# Patient Record
Sex: Female | Born: 1935 | Race: White | Hispanic: No | Marital: Single | Smoking: Never smoker
Health system: Southern US, Community
[De-identification: ages and names within clinical notes are randomized; demographics above are authoritative.]

## PROBLEM LIST (undated history)

## (undated) DIAGNOSIS — H269 Unspecified cataract: Secondary | ICD-10-CM

## (undated) DIAGNOSIS — F419 Anxiety disorder, unspecified: Secondary | ICD-10-CM

## (undated) DIAGNOSIS — I10 Essential (primary) hypertension: Secondary | ICD-10-CM

## (undated) DIAGNOSIS — I5030 Unspecified diastolic (congestive) heart failure: Secondary | ICD-10-CM

## (undated) DIAGNOSIS — I4891 Unspecified atrial fibrillation: Secondary | ICD-10-CM

## (undated) HISTORY — DX: Unspecified cataract: H26.9

## (undated) HISTORY — PX: CATARACT EXTRACTION: SUR2

## (undated) HISTORY — DX: Essential (primary) hypertension: I10

## (undated) HISTORY — DX: Anxiety disorder, unspecified: F41.9

---

## 2005-04-17 ENCOUNTER — Ambulatory Visit: Payer: Self-pay | Admitting: Ophthalmology

## 2017-07-21 ENCOUNTER — Emergency Department
Admission: EM | Admit: 2017-07-21 | Discharge: 2017-07-21 | Disposition: A | Discharge status: Home / Self Care | Payer: Medicare Other | Attending: Emergency Medicine | Admitting: Emergency Medicine

## 2017-07-21 ENCOUNTER — Emergency Department: Payer: Medicare Other

## 2017-07-21 ENCOUNTER — Encounter: Payer: Self-pay | Admitting: Emergency Medicine

## 2017-07-21 DIAGNOSIS — Z87891 Personal history of nicotine dependence: Secondary | ICD-10-CM | POA: Insufficient documentation

## 2017-07-21 DIAGNOSIS — I10 Essential (primary) hypertension: Secondary | ICD-10-CM | POA: Diagnosis not present

## 2017-07-21 DIAGNOSIS — R42 Dizziness and giddiness: Secondary | ICD-10-CM | POA: Diagnosis present

## 2017-07-21 LAB — URINALYSIS, COMPLETE (UACMP) WITH MICROSCOPIC
BACTERIA UA: NONE SEEN
Bilirubin Urine: NEGATIVE
Glucose, UA: NEGATIVE mg/dL
Hgb urine dipstick: NEGATIVE
Ketones, ur: NEGATIVE mg/dL
Nitrite: NEGATIVE
PROTEIN: NEGATIVE mg/dL
SPECIFIC GRAVITY, URINE: 1.006 (ref 1.005–1.030)
pH: 7 (ref 5.0–8.0)

## 2017-07-21 LAB — BASIC METABOLIC PANEL
ANION GAP: 10 (ref 5–15)
BUN: 23 mg/dL — ABNORMAL HIGH (ref 6–20)
CALCIUM: 10.1 mg/dL (ref 8.9–10.3)
CHLORIDE: 104 mmol/L (ref 101–111)
CO2: 25 mmol/L (ref 22–32)
Creatinine, Ser: 0.81 mg/dL (ref 0.44–1.00)
GFR calc Af Amer: >60 mL/min (ref >60)
GFR calc non Af Amer: >60 mL/min (ref >60)
GLUCOSE: 123 mg/dL — AB (ref 65–99)
POTASSIUM: 3.7 mmol/L (ref 3.5–5.1)
Sodium: 139 mmol/L (ref 135–145)

## 2017-07-21 LAB — CBC
HEMATOCRIT: 43.7 % (ref 35.0–47.0)
HEMOGLOBIN: 15 g/dL (ref 12.0–16.0)
MCH: 30.8 pg (ref 26.0–34.0)
MCHC: 34.3 g/dL (ref 32.0–36.0)
MCV: 89.6 fL (ref 80.0–100.0)
Platelets: 184 10*3/uL (ref 150–440)
RBC: 4.88 MIL/uL (ref 3.80–5.20)
RDW: 14.3 % (ref 11.5–14.5)
WBC: 6.9 10*3/uL (ref 3.6–11.0)

## 2017-07-21 MED ORDER — HYDROCHLOROTHIAZIDE 25 MG PO TABS
25.0000 mg | ORAL_TABLET | Freq: Every day | ORAL | Status: DC
  Administered 2017-07-21: 25 mg via ORAL
  Filled 2017-07-21: qty 1

## 2017-07-21 MED ORDER — HYDROCHLOROTHIAZIDE 25 MG PO TABS: 25.0000 mg | ORAL_TABLET | Freq: Every day | ORAL | 1 refills | Status: DC

## 2017-07-21 NOTE — ED Provider Notes (Signed)
[facility] Emergency Department Provider Note   ____________________________________________   First MD Initiated Contact with Patient 07/21/17 Bosie Helper     (approximate)  I have reviewed the triage vital signs and the nursing notes.   HISTORY  Chief Complaint Dizziness  HPI Marissa Gilmore is a 83 y.o. female Comes over from [facility]. She has been having episodes of dizziness lightheadedness and unsteadiness. Her blood pressure was well over 200 and so she came here. After resting here her blood pressure went down to 199/124. Patient had more dizziness Sunday she said. Really not much dizziness today.. She is not dizzy now. .She has no headache she has no chest pain. She has no nausea or vomiting. She feels well at present he has not been to the doctor formany years   History reviewed. No pertinent past medical history.  There are no active problems to display for this patient.   Past Surgical History:  Procedure Laterality Date  . EYE SURGERY      Prior to Admission medications   Medication Sig Start Date End Date Taking? Authorizing Provider  hydrochlorothiazide (HYDRODIURIL) 25 MG tablet Take 1 tablet (25 mg total) by mouth daily. 07/21/17   Nena Polio, MD    Allergies Patient has no known allergies.  No family history on file.  Social History Social History  Substance Use Topics  . Smoking status: Former Research scientist (life sciences)  . Smokeless tobacco: Never Used  . Alcohol use No    Review of Systems  Constitutional: No fever/chills Eyes: No visual changes. ENT: No sore throat. Cardiovascular: Denies chest pain. Respiratory: Denies shortness of breath. Gastrointestinal: No abdominal pain.  No nausea, no vomiting.  No diarrhea.  No constipation. Genitourinary: Negative for dysuria. Musculoskeletal: Negative for back pain. Skin: Negative for rash. Neurological: Negative for headaches, focal weakness or  numbness.   ____________________________________________   PHYSICAL EXAM:  VITAL SIGNS: ED Triage Vitals [07/21/17 1611]  Enc Vitals Group     BP (!) 212/77     Pulse Rate 97     Resp 17     Temp 98.6 F (37 C)     Temp Source Oral     SpO2 99 %     Weight 148 lb (67.1 kg)     Height 5\' 4"  (1.626 m)     Head Circumference      Peak Flow      Pain Score      Pain Loc      Pain Edu?      Excl. in [place]?     Constitutional: Alert and oriented. Well appearing and in no acute distress. Eyes: Conjunctivae are normal. PERRL. EOMI.unable to see fundi Head: Atraumatic. Nose: No congestion/rhinnorhea. Mouth/Throat: Mucous membranes are moist.  Oropharynx non-erythematous. Neck: No stridor.  Cardiovascular: Normal rate, regular rhythm. Grossly normal heart sounds.  Good peripheral circulation. Respiratory: Normal respiratory effort.  No retractions. Lungs CTAB. Gastrointestinal: Soft and nontender. No distention. No abdominal bruits. No CVA tenderness. Musculoskeletal: No lower extremity tenderness nor edema.  No joint effusions. Neurologic:  Normal speech and language. No gross focal neurologic deficits are appreciated. No gait instability. Skin:  Skin is warm, dry and intact. No rash noted. Psychiatric: Mood and affect are normal. Speech and behavior are normal.  ____________________________________________   LABS (all labs ordered are listed, but only abnormal results are displayed)  Labs Reviewed  BASIC METABOLIC PANEL - Abnormal; Notable for the following:  Result Value   Glucose, Bld 123 (*)    BUN 23 (*)    All other components within normal limits  URINALYSIS, COMPLETE (UACMP) WITH MICROSCOPIC - Abnormal; Notable for the following:    Color, Urine STRAW (*)    APPearance CLEAR (*)    Leukocytes, UA MODERATE (*)    Squamous Epithelial / LPF 0-5 (*)    All other components within normal limits  CBC  CBG MONITORING, ED    ____________________________________________  EKG  EKG read and interpreted by me shows normal sinus rhythm at 97. Normal axis PVCs and LVH ____________________________________________  RADIOLOGY  Ct Head Wo Contrast  Result Date: 07/21/2017 CLINICAL DATA:  High blood pressure and dizziness intermittently for several weeks EXAM: CT HEAD WITHOUT CONTRAST TECHNIQUE: Contiguous axial images were obtained from the base of the skull through the vertex without intravenous contrast. COMPARISON:  None. FINDINGS: Brain: No acute territorial infarction, hemorrhage, or intracranial mass is seen. Small focal hypodensities within the bilateral Kempker matter may reflect old lacunar infarcts. Patchy subcortical and periventricular Raby matter hypodensity consistent with small vessel change. Mild atrophy. Nonenlarged ventricles. Vascular: No hyperdense vessels.  Carotid artery calcification. Skull: No fracture or suspicious focal lesion Sinuses/Orbits: No acute finding. Other: None IMPRESSION: 1. No CT evidence for acute intracranial abnormality. 2. Mild small vessel ischemic changes of the Saiki matter and atrophy. Electronically Signed   By: Donavan Foil M.D.   On: 07/21/2017 19:19    ____________________________________________   PROCEDURES  Procedure(s) performed:  Procedures  Critical Care performed:   ____________________________________________   INITIAL IMPRESSION / ASSESSMENT AND PLAN / ED COURSE  Pertinent labs & imaging results that were available during my care of the patient were reviewed by me and considered in my medical decision making (see chart for details).        ____________________________________________   FINAL CLINICAL IMPRESSION(S) / ED DIAGNOSES  Final diagnoses:  Hypertension, unspecified type      NEW MEDICATIONS STARTED DURING THIS VISIT:  New Prescriptions   HYDROCHLOROTHIAZIDE (HYDRODIURIL) 25 MG TABLET    Take 1 tablet (25 mg total) by mouth  daily.     Note:  This document was prepared using Dragon voice recognition software and may include unintentional dictation errors.    Nena Polio, MD 07/21/17 2036

## 2017-07-21 NOTE — ED Notes (Addendum)
Blood pressure rechecked and was 225/57 highest pulse ox reading was 102

## 2017-07-21 NOTE — Discharge Instructions (Signed)
Take the hydrochlorothiazide 1 pill a Day. Rest and take it easy tomorrow and the next day.  Follow-up with her know to clinic acute care in a few days. Return if you have more dizziness and return at once if you have any other symptoms like a bad headache or numbness or weakness

## 2017-07-21 NOTE — ED Triage Notes (Signed)
Pt sent  Over from [facility] for further eval of high blood pressure and dizziness that has been going on for a couple of weeks off and on.

## 2017-07-29 ENCOUNTER — Encounter: Payer: Self-pay | Admitting: Family Medicine

## 2017-07-29 ENCOUNTER — Ambulatory Visit (INDEPENDENT_AMBULATORY_CARE_PROVIDER_SITE_OTHER): Payer: Medicare Other | Admitting: Family Medicine

## 2017-07-29 VITALS — BP 190/96 | HR 95 | Temp 98.2°F | Ht 64.0 in | Wt 142.0 lb

## 2017-07-29 DIAGNOSIS — I10 Essential (primary) hypertension: Secondary | ICD-10-CM

## 2017-07-29 DIAGNOSIS — I119 Hypertensive heart disease without heart failure: Secondary | ICD-10-CM | POA: Diagnosis not present

## 2017-07-29 MED ORDER — LISINOPRIL 2.5 MG PO TABS: 2.5000 mg | ORAL_TABLET | Freq: Every day | ORAL | 1 refills | Status: DC

## 2017-07-29 NOTE — Progress Notes (Signed)
Patient: Marissa Gilmore Female    DOB: 1930-12-07   83 y.o.   MRN: [id number] Visit Date: 07/29/2017  Today's Provider: Vernie Murders, PA   Chief Complaint  Patient presents with  . Hospitalization Follow-up   Subjective:    HPI  Marissa Gilmore is a 83 year old female who presents today to [facility] as a new patient. She will be seeing Dr. Brita Romp as a new patient, but needed a ER follow up before a new patient appointment was available with her.     Follow up ER visit Patient was seen in ER for dizziness, lightheadedness, unsteadiness, and elevated blood pressure on 07/21/17. She was treated for Hypertension. Treatment for this included started HCTZ 25 mg She reports good compliance with treatment. She reports this condition is slightly Improved.  ------------------------------------------------------------------------------------  Past Medical History:  Diagnosis Date  . Anxiety   . Cataract   . Hypertension    Past Surgical History:  Procedure Laterality Date  . EYE SURGERY     Family History  Problem Relation Age of Onset  . Heart disease Mother   . Bladder Cancer Father    No Known Allergies   Previous Medications   HYDROCHLOROTHIAZIDE (HYDRODIURIL) 25 MG TABLET    Take 1 tablet (25 mg total) by mouth daily.    Review of Systems  Constitutional: Positive for activity change and appetite change.  Respiratory: Negative.   Cardiovascular: Negative.     Social History  Substance Use Topics  . Smoking status: Former Research scientist (life sciences)  . Smokeless tobacco: Never Used  . Alcohol use No   Objective:   BP (!) 200/94 (BP Location: Right Arm, Patient Position: Sitting, Cuff Size: Normal)   Pulse 95   Temp 98.2 F (36.8 C) (Oral)   Ht 5\' 4"  (1.626 m)   Wt 142 lb (64.4 kg)   SpO2 98%   BMI 24.37 kg/m   Physical Exam  Constitutional: She is oriented to person, place, and time. She appears well-developed and well-nourished.  HENT:  Head: Normocephalic and  atraumatic.  Right Ear: External ear normal.  Left Ear: External ear normal.  Nose: Nose normal.  Mouth/Throat: Oropharynx is clear and moist.  Eyes: Pupils are equal, round, and reactive to light. Conjunctivae and EOM are normal. Right eye exhibits no discharge.  Neck: Normal range of motion. Neck supple. No tracheal deviation present. No thyromegaly present.  Cardiovascular: Normal rate, regular rhythm, normal heart sounds and intact distal pulses.   No murmur heard. Pulmonary/Chest: Effort normal and breath sounds normal. No respiratory distress. She has no wheezes. She has no rales. She exhibits no tenderness.  Abdominal: Soft. She exhibits no distension and no mass. There is no tenderness. There is no rebound and no guarding.  Musculoskeletal: Normal range of motion. She exhibits no edema or tenderness.  Prominent/enlarged right first Steen joint. Good grip and fair ROM throughout all joints. No edema. Pulses symmetric.  Lymphadenopathy:    She has no cervical adenopathy.  Neurological: She is alert and oriented to person, place, and time. She has normal reflexes. No cranial nerve deficit. She exhibits normal muscle tone. Coordination normal.  Skin: Skin is warm and dry. No rash noted. No erythema.  Psychiatric: She has a normal mood and affect. Her behavior is normal. Judgment and thought content normal.      Assessment & Plan:     1. Essential hypertension Developed light headed sensation over the past month and went to the [facility] outpatient  clinic on 07-21-17. Was transferred to the ER by wheelchair because of increase in heart rate and found to have BP 212/77 with pulse 97. EKG showed PVC's and LVH. CT scan of head was negative for acute intracranial abnormality. Creatinine was 0.81, glucose 123, K+ 3.7, Na+ 139, Cl 104, CO2 25, anion gap 10 with GFR >60. She was started on HCTZ 25 mg qd and find BP only slightly better. With LVH and persistent BP elevation (200/94 today), will add  Lisinopril 2.5 mg qd. Recheck BP in 2 weeks. Given recommendations to restrict sodium intake, lower fats in diet and drink adequate amounts of water. States she is feeling better today than when she went to the ER.  - lisinopril (PRINIVIL,ZESTRIL) 2.5 MG tablet; Take 1 tablet (2.5 mg total) by mouth daily.  Dispense: 30 tablet; Refill: 1  2. Hypertensive left ventricular hypertrophy, without heart failure Rare PVC today with no chest pain, dyspnea, diaphoresis or peripheral edema. EKG's in the ER on 07-21-17 showed HR 97 with sinus rhythm, frequent PVC's and LVH. Feeling better today. Will add small dose of ACE-I and recommend she proceed with follow up appointment with Dr. Brita Romp. - lisinopril (PRINIVIL,ZESTRIL) 2.5 MG tablet; Take 1 tablet (2.5 mg total) by mouth daily.  Dispense: 30 tablet; Refill: 1

## 2017-07-30 ENCOUNTER — Ambulatory Visit: Payer: Self-pay | Admitting: Physician Assistant

## 2017-08-22 ENCOUNTER — Ambulatory Visit (INDEPENDENT_AMBULATORY_CARE_PROVIDER_SITE_OTHER): Payer: Medicare Other | Admitting: Family Medicine

## 2017-08-22 ENCOUNTER — Ambulatory Visit: Payer: Self-pay | Admitting: Family Medicine

## 2017-08-22 ENCOUNTER — Encounter: Payer: Self-pay | Admitting: Family Medicine

## 2017-08-22 VITALS — BP 176/82 | HR 100 | Temp 98.2°F | Resp 16 | Ht 64.0 in | Wt 141.0 lb

## 2017-08-22 DIAGNOSIS — I10 Essential (primary) hypertension: Secondary | ICD-10-CM | POA: Insufficient documentation

## 2017-08-22 DIAGNOSIS — R5383 Other fatigue: Secondary | ICD-10-CM

## 2017-08-22 DIAGNOSIS — I119 Hypertensive heart disease without heart failure: Secondary | ICD-10-CM | POA: Diagnosis not present

## 2017-08-22 LAB — LIPID PANEL
Cholesterol: 272 mg/dL — ABNORMAL HIGH (ref <200)
HDL: 64 mg/dL (ref >50)
LDL CHOLESTEROL (CALC): 175 mg/dL — AB
Non-HDL Cholesterol (Calc): 208 mg/dL (calc) — ABNORMAL HIGH (ref <130)
Total CHOL/HDL Ratio: 4.3 (calc) (ref <5.0)
Triglycerides: 180 mg/dL — ABNORMAL HIGH (ref <150)

## 2017-08-22 LAB — COMPLETE METABOLIC PANEL WITH GFR
AG Ratio: 1.8 (calc) (ref 1.0–2.5)
ALBUMIN MSPROF: 4.2 g/dL (ref 3.6–5.1)
ALKALINE PHOSPHATASE (APISO): 72 U/L (ref 33–130)
ALT: 13 U/L (ref 6–29)
AST: 12 U/L (ref 10–35)
BUN / CREAT RATIO: 19 (calc) (ref 6–22)
BUN: 19 mg/dL (ref 7–25)
CO2: 28 mmol/L (ref 20–32)
CREATININE: 1 mg/dL — AB (ref 0.60–0.88)
Calcium: 10 mg/dL (ref 8.6–10.4)
Chloride: 106 mmol/L (ref 98–110)
GFR, Est African American: 60 mL/min/{1.73_m2} (ref >=60)
GFR, Est Non African American: 52 mL/min/{1.73_m2} — ABNORMAL LOW (ref >=60)
GLUCOSE: 113 mg/dL — AB (ref 65–99)
Globulin: 2.3 g/dL (calc) (ref 1.9–3.7)
Potassium: 3.9 mmol/L (ref 3.5–5.3)
Sodium: 143 mmol/L (ref 135–146)
Total Bilirubin: 0.4 mg/dL (ref 0.2–1.2)
Total Protein: 6.5 g/dL (ref 6.1–8.1)

## 2017-08-22 MED ORDER — HYDROCHLOROTHIAZIDE 25 MG PO TABS: 25.0000 mg | ORAL_TABLET | Freq: Every day | ORAL | 1 refills | Status: DC

## 2017-08-22 MED ORDER — LISINOPRIL 5 MG PO TABS: 5.0000 mg | ORAL_TABLET | Freq: Every day | ORAL | 1 refills | Status: DC

## 2017-08-22 NOTE — Progress Notes (Signed)
Patient: Marissa Gilmore Female    DOB: 1931/01/25   83 y.o.   MRN: [id number] Visit Date: 08/22/2017  Today's Provider: Lavon Paganini, MD   Chief Complaint  Patient presents with  . Hypertension   Subjective:    HPI  Hypertension, follow-up:  BP Readings from Last 3 Encounters:  08/22/17 (!) 176/82  07/29/17 (!) 190/96  07/21/17 (!) 196/68    She was last seen for hypertension 1 months ago.  BP at that visit was 190/96. Management since that visit includes adding Lisinopril 2.5mg  and continue HCTZ 25mg  daily. She reports good compliance with treatment.  Did run out of HCTZ yesterday, so did not take this morning. She is not having side effects.  She is exercising. She is adherent to low salt diet.   Outside blood pressures are checked daily - states they are running 017 systolic She is experiencing fatigue.  Patient denies exertional chest pressure/discomfort, lower extremity edema and palpitations.    Weight trend: stable Wt Readings from Last 3 Encounters:  08/22/17 141 lb (64 kg)  07/29/17 142 lb (64.4 kg)  07/21/17 148 lb (67.1 kg)    Current diet: well balanced   Patient reports that she has decreased energy since she has been in the hospital. She is unsure if it is because her BP is so high, or if it is a side effect from the medications.  Also lost appetite initially, but this has improved.  States that she is having to cancel all of her plans recently.     No Known Allergies   Current Outpatient Prescriptions:  .  hydrochlorothiazide (HYDRODIURIL) 25 MG tablet, Take 1 tablet (25 mg total) by mouth daily., Disp: 30 tablet, Rfl: 1 .  lisinopril (PRINIVIL,ZESTRIL) 2.5 MG tablet, Take 1 tablet (2.5 mg total) by mouth daily., Disp: 30 tablet, Rfl: 1  Review of Systems  Constitutional: Positive for activity change and fatigue. Negative for appetite change, chills and fever.  HENT: Negative.   Eyes: Negative.   Respiratory: Negative.     Cardiovascular: Negative for chest pain, palpitations and leg swelling.  Gastrointestinal: Negative.   Endocrine: Negative.   Genitourinary: Negative.   Musculoskeletal: Negative.   Skin: Negative.   Neurological: Negative.   Hematological: Negative.   Psychiatric/Behavioral: Negative.    Past Medical History:  Diagnosis Date  . Anxiety   . Cataract   . Hypertension    Past Surgical History:  Procedure Laterality Date  . CATARACT EXTRACTION Right     Family History  Problem Relation Age of Onset  . Heart disease Mother        no MI  . Peripheral Artery Disease Mother 18  . Bladder Cancer Father 68    Social History  Substance Use Topics  . Smoking status: Former Research scientist (life sciences)  . Smokeless tobacco: Never Used  . Alcohol use No   Objective:   BP (!) 176/82 (BP Location: Left Arm, Patient Position: Sitting, Cuff Size: Normal)   Pulse 100   Temp 98.2 F (36.8 C)   Resp 16   Ht 5\' 4"  (1.626 m)   Wt 141 lb (64 kg)   BMI 24.20 kg/m  Vitals:   08/22/17 1506  BP: (!) 176/82  Pulse: 100  Resp: 16  Temp: 98.2 F (36.8 C)  Weight: 141 lb (64 kg)  Height: 5\' 4"  (1.626 m)     Physical Exam  Constitutional: She is oriented to person, place, and time. She appears well-developed and  well-nourished. No distress.  HENT:  Head: Normocephalic and atraumatic.  Right Ear: External ear normal.  Left Ear: External ear normal.  Nose: Nose normal.  Mouth/Throat: Oropharynx is clear and moist.  Eyes: Pupils are equal, round, and reactive to light. Conjunctivae are normal. No scleral icterus.  Neck: Neck supple. No thyromegaly present.  Cardiovascular: Normal rate, regular rhythm, normal heart sounds and intact distal pulses.   No murmur heard. Pulmonary/Chest: Effort normal and breath sounds normal. No respiratory distress. She has no wheezes. She has no rales.  Abdominal: Soft. Bowel sounds are normal. She exhibits no distension. There is no tenderness. There is no rebound and  no guarding.  Musculoskeletal: She exhibits no edema or deformity.  Lymphadenopathy:    She has no cervical adenopathy.  Neurological: She is alert and oriented to person, place, and time. No cranial nerve deficit.  Skin: Skin is warm and dry. No rash noted.  Psychiatric: She has a normal mood and affect. Her behavior is normal.  Vitals reviewed.       Assessment & Plan:      Problem List Items Addressed This Visit      Cardiovascular and Mediastinum   Hypertension - Primary    Poorly controlled, but has improved from last office visit with the addition of lisinopril Suspect that her fatigue is more likely related to her hypertension then her antihypertensives Continue HCTZ at current dose Increase lisinopril to 5 mg daily Continues check blood pressures at home daily Recheck CMP today as she is recently started an ACE inhibitor Screening lipid panel Patient to call in 2 weeks with her blood pressure log and we will consider increasing lisinopril dose to 10 mg daily at that time Follow-up in 6 weeks for further adjustment for blood pressure control      Relevant Medications   lisinopril (PRINIVIL,ZESTRIL) 5 MG tablet   hydrochlorothiazide (HYDRODIURIL) 25 MG tablet   Other Relevant Orders   Lipid panel   COMPLETE METABOLIC PANEL WITH GFR     Other   Fatigue    Suspect this is most likely related to her uncontrolled hypertension and not to her medications We discussed the we'll continue to monitor until we get good control of her blood pressure       Other Visit Diagnoses    Hypertensive left ventricular hypertrophy, without heart failure       Relevant Medications   lisinopril (PRINIVIL,ZESTRIL) 5 MG tablet   hydrochlorothiazide (HYDRODIURIL) 25 MG tablet      Return in about 6 weeks (around 10/03/2017) for BP f/u.     The entirety of the information documented in the History of Present Illness, Review of Systems and Physical Exam were personally obtained by me.  Portions of this information were initially documented by Wilburt Finlay, CMA and reviewed by me for thoroughness and accuracy.    Lavon Paganini, MD  [facility]

## 2017-08-22 NOTE — Patient Instructions (Signed)

## 2017-08-22 NOTE — Assessment & Plan Note (Signed)
Suspect this is most likely related to her uncontrolled hypertension and not to her medications We discussed the we'll continue to monitor until we get good control of her blood pressure

## 2017-08-22 NOTE — Assessment & Plan Note (Signed)
Poorly controlled, but has improved from last office visit with the addition of lisinopril Suspect that her fatigue is more likely related to her hypertension then her antihypertensives Continue HCTZ at current dose Increase lisinopril to 5 mg daily Continues check blood pressures at home daily Recheck CMP today as she is recently started an ACE inhibitor Screening lipid panel Patient to call in 2 weeks with her blood pressure log and we will consider increasing lisinopril dose to 10 mg daily at that time Follow-up in 6 weeks for further adjustment for blood pressure control

## 2017-08-29 ENCOUNTER — Telehealth: Payer: Self-pay

## 2017-08-29 NOTE — Telephone Encounter (Signed)
-----   Message from Virginia Crews, MD sent at 08/25/2017  9:17 AM EDT ----- Cholesterol is high.  Would recommend starting a statin medication for lower cholesterol and heart disease/stroke risk.  We can start this now or discuss further at f/u visit if patient wishes.  Kidney function has decreased slightly from last check.  Be sure to stay hydrated.  We will recheck at next visit.  Virginia Crews, MD, MPH [facility] 08/25/2017 9:17 AM

## 2017-08-29 NOTE — Telephone Encounter (Signed)
Patient advised as directed below. Per patient will wait until the next follow-up appointment to discuss.  Thanks,  -Zavia Pullen

## 2017-09-08 ENCOUNTER — Telehealth: Payer: Self-pay | Admitting: Family Medicine

## 2017-09-08 NOTE — Telephone Encounter (Signed)
Pt was calling with BP logs. BP has improved, but SBP is running in the 150's. DBP is running from the 70's-80's. Per LOV note on 08/22/2017, Lisinopril was increased from 2.5 mg po qd to 5 mg po qd. Pt was to call with BP log, and PCP was going to consider increasing Lisinopril to 10 mg po qd. She states the medication is causing fatigue. States, "I have no energy". Please advise.

## 2017-09-08 NOTE — Telephone Encounter (Signed)
Pt is requesting Dr. B to return her call. Pt stated she was advised to call Dr. B this week to update her on status. Please advise. Thanks TNP

## 2017-09-09 ENCOUNTER — Other Ambulatory Visit: Payer: Self-pay | Admitting: Family Medicine

## 2017-09-09 MED ORDER — LISINOPRIL 10 MG PO TABS: 10.0000 mg | ORAL_TABLET | Freq: Every day | ORAL | 3 refills | Status: DC

## 2017-09-09 NOTE — Telephone Encounter (Signed)
Pt advised and agrees with treatment plan. 

## 2017-09-09 NOTE — Telephone Encounter (Signed)
Please advice patient to increase lisinopril to 10mg  daily.  I will send a new Rx to pharmacy.  She can take 2 of the 5mg  pills together until she runs out, though.  Let's get BP well controlled and see how fatigue is then.    Virginia Crews, MD, MPH [facility] 09/09/2017 8:10 AM

## 2017-10-03 ENCOUNTER — Ambulatory Visit (INDEPENDENT_AMBULATORY_CARE_PROVIDER_SITE_OTHER): Payer: Medicare Other | Admitting: Family Medicine

## 2017-10-03 VITALS — BP 162/80 | HR 72 | Temp 98.3°F | Resp 16 | Wt 143.0 lb

## 2017-10-03 DIAGNOSIS — I10 Essential (primary) hypertension: Secondary | ICD-10-CM | POA: Diagnosis not present

## 2017-10-03 DIAGNOSIS — M7712 Lateral epicondylitis, left elbow: Secondary | ICD-10-CM | POA: Diagnosis not present

## 2017-10-03 DIAGNOSIS — J309 Allergic rhinitis, unspecified: Secondary | ICD-10-CM | POA: Insufficient documentation

## 2017-10-03 DIAGNOSIS — R5383 Other fatigue: Secondary | ICD-10-CM | POA: Diagnosis not present

## 2017-10-03 DIAGNOSIS — J302 Other seasonal allergic rhinitis: Secondary | ICD-10-CM

## 2017-10-03 MED ORDER — FLUTICASONE PROPIONATE 50 MCG/ACT NA SUSP: 2.0000 | Freq: Every day | NASAL | 6 refills | Status: DC

## 2017-10-03 MED ORDER — DICLOFENAC SODIUM 1 % TD GEL: 2.0000 g | Freq: Four times a day (QID) | TRANSDERMAL | 2 refills | Status: DC | PRN

## 2017-10-03 MED ORDER — LISINOPRIL 20 MG PO TABS: 20.0000 mg | ORAL_TABLET | Freq: Every day | ORAL | 2 refills | Status: DC

## 2017-10-03 NOTE — Assessment & Plan Note (Signed)
Remains uncontrolled but improving Fatigue is also improving, so suspect this is related to elevated BP as well rather than her antihypertensives Continue HCTZ at current dose Increase lisinopril to 20mg  daily Continue home BP checks Repeat BMP as Cr was slightly elevated at last check F/u in 6 wks

## 2017-10-03 NOTE — Assessment & Plan Note (Signed)
Symptoms c/w lateral epicondylitis  Treat with Voltaren gel prn Given HEP F/u prn

## 2017-10-03 NOTE — Progress Notes (Signed)
Patient: Marissa Gilmore Female    DOB: [birth year]   83 y.o.   MRN: [id number] Visit Date: 10/03/2017  Today's Provider: Lavon Paganini, MD   Chief Complaint  Patient presents with  . Hypertension   Subjective:    HPI      Hypertension, follow-up:  BP Readings from Last 3 Encounters:  10/03/17 (!) 162/80  08/22/17 (!) 176/82  07/29/17 (!) 190/96    She was last seen for hypertension 6 weeks ago.  BP at that visit was 176/82. Management since that visit includes continuing HCTZ and increasing Lisinopril to 5 mg. Pt was later increased to 10 mg po qd per pt message from 09/08/2017. She reports excellent compliance with treatment. She is not having side effects.  She is not exercising. She is adherent to low salt diet.   Outside blood pressures are ranging from 140's-170's/70's-90's. She is experiencing fatigue.  Still present in the mornings, but better through the day.  This has improved greatly since last visit. Patient denies chest pain, chest pressure/discomfort, claudication, dyspnea, exertional chest pressure/discomfort, irregular heart beat, lower extremity edema, near-syncope, orthopnea, palpitations and syncope.   Cardiovascular risk factors include advanced age (older than 49 for men, 9 for women) and hypertension.  Use of agents associated with hypertension: none.     Weight trend: stable Wt Readings from Last 3 Encounters:  10/03/17 143 lb (64.9 kg)  08/22/17 141 lb (64 kg)  07/29/17 142 lb (64.4 kg)    Current diet: in general, a "healthy" diet    ------------------------------------------------------------------------ Pt is c/o post nasal drainage, rhinorrhea, nasal congestion. She is requesting a medication, possibly nasal spray, to help with this.  States this is seasonal and typical for fall.  L elbow pain on lateral epicondyle: pain intermittently over last several months. Does not recall injury.  Hasn't tried any medications or ice.   Worse with elbow movement.  No Known Allergies   Current Outpatient Medications:  .  hydrochlorothiazide (HYDRODIURIL) 25 MG tablet, Take 1 tablet (25 mg total) by mouth daily., Disp: 30 tablet, Rfl: 1 .  lisinopril (PRINIVIL,ZESTRIL) 20 MG tablet, Take 1 tablet (20 mg total) by mouth daily., Disp: 30 tablet, Rfl: 2 .  diclofenac sodium (VOLTAREN) 1 % GEL, Apply 2 g topically 4 (four) times daily as needed (pain)., Disp: 100 g, Rfl: 2 .  fluticasone (FLONASE) 50 MCG/ACT nasal spray, Place 2 sprays into both nostrils daily., Disp: 16 g, Rfl: 6  Review of Systems  Constitutional: Positive for fatigue. Negative for activity change, appetite change, chills, diaphoresis, fever and unexpected weight change.  HENT: Positive for postnasal drip.   Respiratory: Positive for cough and choking (from PND).   Cardiovascular: Negative for chest pain, palpitations and leg swelling.    Social History   Tobacco Use  . Smoking status: Never Smoker  . Smokeless tobacco: Never Used  Substance Use Topics  . Alcohol use: No   Objective:   BP (!) 162/80 (BP Location: Left Arm, Patient Position: Sitting, Cuff Size: Normal)   Pulse 72   Temp 98.3 F (36.8 C) (Oral)   Resp 16   Wt 143 lb (64.9 kg)   BMI 24.55 kg/m  Vitals:   10/03/17 1453  BP: (!) 162/80  Pulse: 72  Resp: 16  Temp: 98.3 F (36.8 C)  TempSrc: Oral  Weight: 143 lb (64.9 kg)     Physical Exam  Constitutional: She is oriented to person, place, and time. She  appears well-developed and well-nourished. No distress.  HENT:  Head: Normocephalic and atraumatic.  Right Ear: External ear normal.  Left Ear: External ear normal.  Nose: Nose normal.  Mouth/Throat: Oropharynx is clear and moist.  Eyes: Conjunctivae are normal. No scleral icterus.  Neck: Neck supple. No thyromegaly present.  Pulmonary/Chest: Effort normal and breath sounds normal. No respiratory distress. She has no wheezes. She has no rales.  Musculoskeletal: She  exhibits no edema or deformity.  L elbow: ROM intact, no deformities or abnormalities on inspection or palpation. No TTP but states it is typically painful over lateral epicondyle. Strength and sensaiton intact  Lymphadenopathy:    She has no cervical adenopathy.  Neurological: She is alert and oriented to person, place, and time.  Skin: Skin is warm and dry. No rash noted.  Psychiatric: She has a normal mood and affect. Her behavior is normal.  Vitals reviewed.       Assessment & Plan:      Problem List Items Addressed This Visit      Cardiovascular and Mediastinum   Hypertension - Primary    Remains uncontrolled but improving Fatigue is also improving, so suspect this is related to elevated BP as well rather than her antihypertensives Continue HCTZ at current dose Increase lisinopril to 20mg  daily Continue home BP checks Repeat BMP as Cr was slightly elevated at last check F/u in 6 wks      Relevant Medications   lisinopril (PRINIVIL,ZESTRIL) 20 MG tablet   Other Relevant Orders   BASIC METABOLIC PANEL WITH GFR     Respiratory   Allergic rhinitis    Trial of flonase for seasonal symptoms        Musculoskeletal and Integument   Lateral epicondylitis of left elbow    Symptoms c/w lateral epicondylitis  Treat with Voltaren gel prn Given HEP F/u prn        Other   Fatigue    Improving Suspect this is most likely related to uncontrolled HTN rather than medications Recent CBC wnl Check TSH Continue to monitor      Relevant Orders   TSH      Return in about 6 weeks (around 11/14/2017) for BP f/u.     The entirety of the information documented in the History of Present Illness, Review of Systems and Physical Exam were personally obtained by me. Portions of this information were initially documented by Raquel Sarna Ratchford, CMA and reviewed by me for thoroughness and accuracy.     Lavon Paganini, MD  [facility]

## 2017-10-03 NOTE — Patient Instructions (Signed)
Tennis Elbow Tennis elbow (lateral epicondylitis) is inflammation of the outer tendons of your forearm close to your elbow. Your tendons attach your muscles to your bones. The outer tendons of your forearm are used to extend your wrist, and they attach on the outside part of your elbow. Tennis elbow is often found in people who play tennis, but anyone may get the condition from repeatedly extending the wrist or turning the forearm. What are the causes? This condition is caused by repeatedly extending your wrist and using your hands. It can result from sports or work that requires repetitive forearm movements. Tennis elbow may also be caused by an injury. What increases the risk? You have a higher risk of developing tennis elbow if you play tennis or another racquet sport. You also have a higher risk if you frequently use your hands for work. This condition is also more likely to develop in:  Musicians.  Carpenters, painters, and plumbers.  Cooks.  Cashiers.  People who work in factories.  Construction workers.  Butchers.  People who use computers.  What are the signs or symptoms? Symptoms of this condition include:  Pain and tenderness in your forearm and the outer part of your elbow. You may only feel the pain when you use your arm, or you may feel it even when you are not using your arm.  A burning feeling that runs from your elbow through your arm.  Weak grip in your hands.  How is this diagnosed? This condition may be diagnosed by medical history and physical exam. You may also have other tests, including:  X-rays.  MRI.  How is this treated? Your health care provider will recommend lifestyle adjustments, such as resting and icing your arm. Treatment may also include:  Medicines for inflammation. This may include shots of cortisone if your pain continues.  Physical therapy. This may include massage or exercises.  An elbow brace.  Surgery may eventually be  recommended if your pain does not go away with treatment. Follow these instructions at home: Activity  Rest your elbow and wrist as directed by your health care provider. Try to avoid any activities that caused the problem until your health care provider says that you can do them again.  If a physical therapist teaches you exercises, do all of them as directed.  If you lift an object, lift it with your palm facing upward. This lowers the stress on your elbow. Lifestyle  If your tennis elbow is caused by sports, check your equipment and make sure that: ? You are using it correctly. ? It is the best fit for you.  If your tennis elbow is caused by work, take breaks frequently, if you are able. Talk with your manager about how to best perform tasks in a way that is safe. ? If your tennis elbow is caused by computer use, talk with your manager about any changes that can be made to your work environment. General instructions  If directed, apply ice to the painful area: ? Put ice in a plastic bag. ? Place a towel between your skin and the bag. ? Leave the ice on for 20 minutes, 2-3 times per day.  Take medicines only as directed by your health care provider.  If you were given a brace, wear it as directed by your health care provider.  Keep all follow-up visits as directed by your health care provider. This is important. Contact a health care provider if:  Your pain does not   get better with treatment.  Your pain gets worse.  You have numbness or weakness in your forearm, hand, or fingers. This information is not intended to replace advice given to you by your health care provider. Make sure you discuss any questions you have with your health care provider. Document Released: 10/21/2005 Document Revised: 06/20/2016 Document Reviewed: 10/17/2014 Elsevier Interactive Patient Education  2018 [employer]vier Inc.  

## 2017-10-03 NOTE — Assessment & Plan Note (Signed)
Trial of flonase for seasonal symptoms

## 2017-10-03 NOTE — Assessment & Plan Note (Signed)
Improving Suspect this is most likely related to uncontrolled HTN rather than medications Recent CBC wnl Check TSH Continue to monitor

## 2017-10-04 LAB — BASIC METABOLIC PANEL WITH GFR
BUN: 16 mg/dL (ref 7–25)
CHLORIDE: 108 mmol/L (ref 98–110)
CO2: 27 mmol/L (ref 20–32)
Calcium: 9.9 mg/dL (ref 8.6–10.4)
Creat: 0.83 mg/dL (ref 0.60–0.88)
GFR, EST NON AFRICAN AMERICAN: 65 mL/min/{1.73_m2} (ref >=60)
GFR, Est African American: 75 mL/min/{1.73_m2} (ref >=60)
GLUCOSE: 102 mg/dL — AB (ref 65–99)
POTASSIUM: 4.1 mmol/L (ref 3.5–5.3)
SODIUM: 142 mmol/L (ref 135–146)

## 2017-10-04 LAB — TSH: TSH: 0.75 mIU/L (ref 0.40–4.50)

## 2017-10-06 ENCOUNTER — Telehealth: Payer: Self-pay

## 2017-10-06 ENCOUNTER — Telehealth: Payer: Self-pay | Admitting: Family Medicine

## 2017-10-06 NOTE — Telephone Encounter (Signed)
Pt advised and agrees with treatment plan. 

## 2017-10-06 NOTE — Telephone Encounter (Signed)
Please review

## 2017-10-06 NOTE — Telephone Encounter (Signed)
I had recommended capsaicin cream.  She can also use the Voltaren gel that was prescribed.  Virginia Crews, MD, MPH [facility] 10/06/2017 11:23 AM

## 2017-10-06 NOTE — Telephone Encounter (Signed)
Pt advised.

## 2017-10-06 NOTE — Telephone Encounter (Signed)
Pt stated at her last OV Dr. B suggested for pt to use something OTC for her leg but pt forgot what the name was. Pt request a call back. Please advise. Thanks TNP

## 2017-10-06 NOTE — Telephone Encounter (Signed)
-----   Message from Virginia Crews, MD sent at 10/06/2017  9:27 AM EST ----- Normal Thyroid function, kidney function and electrolytes.  Virginia Crews, MD, MPH [facility] 10/06/2017 9:27 AM

## 2017-11-14 ENCOUNTER — Encounter: Payer: Self-pay | Admitting: Family Medicine

## 2017-11-14 ENCOUNTER — Ambulatory Visit: Payer: Medicare Other | Admitting: Family Medicine

## 2017-11-14 VITALS — BP 158/80 | HR 90 | Temp 98.1°F | Resp 16 | Wt 140.0 lb

## 2017-11-14 DIAGNOSIS — M7632 Iliotibial band syndrome, left leg: Secondary | ICD-10-CM

## 2017-11-14 DIAGNOSIS — I10 Essential (primary) hypertension: Secondary | ICD-10-CM

## 2017-11-14 DIAGNOSIS — M7712 Lateral epicondylitis, left elbow: Secondary | ICD-10-CM | POA: Diagnosis not present

## 2017-11-14 DIAGNOSIS — M1990 Unspecified osteoarthritis, unspecified site: Secondary | ICD-10-CM | POA: Insufficient documentation

## 2017-11-14 MED ORDER — LISINOPRIL 40 MG PO TABS: 40.0000 mg | ORAL_TABLET | Freq: Every day | ORAL | 3 refills | Status: DC

## 2017-11-14 NOTE — Assessment & Plan Note (Signed)
Exam and symptoms c/w L IT band syndrome Discussed stretching, icing, leg strengthening, and NSAIDs prn HEP given with information F/u in 6 weeks Could consider PT referral if not improving with home exercises

## 2017-11-14 NOTE — Progress Notes (Signed)
Patient: Marissa Gilmore Female    DOB: 1931/06/20   84 y.o.   MRN: [id number] Visit Date: 11/14/2017  Today's Provider: Lavon Paganini, MD   I, Martha Clan, CMA, am acting as scribe for Lavon Paganini, MD.  Chief Complaint  Patient presents with  . Hypertension  . Leg Pain   Subjective:    Leg Pain   There was no injury mechanism (has been present for several months). Pain location: left thigh and popliteal. The quality of the pain is described as aching. The pain is moderate. The pain has been fluctuating since onset. Pertinent negatives include no inability to bear weight, loss of motion, loss of sensation, muscle weakness, numbness or tingling. Exacerbated by: going from sitting to standing. Treatments tried: Voltaren Gel, OTC topical analgesic. The treatment provided mild relief.   x2-3 months Worse with standing from sitting.  No nighttime pain Some days are worse than others Hasn't tried ice or stretching L leg lateral hip and knee     Hypertension, follow-up:  BP Readings from Last 3 Encounters:  11/14/17 (!) 158/80  10/03/17 (!) 162/80  08/22/17 (!) 176/82    She was last seen for hypertension 6 weeks ago.  BP at that visit was 162/80. Management since that visit includes continue HCTZ and increase Lisinopril 20 mg po qd. Labs were checked and were normal. She reports fair compliance with treatment. She states she is not taking the HCTZ. She is not having side effects.  She is not exercising. She is adherent to low salt diet.   Outside blood pressures are "all over the place". Ranging from 140-177/69-93 She is experiencing none.  Patient denies chest pain, chest pressure/discomfort, claudication, dyspnea, exertional chest pressure/discomfort, fatigue, irregular heart beat, lower extremity edema, near-syncope, orthopnea, palpitations and syncope.   Cardiovascular risk factors include advanced age (older than 60 for men, 62 for women) and  hypertension.    Weight trend: stable Wt Readings from Last 3 Encounters:  11/14/17 140 lb (63.5 kg)  10/03/17 143 lb (64.9 kg)  08/22/17 141 lb (64 kg)    Current diet: in general, a "healthy" diet    ------------------------------------------------------------------------   No Known Allergies   Current Outpatient Medications:  .  diclofenac sodium (VOLTAREN) 1 % GEL, Apply 2 g topically 4 (four) times daily as needed (pain)., Disp: 100 g, Rfl: 2 .  lisinopril (PRINIVIL,ZESTRIL) 20 MG tablet, Take 1 tablet (20 mg total) by mouth daily., Disp: 30 tablet, Rfl: 2 .  hydrochlorothiazide (HYDRODIURIL) 25 MG tablet, Take 1 tablet (25 mg total) by mouth daily. (Patient not taking: Reported on 11/14/2017), Disp: 30 tablet, Rfl: 1  Review of Systems  Constitutional: Negative for fatigue.  Respiratory: Negative for shortness of breath.   Cardiovascular: Negative for chest pain, palpitations and leg swelling.  Musculoskeletal: Positive for myalgias.  Neurological: Negative for tingling and numbness.    Social History   Tobacco Use  . Smoking status: Never Smoker  . Smokeless tobacco: Never Used  Substance Use Topics  . Alcohol use: No   Objective:   BP (!) 158/80 (BP Location: Left Arm, Patient Position: Sitting, Cuff Size: Normal)   Pulse 90   Temp 98.1 F (36.7 C) (Oral)   Resp 16   Wt 140 lb (63.5 kg)   SpO2 99%   BMI 24.03 kg/m  Vitals:   11/14/17 1611  BP: (!) 158/80  Pulse: 90  Resp: 16  Temp: 98.1 F (36.7 C)  TempSrc: Oral  SpO2: 99%  Weight: 140 lb (63.5 kg)     Physical Exam  Constitutional: She is oriented to person, place, and time. She appears well-developed and well-nourished. No distress.  HENT:  Head: Normocephalic and atraumatic.  Eyes: Conjunctivae are normal. No scleral icterus.  Neck: Neck supple.  Cardiovascular: Normal rate, regular rhythm, normal heart sounds and intact distal pulses.  No murmur heard. Pulmonary/Chest: Effort normal.  No respiratory distress. She has no wheezes. She has no rales.  Musculoskeletal: She exhibits no edema or deformity.  B/l LE strength 5/5.  No knee joint line tenderness, normal ROM.  TTP over L IT band  Lymphadenopathy:    She has no cervical adenopathy.  Neurological: She is alert and oriented to person, place, and time.  Skin: Skin is warm and dry. No rash noted.  Psychiatric: She has a normal mood and affect. Her behavior is normal.  Vitals reviewed.       Assessment & Plan:      Problem List Items Addressed This Visit      Cardiovascular and Mediastinum   Hypertension - Primary    Remains uncontrolled Turns out patient has not been taking HCTZ (stopped it when increased lisinopril Will increase Lisinopril to 40mg  (max dose) and hold HCTZ at this time If remains elevated at f/u, will need to resume some HCTZ F/u in 6wks      Relevant Medications   lisinopril (PRINIVIL,ZESTRIL) 40 MG tablet     Musculoskeletal and Integument   Lateral epicondylitis of left elbow    Somewhat improved Continue voltaren gel prn      It band syndrome, left    Exam and symptoms c/w L IT band syndrome Discussed stretching, icing, leg strengthening, and NSAIDs prn HEP given with information F/u in 6 weeks Could consider PT referral if not improving with home exercises         Return in about 6 weeks (around 12/26/2017).     The entirety of the information documented in the History of Present Illness, Review of Systems and Physical Exam were personally obtained by me. Portions of this information were initially documented by Raquel Sarna Ratchford, CMA and reviewed by me for thoroughness and accuracy.    Virginia Crews, MD, MPH [facility] 11/14/2017 4:45 PM

## 2017-11-14 NOTE — Assessment & Plan Note (Signed)
Somewhat improved Continue voltaren gel prn

## 2017-11-14 NOTE — Assessment & Plan Note (Signed)
Remains uncontrolled Turns out patient has not been taking HCTZ (stopped it when increased lisinopril Will increase Lisinopril to 40mg  (max dose) and hold HCTZ at this time If remains elevated at f/u, will need to resume some HCTZ F/u in 6wks

## 2017-12-29 ENCOUNTER — Ambulatory Visit: Payer: Medicare Other | Admitting: Family Medicine

## 2017-12-29 ENCOUNTER — Encounter: Payer: Self-pay | Admitting: Family Medicine

## 2017-12-29 VITALS — BP 156/86 | HR 85 | Temp 98.0°F | Resp 16 | Wt 140.0 lb

## 2017-12-29 DIAGNOSIS — M170 Bilateral primary osteoarthritis of knee: Secondary | ICD-10-CM

## 2017-12-29 DIAGNOSIS — I10 Essential (primary) hypertension: Secondary | ICD-10-CM

## 2017-12-29 MED ORDER — AMLODIPINE BESYLATE 5 MG PO TABS: 5.0000 mg | ORAL_TABLET | Freq: Every day | ORAL | 3 refills | Status: DC

## 2017-12-29 NOTE — Progress Notes (Signed)
Patient: Marissa Gilmore Female    DOB: July 24, 1931   84 y.o.   MRN: [id number] Visit Date: 12/29/2017  Today's Provider: Lavon Paganini, MD   I, Martha Clan, CMA, am acting as scribe for Lavon Paganini, MD.  Chief Complaint  Patient presents with  . Hypertension  . Knee Pain   Subjective:    HPI      Hypertension, follow-up:  BP Readings from Last 3 Encounters:  12/29/17 (!) 156/86  11/14/17 (!) 158/80  10/03/17 (!) 162/80    She was last seen for hypertension 6 weeks ago.  BP at that visit was 158/80. Management since that visit includes holding HCTZ (as pt was not taking taking this), and increasing Lisinopril to max dose of 40 mg. She reports good compliance with treatment. She is not having side effects.  She is not exercising. She is adherent to low salt diet.   Outside blood pressures are ranging from 140's-150's/60's-103. She is experiencing none.  Patient denies chest pain, chest pressure/discomfort, claudication, dyspnea, exertional chest pressure/discomfort, fatigue, irregular heart beat, lower extremity edema, near-syncope, orthopnea, palpitations and syncope.   Cardiovascular risk factors include advanced age (older than 24 for men, 42 for women) and hypertension.  Use of agents associated with hypertension: none.     Weight trend: stable Wt Readings from Last 3 Encounters:  12/29/17 140 lb (63.5 kg)  11/14/17 140 lb (63.5 kg)  10/03/17 143 lb (64.9 kg)    Current diet: in general, a "healthy" diet    ------------------------------------------------------------------------  Follow up for Leg Pain  The patient was last seen for this 6 weeks ago. Changes made at last visit include giving pt exercises for IT band syndrome.  She reports good compliance with treatment. She feels that condition has "changed positions". Pt states the pain is now in her knees, which she is using braces for, with relief. She is also taking Voltaren  Gel, with some relief.  Pain is exacerbated when going from sitting to standing.  Thinks she has knee arthritis.  Gets good support from knee brace In both knees L>R This is tolerable. ------------------------------------------------------------------------------------   No Known Allergies   Current Outpatient Medications:  .  diclofenac sodium (VOLTAREN) 1 % GEL, Apply 2 g topically 4 (four) times daily as needed (pain)., Disp: 100 g, Rfl: 2 .  lisinopril (PRINIVIL,ZESTRIL) 40 MG tablet, Take 1 tablet (40 mg total) by mouth daily., Disp: 30 tablet, Rfl: 3  Review of Systems  Constitutional: Negative for activity change, appetite change, chills, diaphoresis, fatigue, fever and unexpected weight change.  Respiratory: Negative.  Negative for shortness of breath.   Cardiovascular: Negative for chest pain, palpitations and leg swelling.  Gastrointestinal: Negative.   Musculoskeletal: Positive for arthralgias.  Neurological: Negative.     Social History   Tobacco Use  . Smoking status: Never Smoker  . Smokeless tobacco: Never Used  Substance Use Topics  . Alcohol use: No   Objective:   BP (!) 156/86 (BP Location: Left Arm, Patient Position: Sitting, Cuff Size: Normal)   Pulse 85   Temp 98 F (36.7 C) (Oral)   Resp 16   Wt 140 lb (63.5 kg)   SpO2 99%   BMI 24.03 kg/m  Vitals:   12/29/17 1103  BP: (!) 156/86  Pulse: 85  Resp: 16  Temp: 98 F (36.7 C)  TempSrc: Oral  SpO2: 99%  Weight: 140 lb (63.5 kg)     Physical Exam  Constitutional:  She appears well-developed and well-nourished. No distress.  Eyes: Conjunctivae are normal. No scleral icterus.  Cardiovascular: Normal rate, regular rhythm, normal heart sounds and intact distal pulses.  No murmur heard. Pulmonary/Chest: Effort normal and breath sounds normal. No respiratory distress. She has no wheezes. She has no rales.  Musculoskeletal: She exhibits no edema.  B/l Knee: Normal to inspection with no erythema  or effusion or obvious bony abnormalities.  Palpation normal with no warmth or joint line tenderness or patellar tenderness or condyle tenderness. ROM normal in flexion and extension and lower leg rotation. Ligaments with solid consistent endpoints including ACL, PCL, LCL, MCL. Negative Mcmurray's and provocative meniscal tests. Non painful patellar compression. Patellar and quadriceps tendons unremarkable. Hamstring and quadriceps strength is normal.  Neurological: She is alert.  Skin: Skin is warm and dry. No rash noted.  Psychiatric: She has a normal mood and affect. Her behavior is normal.  Vitals reviewed.      Assessment & Plan:      Problem List Items Addressed This Visit      Cardiovascular and Mediastinum   Hypertension - Primary    Remains uncontrolled Continue lisinopril 40mg  daily Start amlodipine 5 mg daily Discussed possible side effects Patient to call in 2 weeks with report of home BPs and can consider increasing dose prn F/u in 3 months      Relevant Medications   amLODipine (NORVASC) 5 MG tablet     Musculoskeletal and Integument   Osteoarthritis    Previously with IT band syndrome, but now with bilateral knee pain No abnormalities on exam Likely b/l OA Discussed exercise, bracing, ice, voltaren prn Return precautions discussed          Return in about 3 months (around 03/28/2018) for HTN f/u.   The entirety of the information documented in the History of Present Illness, Review of Systems and Physical Exam were personally obtained by me. Portions of this information were initially documented by Raquel Sarna Ratchford, CMA and reviewed by me for thoroughness and accuracy.    Virginia Crews, MD, MPH [facility] 12/29/2017 11:47 AM

## 2017-12-29 NOTE — Assessment & Plan Note (Signed)
Remains uncontrolled Continue lisinopril 40mg  daily Start amlodipine 5 mg daily Discussed possible side effects Patient to call in 2 weeks with report of home BPs and can consider increasing dose prn F/u in 3 months

## 2017-12-29 NOTE — Patient Instructions (Signed)

## 2017-12-29 NOTE — Assessment & Plan Note (Signed)
Previously with IT band syndrome, but now with bilateral knee pain No abnormalities on exam Likely b/l OA Discussed exercise, bracing, ice, voltaren prn Return precautions discussed

## 2018-01-14 ENCOUNTER — Telehealth: Payer: Self-pay | Admitting: Family Medicine

## 2018-01-14 DIAGNOSIS — I10 Essential (primary) hypertension: Secondary | ICD-10-CM

## 2018-01-14 MED ORDER — AMLODIPINE BESYLATE 10 MG PO TABS: 10.0000 mg | ORAL_TABLET | Freq: Every day | ORAL | 3 refills | Status: DC

## 2018-01-14 NOTE — Telephone Encounter (Signed)
Pt was seen on 12/29/2017 for HTN. Pt was advised to continue lisinopril 40 mg, and start Amlodipine 5 mg. Denies side effects such as swelling. Is asymptomatic. Current BP's are ranging from 130's-150's/60's-80's. Please review.

## 2018-01-14 NOTE — Telephone Encounter (Signed)
Pt advised and new medication sent to pharmacy.

## 2018-01-14 NOTE — Addendum Note (Signed)
Addended by: Brennan Bailey on: 01/14/2018 04:55 PM   Modules accepted: Orders

## 2018-01-14 NOTE — Telephone Encounter (Signed)
OK to increase amlodipine to 10mg  daily (can take 2 of the 5mg  pills together).  Can send new Rx for 10mg  pills #90 r3.  We will f/u as scheduled.  If she experiences swelling or symptoms of hypotension, please call back sooner and we will decrease.  Virginia Crews, MD, MPH [facility] 01/14/2018 2:40 PM

## 2018-01-14 NOTE — Telephone Encounter (Signed)
Patient wanting to talk to Dr. Jacinto Reap or Raquel Sarna regarding her taking the Amlodipine and discuss her BP readings with you.

## 2018-01-19 ENCOUNTER — Other Ambulatory Visit: Payer: Self-pay | Admitting: Family Medicine

## 2018-01-19 MED ORDER — LISINOPRIL 40 MG PO TABS
40.0000 mg | ORAL_TABLET | Freq: Every day | ORAL | 3 refills | Status: DC
Start: 2018-01-19 — End: 2018-08-31

## 2018-01-19 NOTE — Telephone Encounter (Signed)
Patient needs Lisinopril 40 mg. Refilled to [facility] on [address]

## 2018-02-18 NOTE — Telephone Encounter (Signed)
This encounter was created in error - please disregard.

## 2018-03-25 ENCOUNTER — Ambulatory Visit (INDEPENDENT_AMBULATORY_CARE_PROVIDER_SITE_OTHER): Payer: Medicare Other

## 2018-03-25 VITALS — BP 152/68 | HR 90 | Temp 98.6°F | Ht 64.0 in | Wt 138.0 lb

## 2018-03-25 DIAGNOSIS — Z0000 Encounter for general adult medical examination without abnormal findings: Secondary | ICD-10-CM | POA: Diagnosis not present

## 2018-03-25 NOTE — Patient Instructions (Signed)
Ms. Marxen , Thank you for taking time to come for your Medicare Wellness Visit. I appreciate your ongoing commitment to your health goals. Please review the following plan we discussed and let me know if I can assist you in the future.   Screening recommendations/referrals: Colonoscopy: N/A Mammogram: N/A Bone Density: Pt declines referral today.  Recommended yearly ophthalmology/optometry visit for glaucoma screening and checkup Recommended yearly dental visit for hygiene and checkup  Vaccinations: Influenza vaccine: N/A Pneumococcal vaccine: Pt declines today.  Tdap vaccine: Pt declines today.  Shingles vaccine: Pt declines today.     Advanced directives: Advance directive discussed with you today. Even though you declined this today please call our office should you change your mind and we can give you the proper paperwork for you to fill out.  Conditions/risks identified: Recommend to start drinking at least 2 to 4 glasses of water a day.   Next appointment: 03/27/18 @ 11:00 AM with Dr Brita Romp.   Preventive Care 70 Years and Older, Female Preventive care refers to lifestyle choices and visits with your health care provider that can promote health and wellness. What does preventive care include?  A yearly physical exam. This is also called an annual well check.  Dental exams once or twice a year.  Routine eye exams. Ask your health care provider how often you should have your eyes checked.  Personal lifestyle choices, including:  Daily care of your teeth and gums.  Regular physical activity.  Eating a healthy diet.  Avoiding tobacco and drug use.  Limiting alcohol use.  Practicing safe sex.  Taking low-dose aspirin every day.  Taking vitamin and mineral supplements as recommended by your health care provider. What happens during an annual well check? The services and screenings done by your health care provider during your annual well check will depend on your  age, overall health, lifestyle risk factors, and family history of disease. Counseling  Your health care provider may ask you questions about your:  Alcohol use.  Tobacco use.  Drug use.  Emotional well-being.  Home and relationship well-being.  Sexual activity.  Eating habits.  History of falls.  Memory and ability to understand (cognition).  Work and work Statistician.  Reproductive health. Screening  You may have the following tests or measurements:  Height, weight, and BMI.  Blood pressure.  Lipid and cholesterol levels. These may be checked every 5 years, or more frequently if you are over 70 years old.  Skin check.  Lung cancer screening. You may have this screening every year starting at age 75 if you have a 30-pack-year history of smoking and currently smoke or have quit within the past 15 years.  Fecal occult blood test (FOBT) of the stool. You may have this test every year starting at age 20.  Flexible sigmoidoscopy or colonoscopy. You may have a sigmoidoscopy every 5 years or a colonoscopy every 10 years starting at age 28.  Hepatitis C blood test.  Hepatitis B blood test.  Sexually transmitted disease (STD) testing.  Diabetes screening. This is done by checking your blood sugar (glucose) after you have not eaten for a while (fasting). You may have this done every 1-3 years.  Bone density scan. This is done to screen for osteoporosis. You may have this done starting at age 17.  Mammogram. This may be done every 1-2 years. Talk to your health care provider about how often you should have regular mammograms. Talk with your health care provider about your test  results, treatment options, and if necessary, the need for more tests. Vaccines  Your health care provider may recommend certain vaccines, such as:  Influenza vaccine. This is recommended every year.  Tetanus, diphtheria, and acellular pertussis (Tdap, Td) vaccine. You may need a Td booster  every 10 years.  Zoster vaccine. You may need this after age 27.  Pneumococcal 13-valent conjugate (PCV13) vaccine. One dose is recommended after age 65.  Pneumococcal polysaccharide (PPSV23) vaccine. One dose is recommended after age 30. Talk to your health care provider about which screenings and vaccines you need and how often you need them. This information is not intended to replace advice given to you by your health care provider. Make sure you discuss any questions you have with your health care provider. Document Released: 11/17/2015 Document Revised: 07/10/2016 Document Reviewed: 08/22/2015 Elsevier Interactive Patient Education  2017 [place] Prevention in the Home Falls can cause injuries. They can happen to people of all ages. There are many things you can do to make your home safe and to help prevent falls. What can I do on the outside of my home?  Regularly fix the edges of walkways and driveways and fix any cracks.  Remove anything that might make you trip as you walk through a door, such as a raised step or threshold.  Trim any bushes or trees on the path to your home.  Use bright outdoor lighting.  Clear any walking paths of anything that might make someone trip, such as rocks or tools.  Regularly check to see if handrails are loose or broken. Make sure that both sides of any steps have handrails.  Any raised decks and porches should have guardrails on the edges.  Have any leaves, snow, or ice cleared regularly.  Use sand or salt on walking paths during winter.  Clean up any spills in your garage right away. This includes oil or grease spills. What can I do in the bathroom?  Use night lights.  Install grab bars by the toilet and in the tub and shower. Do not use towel bars as grab bars.  Use non-skid mats or decals in the tub or shower.  If you need to sit down in the shower, use a plastic, non-slip stool.  Keep the floor dry. Clean up any  water that spills on the floor as soon as it happens.  Remove soap buildup in the tub or shower regularly.  Attach bath mats securely with double-sided non-slip rug tape.  Do not have throw rugs and other things on the floor that can make you trip. What can I do in the bedroom?  Use night lights.  Make sure that you have a light by your bed that is easy to reach.  Do not use any sheets or blankets that are too big for your bed. They should not hang down onto the floor.  Have a firm chair that has side arms. You can use this for support while you get dressed.  Do not have throw rugs and other things on the floor that can make you trip. What can I do in the kitchen?  Clean up any spills right away.  Avoid walking on wet floors.  Keep items that you use a lot in easy-to-reach places.  If you need to reach something above you, use a strong step stool that has a grab bar.  Keep electrical cords out of the way.  Do not use floor polish or wax that makes  floors slippery. If you must use wax, use non-skid floor wax.  Do not have throw rugs and other things on the floor that can make you trip. What can I do with my stairs?  Do not leave any items on the stairs.  Make sure that there are handrails on both sides of the stairs and use them. Fix handrails that are broken or loose. Make sure that handrails are as long as the stairways.  Check any carpeting to make sure that it is firmly attached to the stairs. Fix any carpet that is loose or worn.  Avoid having throw rugs at the top or bottom of the stairs. If you do have throw rugs, attach them to the floor with carpet tape.  Make sure that you have a light switch at the top of the stairs and the bottom of the stairs. If you do not have them, ask someone to add them for you. What else can I do to help prevent falls?  Wear shoes that:  Do not have high heels.  Have rubber bottoms.  Are comfortable and fit you well.  Are closed  at the toe. Do not wear sandals.  If you use a stepladder:  Make sure that it is fully opened. Do not climb a closed stepladder.  Make sure that both sides of the stepladder are locked into place.  Ask someone to hold it for you, if possible.  Clearly mark and make sure that you can see:  Any grab bars or handrails.  First and last steps.  Where the edge of each step is.  Use tools that help you move around (mobility aids) if they are needed. These include:  Canes.  Walkers.  Scooters.  Crutches.  Turn on the lights when you go into a dark area. Replace any light bulbs as soon as they burn out.  Set up your furniture so you have a clear path. Avoid moving your furniture around.  If any of your floors are uneven, fix them.  If there are any pets around you, be aware of where they are.  Review your medicines with your doctor. Some medicines can make you feel dizzy. This can increase your chance of falling. Ask your doctor what other things that you can do to help prevent falls. This information is not intended to replace advice given to you by your health care provider. Make sure you discuss any questions you have with your health care provider. Document Released: 08/17/2009 Document Revised: 03/28/2016 Document Reviewed: 11/25/2014 Elsevier Interactive Patient Education  2017 [employer].

## 2018-03-25 NOTE — Progress Notes (Signed)
Subjective:   Marissa Gilmore is a 84 y.o. female who presents for an Initial Medicare Annual Wellness Visit.  Review of Systems    N/A  Cardiac Risk Factors include: advanced age (>48men, >52 women);hypertension     Objective:    Today's Vitals   03/25/18 1040 03/25/18 1057  BP: (!) 174/64 (!) 152/68  Pulse: 90   Temp: 98.6 F (37 C)   TempSrc: Oral   Weight: 138 lb (62.6 kg)   Height: 5\' 4"  (1.626 m)   PainSc: 0-No pain    Body mass index is 23.69 kg/m.  Advanced Directives 03/25/2018 07/21/2017  Does Patient Have a Medical Advance Directive? No No  Would patient like information on creating a medical advance directive? No - Patient declined -    Current Medications (verified) Outpatient Encounter Medications as of 03/25/2018  Medication Sig  . amLODipine (NORVASC) 10 MG tablet Take 1 tablet (10 mg total) by mouth daily.  . diclofenac sodium (VOLTAREN) 1 % GEL Apply 2 g topically 4 (four) times daily as needed (pain).  Marland Kitchen lisinopril (PRINIVIL,ZESTRIL) 40 MG tablet Take 1 tablet (40 mg total) by mouth daily.  . naproxen sodium (ALEVE) 220 MG tablet Take 220 mg by mouth as needed.   No facility-administered encounter medications on file as of 03/25/2018.     Allergies (verified) Patient has no known allergies.   History: Past Medical History:  Diagnosis Date  . Anxiety   . Cataract   . Hypertension    Past Surgical History:  Procedure Laterality Date  . CATARACT EXTRACTION Right    Family History  Problem Relation Age of Onset  . Heart disease Mother        no MI  . Peripheral Artery Disease Mother 43  . Bladder Cancer Father 87   Social History   Socioeconomic History  . Marital status: Single    Spouse name: Not on file  . Number of children: 0  . Years of education: Not on file  . Highest education level: 12th grade  Occupational History  . Occupation: retired    Comment: Control and instrumentation engineer  Social Needs  . Financial resource strain: Not  hard at all  . Food insecurity:    Worry: Never true    Inability: Never true  . Transportation needs:    Medical: No    Non-medical: No  Tobacco Use  . Smoking status: Never Smoker  . Smokeless tobacco: Never Used  Substance and Sexual Activity  . Alcohol use: No  . Drug use: No  . Sexual activity: Not on file  Lifestyle  . Physical activity:    Days per week: Not on file    Minutes per session: Not on file  . Stress: Not at all  Relationships  . Social connections:    Talks on phone: Not on file    Gets together: Not on file    Attends religious service: Not on file    Active member of club or organization: Not on file    Attends meetings of clubs or organizations: Not on file    Relationship status: Not on file  Other Topics Concern  . Not on file  Social History Narrative  . Not on file    Tobacco Counseling Counseling given: Not Answered   Clinical Intake:  Pre-visit preparation completed: Yes  Pain : No/denies pain Pain Score: 0-No pain     Nutritional Status: BMI of 19-24  Normal Nutritional Risks: None Diabetes: No  How often do you need to have someone help you when you read instructions, pamphlets, or other written materials from your doctor or pharmacy?: 1 - Never  Interpreter Needed?: No  Information entered by :: [facility], LPN   Activities of Daily Living In your present state of health, do you have any difficulty performing the following activities: 03/25/2018 07/29/2017  Hearing? N N  Vision? N N  Difficulty concentrating or making decisions? N N  Walking or climbing stairs? N N  Dressing or bathing? N N  Doing errands, shopping? N Y  Conservation officer, nature and eating ? N -  Using the Toilet? N -  In the past six months, have you accidently leaked urine? N -  Do you have problems with loss of bowel control? N -  Managing your Medications? N -  Managing your Finances? N -  Housekeeping or managing your Housekeeping? N -  Some recent data  might be hidden     Immunizations and Health Maintenance  There is no immunization history on file for this patient. Health Maintenance Due  Topic Date Due  . TETANUS/TDAP  04/30/1952  . DEXA SCAN  04/30/1998  . PNA vac Low Risk Adult (1 of 2 - PCV13) 04/30/1998    Patient Care Team: Virginia Crews, MD as PCP - General (Family Medicine)  Indicate any recent Medical Services you may have received from other than [facility] providers in the past year (date may be approximate).     Assessment:   This is a routine wellness examination for Marissa Gilmore.  Hearing/Vision screen No exam data present  Dietary issues and exercise activities discussed: Current Exercise Habits: The patient does not participate in regular exercise at present, Exercise limited by: None identified  Goals    . DIET - INCREASE WATER INTAKE     Recommend to start drinking at least 2 to 4 glasses of water a day.       Depression Screen PHQ 2/9 Scores 03/25/2018 07/29/2017  PHQ - 2 Score 0 0    Fall Risk Fall Risk  03/25/2018 07/29/2017  Falls in the past year? No No    Is the patient's home free of loose throw rugs in walkways, pet beds, electrical cords, etc?   yes      Grab bars in the bathroom? yes      Handrails on the stairs? yes      Adequate lighting?   yes  Timed Get Up and Go Performed N/A  Cognitive Function: Pt declined screening today.         Screening Tests Health Maintenance  Topic Date Due  . TETANUS/TDAP  04/30/1952  . DEXA SCAN  04/30/1998  . PNA vac Low Risk Adult (1 of 2 - PCV13) 04/30/1998  . INFLUENZA VACCINE  06/04/2018    Qualifies for Shingles Vaccine? Due for Shingles vaccine. Declined my offer to administer today. Education has been provided regarding the importance of this vaccine. Pt has been advised to call her insurance company to determine her out of pocket expense. Advised she may also receive this vaccine at her local pharmacy or Health Dept. Verbalized acceptance  and understanding.  Cancer Screenings: Lung: Low Dose CT Chest recommended if Age 36-80 years, 30 pack-year currently smoking OR have quit w/in 15years. Patient does not qualify. Breast: Up to date on Mammogram? N/A Up to date of Bone Density/Dexa? No, pt declined referral today. Colorectal: N/A  Additional Screenings:  Hepatitis C Screening: N/A  Plan:  I have personally reviewed and addressed the Medicare Annual Wellness questionnaire and have noted the following in the patient's chart:  A. Medical and social history B. Use of alcohol, tobacco or illicit drugs  C. Current medications and supplements D. Functional ability and status E.  Nutritional status F.  Physical activity G. Advance directives H. List of other physicians I.  Hospitalizations, surgeries, and ER visits in previous 12 months J.  [place] such as hearing and vision if needed, cognitive and depression L. Referrals and appointments - none  In addition, I have reviewed and discussed with patient certain preventive protocols, quality metrics, and best practice recommendations. A written personalized care plan for preventive services as well as general preventive health recommendations were provided to patient.  See attached scanned questionnaire for additional information.   Signed,  Fabio Neighbors, LPN Nurse Health Advisor   Nurse Recommendations: Pt declined the pneumonia vaccine, tetanus vaccine and DEXA referral today.

## 2018-03-27 ENCOUNTER — Encounter: Payer: Self-pay | Admitting: Family Medicine

## 2018-03-27 ENCOUNTER — Ambulatory Visit: Payer: Medicare Other | Admitting: Family Medicine

## 2018-03-27 VITALS — BP 142/80 | HR 76 | Temp 98.1°F | Resp 16 | Wt 138.0 lb

## 2018-03-27 DIAGNOSIS — I10 Essential (primary) hypertension: Secondary | ICD-10-CM | POA: Diagnosis not present

## 2018-03-27 DIAGNOSIS — J302 Other seasonal allergic rhinitis: Secondary | ICD-10-CM | POA: Diagnosis not present

## 2018-03-27 NOTE — Assessment & Plan Note (Signed)
Home readings are well controlled  Reading here is slightly elevated, but goal given age is <150/90 Continue current meds F/u in 6 months Suggested BMP, but patient declined

## 2018-03-27 NOTE — Patient Instructions (Signed)

## 2018-03-27 NOTE — Progress Notes (Signed)
Patient: Marissa Gilmore Female    DOB: [birth year]   84 y.o.   MRN: [id number] Visit Date: 03/27/2018  Today's Provider: Lavon Paganini, MD   I, Martha Clan, CMA, am acting as scribe for Lavon Paganini, MD.  Chief Complaint  Patient presents with  . Hypertension   Subjective:    HPI      Hypertension, follow-up:  BP Readings from Last 3 Encounters:  03/27/18 (!) 142/80  03/25/18 (!) 152/68  12/29/17 (!) 156/86    She was last seen for hypertension 3 months ago.  BP at that visit was 156/86. Management since that visit includes continuing lisinopril 40 mg, and adding amlodipine 5 mg. Pt called office in 01/14/2018, and home BP readings were still elevated. Amlodipine was increased to 10 mg. She reports good compliance with treatment. She is not having side effects.  She is exercising. Does not take elevators, and uses stairs. Also parks far away to increase walking. She is adherent to low salt diet.   Outside blood pressures are 120's-130's/50's-80's. She is experiencing fatigue and lower extremity edema.  Patient denies chest pain, chest pressure/discomfort, claudication, dyspnea, exertional chest pressure/discomfort, irregular heart beat, near-syncope, orthopnea, palpitations and syncope.   Cardiovascular risk factors include advanced age (older than 23 for men, 29 for women) and hypertension.  Use of agents associated with hypertension: NSAIDS. States she occasionally uses Aleve.    Weight trend: stable Wt Readings from Last 3 Encounters:  03/27/18 138 lb (62.6 kg)  03/25/18 138 lb (62.6 kg)  12/29/17 140 lb (63.5 kg)    Current diet: in general, a "healthy" diet    ------------------------------------------------------------------------ Pt states she experiences sinus congestion at nighttime. Denies other seasonal allergy sx, but states she sometimes cough and sneezes. Has tried Flonase for this, without relief.  No Known  Allergies   Current Outpatient Medications:  .  amLODipine (NORVASC) 10 MG tablet, Take 1 tablet (10 mg total) by mouth daily., Disp: 90 tablet, Rfl: 3 .  lisinopril (PRINIVIL,ZESTRIL) 40 MG tablet, Take 1 tablet (40 mg total) by mouth daily., Disp: 90 tablet, Rfl: 3 .  diclofenac sodium (VOLTAREN) 1 % GEL, Apply 2 g topically 4 (four) times daily as needed (pain). (Patient not taking: Reported on 03/27/2018), Disp: 100 g, Rfl: 2 .  naproxen sodium (ALEVE) 220 MG tablet, Take 220 mg by mouth as needed., Disp: , Rfl:   Review of Systems  Constitutional: Positive for fatigue. Negative for activity change, appetite change, chills, diaphoresis, fever and unexpected weight change.  HENT: Positive for sinus pressure.   Respiratory: Positive for cough. Negative for shortness of breath.   Cardiovascular: Positive for leg swelling. Negative for chest pain and palpitations.    Social History   Tobacco Use  . Smoking status: Never Smoker  . Smokeless tobacco: Never Used  Substance Use Topics  . Alcohol use: No   Objective:   BP (!) 142/80 (BP Location: Left Arm, Patient Position: Sitting, Cuff Size: Normal)   Pulse 76   Temp 98.1 F (36.7 C) (Oral)   Resp 16   Wt 138 lb (62.6 kg)   SpO2 97%   BMI 23.69 kg/m  Vitals:   03/27/18 1102  BP: (!) 142/80  Pulse: 76  Resp: 16  Temp: 98.1 F (36.7 C)  TempSrc: Oral  SpO2: 97%  Weight: 138 lb (62.6 kg)     Physical Exam  Constitutional: She is oriented to person, place, and time. She appears  well-developed and well-nourished. No distress.  HENT:  Head: Normocephalic and atraumatic.  Eyes: Conjunctivae are normal. No scleral icterus.  Cardiovascular: Normal rate, regular rhythm, normal heart sounds and intact distal pulses.  No murmur heard. Pulmonary/Chest: Effort normal and breath sounds normal. No respiratory distress. She has no wheezes. She has no rales.  Musculoskeletal: She exhibits no edema or deformity.  Neurological: She is  alert and oriented to person, place, and time.  Skin: Skin is warm and dry. Capillary refill takes less than 2 seconds. No rash noted.  Psychiatric: She has a normal mood and affect. Her behavior is normal.  Vitals reviewed.      Assessment & Plan:   Problem List Items Addressed This Visit      Cardiovascular and Mediastinum   Hypertension - Primary    Home readings are well controlled  Reading here is slightly elevated, but goal given age is <150/90 Continue current meds F/u in 6 months Suggested BMP, but patient declined        Respiratory   Allergic rhinitis    Not controlled with Flonase Suffering from postnasal drip and nasal congestion and sneezing She does not think this is related to allergies, but it occurs when she goes outside during high pollen seasons Suggested trying nasal saline instead Avoid Sudafed and decongestants given hypertension and her age          Return in about 5 months (around 08/27/2018) for CPE.   The entirety of the information documented in the History of Present Illness, Review of Systems and Physical Exam were personally obtained by me. Portions of this information were initially documented by Raquel Sarna Ratchford, CMA and reviewed by me for thoroughness and accuracy.    Virginia Crews, MD, MPH [facility] 03/27/2018 11:57 AM

## 2018-03-27 NOTE — Assessment & Plan Note (Signed)
Not controlled with Flonase Suffering from postnasal drip and nasal congestion and sneezing She does not think this is related to allergies, but it occurs when she goes outside during high pollen seasons Suggested trying nasal saline instead Avoid Sudafed and decongestants given hypertension and her age

## 2018-08-31 ENCOUNTER — Encounter: Payer: Self-pay | Admitting: Family Medicine

## 2018-08-31 ENCOUNTER — Ambulatory Visit (INDEPENDENT_AMBULATORY_CARE_PROVIDER_SITE_OTHER): Payer: Medicare Other | Admitting: Family Medicine

## 2018-08-31 VITALS — BP 140/80 | HR 58 | Temp 98.3°F | Resp 16 | Ht 64.0 in | Wt 138.0 lb

## 2018-08-31 DIAGNOSIS — Z0000 Encounter for general adult medical examination without abnormal findings: Secondary | ICD-10-CM

## 2018-08-31 DIAGNOSIS — I10 Essential (primary) hypertension: Secondary | ICD-10-CM

## 2018-08-31 MED ORDER — AMLODIPINE BESYLATE 10 MG PO TABS: 10.0000 mg | ORAL_TABLET | Freq: Every day | ORAL | 3 refills | Status: DC

## 2018-08-31 MED ORDER — LISINOPRIL 40 MG PO TABS: 40.0000 mg | ORAL_TABLET | Freq: Every day | ORAL | 3 refills | Status: DC

## 2018-08-31 NOTE — Progress Notes (Signed)
Patient: Marissa Gilmore, Female    DOB: 1931-08-08, 84 y.o.   MRN: [id number] Visit Date: 08/31/2018  Today's Provider: Lavon Paganini, MD   Chief Complaint  Patient presents with  . Annual Exam   Subjective:   Patient had a AWE with [facility] on 03/25/2018   Complete Physical Marissa Gilmore is a 84 y.o. female. She feels well. She reports exercising none. She reports she is sleeping well.  Patient does not want any vaccines or DEXA scan.  She doesn't think that she would get any treatment for ostoeporosis if she had it.  She declines any lab work.  ----------------------------------------------------------- Hypertension: Stable. Patient was seen 6 months ago for this. BP readings at home well controlled.  She complains of ongoing nasal congestion at bedtime.  She has not tried an antihistamine or flonase because she didn't have any other allergy symptoms  She is also concerned about knee pain.  It is mild, bilateral, and aching.  She uses Voltaren or NSAIDs sparingly.  Patient Declined Influenza Vaccine.  Review of Systems  Constitutional: Negative.   HENT: Negative.   Eyes: Negative.   Respiratory: Negative.   Cardiovascular: Negative.   Gastrointestinal: Negative.   Endocrine: Negative.   Genitourinary: Negative.   Musculoskeletal: Positive for arthralgias.  Skin: Negative.   Allergic/Immunologic: Negative.   Neurological: Negative.   Hematological: Negative.   Psychiatric/Behavioral: Negative.     Social History   Socioeconomic History  . Marital status: Single    Spouse name: Not on file  . Number of children: 0  . Years of education: Not on file  . Highest education level: 12th grade  Occupational History  . Occupation: retired    Comment: Control and instrumentation engineer  Social Needs  . Financial resource strain: Not hard at all  . Food insecurity:    Worry: Never true    Inability: Never true  . Transportation needs:    Medical: No   Non-medical: No  Tobacco Use  . Smoking status: Never Smoker  . Smokeless tobacco: Never Used  Substance and Sexual Activity  . Alcohol use: No  . Drug use: No  . Sexual activity: Not on file  Lifestyle  . Physical activity:    Days per week: Not on file    Minutes per session: Not on file  . Stress: Not at all  Relationships  . Social connections:    Talks on phone: Not on file    Gets together: Not on file    Attends religious service: Not on file    Active member of club or organization: Not on file    Attends meetings of clubs or organizations: Not on file    Relationship status: Not on file  . Intimate partner violence:    Fear of current or ex partner: Not on file    Emotionally abused: Not on file    Physically abused: Not on file    Forced sexual activity: Not on file  Other Topics Concern  . Not on file  Social History Narrative  . Not on file    Past Medical History:  Diagnosis Date  . Anxiety   . Cataract   . Hypertension      Patient Active Problem List   Diagnosis Date Noted  . Osteoarthritis 11/14/2017  . Allergic rhinitis 10/03/2017  . Hypertension 08/22/2017  . Fatigue 08/22/2017    Past Surgical History:  Procedure Laterality Date  . CATARACT EXTRACTION Right  Her family history includes Bladder Cancer (age of onset: 9) in her father; Heart disease in her mother; Peripheral Artery Disease (age of onset: 27) in her mother.      Current Outpatient Medications:  .  amLODipine (NORVASC) 10 MG tablet, Take 1 tablet (10 mg total) by mouth daily., Disp: 90 tablet, Rfl: 3 .  lisinopril (PRINIVIL,ZESTRIL) 40 MG tablet, Take 1 tablet (40 mg total) by mouth daily., Disp: 90 tablet, Rfl: 3 .  diclofenac sodium (VOLTAREN) 1 % GEL, Apply 2 g topically 4 (four) times daily as needed (pain). (Patient not taking: Reported on 03/27/2018), Disp: 100 g, Rfl: 2 .  naproxen sodium (ALEVE) 220 MG tablet, Take 220 mg by mouth as needed., Disp: , Rfl:   Patient  Care Team: Virginia Crews, MD as PCP - General (Family Medicine)     Objective:   Vitals: BP 140/80 (BP Location: Left Arm, Patient Position: Sitting, Cuff Size: Normal)   Pulse (!) 58   Temp 98.3 F (36.8 C) (Oral)   Resp 16   Ht 5\' 4"  (1.626 m)   Wt 138 lb (62.6 kg)   BMI 23.69 kg/m   Physical Exam  Constitutional: She is oriented to person, place, and time. She appears well-developed and well-nourished. No distress.  HENT:  Head: Normocephalic and atraumatic.  Right Ear: External ear normal.  Left Ear: External ear normal.  Nose: Nose normal.  Mouth/Throat: Oropharynx is clear and moist.  Eyes: Pupils are equal, round, and reactive to light. Conjunctivae and EOM are normal. No scleral icterus.  Neck: Neck supple. No thyromegaly present.  Cardiovascular: Normal rate, regular rhythm, normal heart sounds and intact distal pulses.  No murmur heard. Pulmonary/Chest: Effort normal and breath sounds normal. No respiratory distress. She has no wheezes. She has no rales.  Abdominal: Soft. Bowel sounds are normal. She exhibits no distension. There is no tenderness. There is no rebound and no guarding.  Musculoskeletal: She exhibits no edema or deformity.  B/l knees: ROM intact, strength intact. No TTP along joint line. Gait intact  Lymphadenopathy:    She has no cervical adenopathy.  Neurological: She is alert and oriented to person, place, and time.  Skin: Skin is warm and dry. Capillary refill takes less than 2 seconds. No rash noted.  Psychiatric: She has a normal mood and affect. Her behavior is normal.  Vitals reviewed.   Activities of Daily Living In your present state of health, do you have any difficulty performing the following activities: 03/25/2018  Hearing? N  Vision? N  Difficulty concentrating or making decisions? N  Walking or climbing stairs? N  Dressing or bathing? N  Doing errands, shopping? N  Preparing Food and eating ? N  Using the Toilet? N  In  the past six months, have you accidently leaked urine? N  Do you have problems with loss of bowel control? N  Managing your Medications? N  Managing your Finances? N  Housekeeping or managing your Housekeeping? N  Some recent data might be hidden    Fall Risk Assessment Fall Risk  03/25/2018 07/29/2017  Falls in the past year? No No     Depression Screen PHQ 2/9 Scores 03/25/2018 07/29/2017  PHQ - 2 Score 0 0     Assessment & Plan:    Annual Physical Reviewed patient's Family Medical History Reviewed and updated list of patient's medical providers Assessment of cognitive impairment was done Assessed patient's functional ability Established a written schedule for health screening services  Health Risk Assessent Completed and Reviewed  Exercise Activities and Dietary recommendations Goals    . DIET - INCREASE WATER INTAKE     Recommend to start drinking at least 2 to 4 glasses of water a day.         There is no immunization history on file for this patient.  Health Maintenance  Topic Date Due  . TETANUS/TDAP  04/30/1952  . DEXA SCAN  04/30/1998  . PNA vac Low Risk Adult (1 of 2 - PCV13) 04/30/1998  . INFLUENZA VACCINE  06/04/2018     Discussed health benefits of physical activity, and encouraged her to engage in regular exercise appropriate for her age and condition.    ------------------------------------------------------------------------------------------------------------  Problem List Items Addressed This Visit      Cardiovascular and Mediastinum   Hypertension    Home readings remain very well controlled Reading here is slightly elevated, but goal given age is less than 150/90 Continue current meds Follow-up in 6 months Suggested rechecking labs including screening lipid panel and CMP, patient declined      Relevant Medications   amLODipine (NORVASC) 10 MG tablet   lisinopril (PRINIVIL,ZESTRIL) 40 MG tablet    Other Visit Diagnoses    Encounter  for annual physical exam    -  Primary       Return in about 6 months (around 03/02/2019) for HTN f/u.   The entirety of the information documented in the History of Present Illness, Review of Systems and Physical Exam were personally obtained by me. Portions of this information were initially documented by Marissa Gilmore and Marissa Gilmore, [place] and reviewed by me for thoroughness and accuracy.    Virginia Crews, MD, MPH [facility] 08/31/2018 11:30 AM

## 2018-08-31 NOTE — Patient Instructions (Signed)
Preventive Care 65 Years and Older, Female Preventive care refers to lifestyle choices and visits with your health care provider that can promote health and wellness. What does preventive care include?  A yearly physical exam. This is also called an annual well check.  Dental exams once or twice a year.  Routine eye exams. Ask your health care provider how often you should have your eyes checked.  Personal lifestyle choices, including: ? Daily care of your teeth and gums. ? Regular physical activity. ? Eating a healthy diet. ? Avoiding tobacco and drug use. ? Limiting alcohol use. ? Practicing safe sex. ? Taking low-dose aspirin every day. ? Taking vitamin and mineral supplements as recommended by your health care provider. What happens during an annual well check? The services and screenings done by your health care provider during your annual well check will depend on your age, overall health, lifestyle risk factors, and family history of disease. Counseling Your health care provider may ask you questions about your:  Alcohol use.  Tobacco use.  Drug use.  Emotional well-being.  Home and relationship well-being.  Sexual activity.  Eating habits.  History of falls.  Memory and ability to understand (cognition).  Work and work environment.  Reproductive health.  Screening You may have the following tests or measurements:  Height, weight, and BMI.  Blood pressure.  Lipid and cholesterol levels. These may be checked every 5 years, or more frequently if you are over 50 years old.  Skin check.  Lung cancer screening. You may have this screening every year starting at age 55 if you have a 30-pack-year history of smoking and currently smoke or have quit within the past 15 years.  Fecal occult blood test (FOBT) of the stool. You may have this test every year starting at age 50.  Flexible sigmoidoscopy or colonoscopy. You may have a sigmoidoscopy every 5 years or  a colonoscopy every 10 years starting at age 50.  Hepatitis C blood test.  Hepatitis B blood test.  Sexually transmitted disease (STD) testing.  Diabetes screening. This is done by checking your blood sugar (glucose) after you have not eaten for a while (fasting). You may have this done every 1-3 years.  Bone density scan. This is done to screen for osteoporosis. You may have this done starting at age 65.  Mammogram. This may be done every 1-2 years. Talk to your health care provider about how often you should have regular mammograms.  Talk with your health care provider about your test results, treatment options, and if necessary, the need for more tests. Vaccines Your health care provider may recommend certain vaccines, such as:  Influenza vaccine. This is recommended every year.  Tetanus, diphtheria, and acellular pertussis (Tdap, Td) vaccine. You may need a Td booster every 10 years.  Varicella vaccine. You may need this if you have not been vaccinated.  Zoster vaccine. You may need this after age 60.  Measles, mumps, and rubella (MMR) vaccine. You may need at least one dose of MMR if you were born in 1957 or later. You may also need a second dose.  Pneumococcal 13-valent conjugate (PCV13) vaccine. One dose is recommended after age 65.  Pneumococcal polysaccharide (PPSV23) vaccine. One dose is recommended after age 65.  Meningococcal vaccine. You may need this if you have certain conditions.  Hepatitis A vaccine. You may need this if you have certain conditions or if you travel or work in places where you may be exposed to hepatitis   A.  Hepatitis B vaccine. You may need this if you have certain conditions or if you travel or work in places where you may be exposed to hepatitis B.  Haemophilus influenzae type b (Hib) vaccine. You may need this if you have certain conditions.  Talk to your health care provider about which screenings and vaccines you need and how often you  need them. This information is not intended to replace advice given to you by your health care provider. Make sure you discuss any questions you have with your health care provider. Document Released: 11/17/2015 Document Revised: 07/10/2016 Document Reviewed: 08/22/2015 Elsevier Interactive Patient Education  2018 Elsevier Inc[employer]  

## 2018-08-31 NOTE — Assessment & Plan Note (Signed)
Home readings remain very well controlled Reading here is slightly elevated, but goal given age is less than 150/90 Continue current meds Follow-up in 6 months Suggested rechecking labs including screening lipid panel and CMP, patient declined

## 2019-03-22 ENCOUNTER — Ambulatory Visit: Payer: Medicare Other | Admitting: Family Medicine

## 2019-03-22 ENCOUNTER — Other Ambulatory Visit: Payer: Self-pay

## 2019-03-22 ENCOUNTER — Encounter: Payer: Self-pay | Admitting: Family Medicine

## 2019-03-22 VITALS — BP 132/60 | HR 90 | Temp 98.4°F | Wt 147.0 lb

## 2019-03-22 DIAGNOSIS — R000 Tachycardia, unspecified: Secondary | ICD-10-CM | POA: Diagnosis not present

## 2019-03-22 DIAGNOSIS — R609 Edema, unspecified: Secondary | ICD-10-CM

## 2019-03-22 MED ORDER — FUROSEMIDE 20 MG PO TABS: 20.0000 mg | ORAL_TABLET | Freq: Every day | ORAL | 3 refills | Status: DC

## 2019-03-22 NOTE — Progress Notes (Signed)
Marissa Gilmore  MRN: [id number] DOB: 07/31/1931  Subjective:  HPI   The patient is an 85 year old female who presents for evaluation of edema.  She states that beginning in mid March when her activity decreased due to the shelter in place order she has noticed her feet swelling.  She states that it goes up to her calf.   She has been having SOB with exertion for about 4-5 days.  She states she has a cough but she has had it for months and has it every year.  She states that right now it is just a tickle that she gets in her throat and makes her feel like she needs to clear her throat.    Patient Active Problem List   Diagnosis Date Noted  . Osteoarthritis 11/14/2017  . Allergic rhinitis 10/03/2017  . Hypertension 08/22/2017  . Fatigue 08/22/2017   Past Medical History:  Diagnosis Date  . Anxiety   . Cataract   . Hypertension    Past Surgical History:  Procedure Laterality Date  . CATARACT EXTRACTION Right    Family History  Problem Relation Age of Onset  . Heart disease Mother        no MI  . Peripheral Artery Disease Mother 66  . Bladder Cancer Father 8   Social History   Socioeconomic History  . Marital status: Single    Spouse name: Not on file  . Number of children: 0  . Years of education: Not on file  . Highest education level: 12th grade  Occupational History  . Occupation: retired    Comment: Control and instrumentation engineer  Social Needs  . Financial resource strain: Not hard at all  . Food insecurity:    Worry: Never true    Inability: Never true  . Transportation needs:    Medical: No    Non-medical: No  Tobacco Use  . Smoking status: Never Smoker  . Smokeless tobacco: Never Used  Substance and Sexual Activity  . Alcohol use: No  . Drug use: No  . Sexual activity: Not on file  Lifestyle  . Physical activity:    Days per week: Not on file    Minutes per session: Not on file  . Stress: Not at all  Relationships  . Social connections:    Talks on  phone: Not on file    Gets together: Not on file    Attends religious service: Not on file    Active member of club or organization: Not on file    Attends meetings of clubs or organizations: Not on file    Relationship status: Not on file  . Intimate partner violence:    Fear of current or ex partner: Not on file    Emotionally abused: Not on file    Physically abused: Not on file    Forced sexual activity: Not on file  Other Topics Concern  . Not on file  Social History Narrative  . Not on file   Outpatient Encounter Medications as of 03/22/2019  Medication Sig  . amLODipine (NORVASC) 10 MG tablet Take 1 tablet (10 mg total) by mouth daily.  Marland Kitchen lisinopril (PRINIVIL,ZESTRIL) 40 MG tablet Take 1 tablet (40 mg total) by mouth daily.  . diclofenac sodium (VOLTAREN) 1 % GEL Apply 2 g topically 4 (four) times daily as needed (pain). (Patient not taking: Reported on 03/27/2018)  . naproxen sodium (ALEVE) 220 MG tablet Take 220 mg by mouth as needed.   No  facility-administered encounter medications on file as of 03/22/2019.    No Known Allergies  Review of Systems  Constitutional: Positive for malaise/fatigue. Negative for fever.  HENT: Negative for congestion and sore throat.   Respiratory: Positive for cough and shortness of breath. Negative for sputum production and wheezing.   Cardiovascular: Positive for claudication and leg swelling. Negative for chest pain, palpitations and orthopnea.  Musculoskeletal: Negative for falls.    Objective:  BP 132/60 (BP Location: Right Arm, Patient Position: Sitting, Cuff Size: Normal)   Pulse 90   Temp 98.4 F (36.9 C) (Oral)   Wt 147 lb (66.7 kg)   SpO2 96%   BMI 25.23 kg/m  Wt Readings from Last 3 Encounters:  03/22/19 147 lb (66.7 kg)  08/31/18 138 lb (62.6 kg)  03/27/18 138 lb (62.6 kg)   Physical Exam  Constitutional: She is oriented to person, place, and time and well-developed, well-nourished, and in no distress.  HENT:  Head:  Normocephalic.  Eyes: Conjunctivae are normal.  Neck: Neck supple.  Cardiovascular: Regular rhythm.  Tachycardic to auscultation.  Pulmonary/Chest: Effort normal. She has no wheezes.  Abdominal: Soft. Bowel sounds are normal.  Musculoskeletal:        General: Edema present.     Comments: 3+ pitting edema in lower legs up to knees bilaterally.  Neurological: She is alert and oriented to person, place, and time.  Skin: No rash noted.  Psychiatric: Mood, affect and judgment normal.    Assessment and Plan :  1. Peripheral edema Onset over the past month. Some SOB with exertion and sleeps better sitting up. Admits to eating many frozen meals with high salt content over the past 2 months. Will give low dose Lasix and check CXR with BNP, CMP and CBC. Denies chest pains and normal pulse oximetry today. Chart indicates 9 lb weight gain since October 2019. Recheck pending lab reports.  - furosemide (LASIX) 20 MG tablet; Take 1 tablet (20 mg total) by mouth daily.  Dispense: 30 tablet; Refill: 3 - CBC with Differential/Platelet - Comprehensive metabolic panel - B Nat Peptide - DG Chest 2 View  2. Tachycardia During auscultation heart rate up to 110-120. Suspect CHF. Will get CXR and labs to rule out infection versus CHF.

## 2019-03-23 ENCOUNTER — Ambulatory Visit
Admission: RE | Admit: 2019-03-23 | Discharge: 2019-03-23 | Disposition: A | Discharge status: Home / Self Care | Payer: Medicare Other | Source: Ambulatory Visit | Attending: Family Medicine | Admitting: Family Medicine

## 2019-03-23 ENCOUNTER — Other Ambulatory Visit: Payer: Self-pay

## 2019-03-23 DIAGNOSIS — R609 Edema, unspecified: Secondary | ICD-10-CM | POA: Diagnosis present

## 2019-03-24 LAB — CBC WITH DIFFERENTIAL/PLATELET
Basophils Absolute: 0 10*3/uL (ref 0.0–0.2)
Basos: 1 %
EOS (ABSOLUTE): 0.1 10*3/uL (ref 0.0–0.4)
Eos: 2 %
Hematocrit: 38.3 % (ref 34.0–46.6)
Hemoglobin: 13.3 g/dL (ref 11.1–15.9)
Immature Grans (Abs): 0 10*3/uL (ref 0.0–0.1)
Immature Granulocytes: 0 %
Lymphocytes Absolute: 0.8 10*3/uL (ref 0.7–3.1)
Lymphs: 13 %
MCH: 29.9 pg (ref 26.6–33.0)
MCHC: 34.7 g/dL (ref 31.5–35.7)
MCV: 86 fL (ref 79–97)
Monocytes Absolute: 0.7 10*3/uL (ref 0.1–0.9)
Monocytes: 11 %
Neutrophils Absolute: 4.6 10*3/uL (ref 1.4–7.0)
Neutrophils: 73 %
Platelets: 209 10*3/uL (ref 150–450)
RBC: 4.45 x10E6/uL (ref 3.77–5.28)
RDW: 12.8 % (ref 11.7–15.4)
WBC: 6.2 10*3/uL (ref 3.4–10.8)

## 2019-03-24 LAB — COMPREHENSIVE METABOLIC PANEL
ALT: 35 IU/L — ABNORMAL HIGH (ref 0–32)
AST: 22 IU/L (ref 0–40)
Albumin/Globulin Ratio: 2.2 (ref 1.2–2.2)
Albumin: 4.3 g/dL (ref 3.6–4.6)
Alkaline Phosphatase: 102 IU/L (ref 39–117)
BUN/Creatinine Ratio: 20 (ref 12–28)
BUN: 20 mg/dL (ref 8–27)
Bilirubin Total: 0.9 mg/dL (ref 0.0–1.2)
CO2: 17 mmol/L — ABNORMAL LOW (ref 20–29)
Calcium: 10.3 mg/dL (ref 8.7–10.3)
Chloride: 108 mmol/L — ABNORMAL HIGH (ref 96–106)
Creatinine, Ser: 1.02 mg/dL — ABNORMAL HIGH (ref 0.57–1.00)
GFR calc Af Amer: 58 mL/min/{1.73_m2} — ABNORMAL LOW (ref >59)
GFR calc non Af Amer: 50 mL/min/{1.73_m2} — ABNORMAL LOW (ref >59)
Globulin, Total: 2 g/dL (ref 1.5–4.5)
Glucose: 119 mg/dL — ABNORMAL HIGH (ref 65–99)
Potassium: 4.2 mmol/L (ref 3.5–5.2)
Sodium: 141 mmol/L (ref 134–144)
Total Protein: 6.3 g/dL (ref 6.0–8.5)

## 2019-03-24 LAB — BRAIN NATRIURETIC PEPTIDE: BNP: 484.9 pg/mL — ABNORMAL HIGH (ref 0.0–100.0)

## 2019-03-25 ENCOUNTER — Other Ambulatory Visit: Payer: Self-pay | Admitting: Family Medicine

## 2019-03-25 DIAGNOSIS — R000 Tachycardia, unspecified: Secondary | ICD-10-CM

## 2019-03-25 DIAGNOSIS — R609 Edema, unspecified: Secondary | ICD-10-CM

## 2019-03-26 ENCOUNTER — Telehealth: Payer: Self-pay

## 2019-03-26 NOTE — Telephone Encounter (Signed)
Patient was advised and states she is feeling a little better today and would go to ER if symptoms worsens.

## 2019-03-26 NOTE — Telephone Encounter (Signed)
-----   Message from Margo Common, [place] sent at 03/25/2019  2:33 PM EDT ----- BNP high with enlarge heart indicates congestive heart failure. Lasix should be helping with edema. Needs referral to a cardiologist for new onset congestive heart failure for further evaluation. May need to go to ER if symptoms of swelling and shortness of breath with fast heart beat worsening.

## 2019-03-31 ENCOUNTER — Ambulatory Visit: Payer: Self-pay | Admitting: Family Medicine

## 2019-03-31 ENCOUNTER — Other Ambulatory Visit: Payer: Self-pay

## 2019-03-31 ENCOUNTER — Ambulatory Visit (INDEPENDENT_AMBULATORY_CARE_PROVIDER_SITE_OTHER): Payer: Medicare Other

## 2019-03-31 DIAGNOSIS — Z0000 Encounter for general adult medical examination without abnormal findings: Secondary | ICD-10-CM

## 2019-03-31 NOTE — Patient Instructions (Addendum)
Marissa Gilmore , Thank you for taking time to come for your Medicare Wellness Visit. I appreciate your ongoing commitment to your health goals. Please review the following plan we discussed and let me know if I can assist you in the future.   Screening recommendations/referrals: Colonoscopy: No longer required.  Mammogram: No longer required.  Bone Density: Pt declined future order.  Recommended yearly ophthalmology/optometry visit for glaucoma screening and checkup Recommended yearly dental visit for hygiene and checkup  Vaccinations: Influenza vaccine: Pt declines today.  Pneumococcal vaccine: Pt declines today.  Tdap vaccine: Pt declines today.  Shingles vaccine: Pt declines today.     Advanced directives: Advance directive discussed with you today. Even though you declined this today please call our office should you change your mind and we can give you the proper paperwork for you to fill out.  Conditions/risks identified: Continue to increase water intake to 6-8 8 oz glasses of water daily.   Next appointment: Pt declined scheduling a CPE or AWV for this year and next.    Preventive Care 57 Years and Older, Female Preventive care refers to lifestyle choices and visits with your health care provider that can promote health and wellness. What does preventive care include?  A yearly physical exam. This is also called an annual well check.  Dental exams once or twice a year.  Routine eye exams. Ask your health care provider how often you should have your eyes checked.  Personal lifestyle choices, including:  Daily care of your teeth and gums.  Regular physical activity.  Eating a healthy diet.  Avoiding tobacco and drug use.  Limiting alcohol use.  Practicing safe sex.  Taking low-dose aspirin every day.  Taking vitamin and mineral supplements as recommended by your health care provider. What happens during an annual well check? The services and screenings done by your  health care provider during your annual well check will depend on your age, overall health, lifestyle risk factors, and family history of disease. Counseling  Your health care provider may ask you questions about your:  Alcohol use.  Tobacco use.  Drug use.  Emotional well-being.  Home and relationship well-being.  Sexual activity.  Eating habits.  History of falls.  Memory and ability to understand (cognition).  Work and work Statistician.  Reproductive health. Screening  You may have the following tests or measurements:  Height, weight, and BMI.  Blood pressure.  Lipid and cholesterol levels. These may be checked every 5 years, or more frequently if you are over 32 years old.  Skin check.  Lung cancer screening. You may have this screening every year starting at age 66 if you have a 30-pack-year history of smoking and currently smoke or have quit within the past 15 years.  Fecal occult blood test (FOBT) of the stool. You may have this test every year starting at age 12.  Flexible sigmoidoscopy or colonoscopy. You may have a sigmoidoscopy every 5 years or a colonoscopy every 10 years starting at age 27.  Hepatitis C blood test.  Hepatitis B blood test.  Sexually transmitted disease (STD) testing.  Diabetes screening. This is done by checking your blood sugar (glucose) after you have not eaten for a while (fasting). You may have this done every 1-3 years.  Bone density scan. This is done to screen for osteoporosis. You may have this done starting at age 15.  Mammogram. This may be done every 1-2 years. Talk to your health care provider about how often  you should have regular mammograms. Talk with your health care provider about your test results, treatment options, and if necessary, the need for more tests. Vaccines  Your health care provider may recommend certain vaccines, such as:  Influenza vaccine. This is recommended every year.  Tetanus, diphtheria, and  acellular pertussis (Tdap, Td) vaccine. You may need a Td booster every 10 years.  Zoster vaccine. You may need this after age 81.  Pneumococcal 13-valent conjugate (PCV13) vaccine. One dose is recommended after age 37.  Pneumococcal polysaccharide (PPSV23) vaccine. One dose is recommended after age 49. Talk to your health care provider about which screenings and vaccines you need and how often you need them. This information is not intended to replace advice given to you by your health care provider. Make sure you discuss any questions you have with your health care provider. Document Released: 11/17/2015 Document Revised: 07/10/2016 Document Reviewed: 08/22/2015 Elsevier Interactive Patient Education  2017 [employer] Prevention in the Home Falls can cause injuries. They can happen to people of all ages. There are many things you can do to make your home safe and to help prevent falls. What can I do on the outside of my home?  Regularly fix the edges of walkways and driveways and fix any cracks.  Remove anything that might make you trip as you walk through a door, such as a raised step or threshold.  Trim any bushes or trees on the path to your home.  Use bright outdoor lighting.  Clear any walking paths of anything that might make someone trip, such as rocks or tools.  Regularly check to see if handrails are loose or broken. Make sure that both sides of any steps have handrails.  Any raised decks and porches should have guardrails on the edges.  Have any leaves, snow, or ice cleared regularly.  Use sand or salt on walking paths during winter.  Clean up any spills in your garage right away. This includes oil or grease spills. What can I do in the bathroom?  Use night lights.  Install grab bars by the toilet and in the tub and shower. Do not use towel bars as grab bars.  Use non-skid mats or decals in the tub or shower.  If you need to sit down in the shower, use  a plastic, non-slip stool.  Keep the floor dry. Clean up any water that spills on the floor as soon as it happens.  Remove soap buildup in the tub or shower regularly.  Attach bath mats securely with double-sided non-slip rug tape.  Do not have throw rugs and other things on the floor that can make you trip. What can I do in the bedroom?  Use night lights.  Make sure that you have a light by your bed that is easy to reach.  Do not use any sheets or blankets that are too big for your bed. They should not hang down onto the floor.  Have a firm chair that has side arms. You can use this for support while you get dressed.  Do not have throw rugs and other things on the floor that can make you trip. What can I do in the kitchen?  Clean up any spills right away.  Avoid walking on wet floors.  Keep items that you use a lot in easy-to-reach places.  If you need to reach something above you, use a strong step stool that has a grab bar.  Keep electrical cords  out of the way.  Do not use floor polish or wax that makes floors slippery. If you must use wax, use non-skid floor wax.  Do not have throw rugs and other things on the floor that can make you trip. What can I do with my stairs?  Do not leave any items on the stairs.  Make sure that there are handrails on both sides of the stairs and use them. Fix handrails that are broken or loose. Make sure that handrails are as long as the stairways.  Check any carpeting to make sure that it is firmly attached to the stairs. Fix any carpet that is loose or worn.  Avoid having throw rugs at the top or bottom of the stairs. If you do have throw rugs, attach them to the floor with carpet tape.  Make sure that you have a light switch at the top of the stairs and the bottom of the stairs. If you do not have them, ask someone to add them for you. What else can I do to help prevent falls?  Wear shoes that:  Do not have high heels.  Have  rubber bottoms.  Are comfortable and fit you well.  Are closed at the toe. Do not wear sandals.  If you use a stepladder:  Make sure that it is fully opened. Do not climb a closed stepladder.  Make sure that both sides of the stepladder are locked into place.  Ask someone to hold it for you, if possible.  Clearly mark and make sure that you can see:  Any grab bars or handrails.  First and last steps.  Where the edge of each step is.  Use tools that help you move around (mobility aids) if they are needed. These include:  Canes.  Walkers.  Scooters.  Crutches.  Turn on the lights when you go into a dark area. Replace any light bulbs as soon as they burn out.  Set up your furniture so you have a clear path. Avoid moving your furniture around.  If any of your floors are uneven, fix them.  If there are any pets around you, be aware of where they are.  Review your medicines with your doctor. Some medicines can make you feel dizzy. This can increase your chance of falling. Ask your doctor what other things that you can do to help prevent falls. This information is not intended to replace advice given to you by your health care provider. Make sure you discuss any questions you have with your health care provider. Document Released: 08/17/2009 Document Revised: 03/28/2016 Document Reviewed: 11/25/2014 Elsevier Interactive Patient Education  2017 [employer].

## 2019-03-31 NOTE — Progress Notes (Addendum)
Subjective:   Marissa Gilmore is a 85 y.o. female who presents for Medicare Annual (Subsequent) preventive examination.    This visit is being conducted through telemedicine due to the COVID-19 pandemic. This patient has given me verbal consent via doximity to conduct this visit, patient states they are participating from their home address. Some vital signs may be absent or patient reported.    Patient identification: identified by name, DOB, and current address  Review of Systems:  N/A  Cardiac Risk Factors include: advanced age (>22men, >31 women);hypertension     Objective:     Vitals: There were no vitals taken for this visit.  There is no height or weight on file to calculate BMI. Unable to obtain vitals due to visit being conducted via telephonically.   Advanced Directives 03/31/2019 03/25/2018 07/21/2017  Does Patient Have a Medical Advance Directive? No No No  Would patient like information on creating a medical advance directive? No - Patient declined No - Patient declined -    Tobacco Social History   Tobacco Use  Smoking Status Never Smoker  Smokeless Tobacco Never Used     Counseling given: Not Answered   Clinical Intake:  Pre-visit preparation completed: Yes  Pain : No/denies pain     Nutritional Status: BMI 25 -29 Overweight Nutritional Risks: None Diabetes: No  How often do you need to have someone help you when you read instructions, pamphlets, or other written materials from your doctor or pharmacy?: 1 - Never  Interpreter Needed?: No  Information entered by :: Marissa [facility], LPN  Past Medical History:  Diagnosis Date  . Anxiety   . Cataract   . Hypertension    Past Surgical History:  Procedure Laterality Date  . CATARACT EXTRACTION Right    Family History  Problem Relation Age of Onset  . Heart disease Mother        no MI  . Peripheral Artery Disease Mother 28  . Bladder Cancer Father 56   Social History   Socioeconomic  History  . Marital status: Single    Spouse name: Not on file  . Number of children: 0  . Years of education: Not on file  . Highest education level: 12th grade  Occupational History  . Occupation: retired    Comment: Control and instrumentation engineer  Social Needs  . Financial resource strain: Not hard at all  . Food insecurity:    Worry: Never true    Inability: Never true  . Transportation needs:    Medical: No    Non-medical: No  Tobacco Use  . Smoking status: Never Smoker  . Smokeless tobacco: Never Used  Substance and Sexual Activity  . Alcohol use: No  . Drug use: No  . Sexual activity: Not on file  Lifestyle  . Physical activity:    Days per week: 0 days    Minutes per session: 0 min  . Stress: Only a little  Relationships  . Social connections:    Talks on phone: Patient refused    Gets together: Patient refused    Attends religious service: Patient refused    Active member of club or organization: Patient refused    Attends meetings of clubs or organizations: Patient refused    Relationship status: Patient refused  Other Topics Concern  . Not on file  Social History Narrative  . Not on file    Outpatient Encounter Medications as of 03/31/2019  Medication Sig  . amLODipine (NORVASC) 10 MG tablet Take 1  tablet (10 mg total) by mouth daily.  . furosemide (LASIX) 20 MG tablet Take 1 tablet (20 mg total) by mouth daily.  Marland Kitchen lisinopril (PRINIVIL,ZESTRIL) 40 MG tablet Take 1 tablet (40 mg total) by mouth daily.  . naproxen sodium (ALEVE) 220 MG tablet Take 220 mg by mouth as needed.  . diclofenac sodium (VOLTAREN) 1 % GEL Apply 2 g topically 4 (four) times daily as needed (pain). (Patient not taking: Reported on 03/27/2018)   No facility-administered encounter medications on file as of 03/31/2019.     Activities of Daily Living In your present state of health, do you have any difficulty performing the following activities: 03/31/2019  Hearing? N  Vision? N  Difficulty  concentrating or making decisions? N  Walking or climbing stairs? N  Comment Due to recent leg swelling.   Dressing or bathing? N  Doing errands, shopping? N  Preparing Food and eating ? N  Using the Toilet? N  In the past six months, have you accidently leaked urine? N  Do you have problems with loss of bowel control? N  Managing your Medications? N  Managing your Finances? N  Housekeeping or managing your Housekeeping? N  Some recent data might be hidden    Patient Care Team: Bacigalupo, Dionne Bucy, MD as PCP - General (Family Medicine) Marissa Kida, MD as Consulting Physician (Cardiology)    Assessment:   This is a routine wellness examination for Marissa Gilmore.  Exercise Activities and Dietary recommendations Current Exercise Habits: The patient does not participate in regular exercise at present, Exercise limited by: None identified  Goals    . DIET - INCREASE WATER INTAKE     Recommend to start drinking at least 2 to 4 glasses of water a day.        Fall Risk: Fall Risk  03/31/2019 03/25/2018 07/29/2017  Falls in the past year? 0 No No    FALL RISK PREVENTION PERTAINING TO THE HOME:  Any stairs in or around the home? Yes  If so, are there any without handrails? Yes   Home free of loose throw rugs in walkways, pet beds, electrical cords, etc? Yes  Adequate lighting in your home to reduce risk of falls? Yes   ASSISTIVE DEVICES UTILIZED TO PREVENT FALLS:  Life alert? No  Use of a cane, walker or w/c? No  Grab bars in the bathroom? No  Shower chair or bench in shower? No  Elevated toilet seat or a handicapped toilet? No    TIMED UP AND GO:  Was the test performed? No .    Depression Screen PHQ 2/9 Scores 03/31/2019 03/31/2019 03/25/2018 07/29/2017  PHQ - 2 Score 0 0 0 0  PHQ- 9 Score 1 - - -     Cognitive Function: Declined today.         There is no immunization history on file for this patient.  Qualifies for Shingles Vaccine? Yes . Due for Shingrix.  Education has been provided regarding the importance of this vaccine. Pt has been advised to call insurance company to determine out of pocket expense. Advised may also receive vaccine at local pharmacy or Health Dept. Verbalized acceptance and understanding.  Tdap: Although this vaccine is not a covered service during a Wellness Exam, does the patient still wish to receive this vaccine today?  No .  Advised may receive this vaccine at local pharmacy or Health Dept. Aware to provide a copy of the vaccination record if obtained from local pharmacy or  Health Dept. Verbalized acceptance and understanding.  Flu Vaccine: Due for Flu vaccine. Does the patient want to receive this vaccine today?  No .  Advised may receive this vaccine at local pharmacy or Health Dept. Aware to provide a copy of the vaccination record if obtained from local pharmacy or Health Dept. Verbalized acceptance and understanding.  Pneumococcal Vaccine: Due for Pneumococcal vaccine. Does the patient want to receive this vaccine today?  No .  Advised may receive this vaccine at local pharmacy or Health Dept. Aware to provide a copy of the vaccination record if obtained from local pharmacy or Health Dept. Verbalized acceptance and understanding.   Screening Tests Health Maintenance  Topic Date Due  . TETANUS/TDAP  04/30/1952  . DEXA SCAN  04/30/1998  . PNA vac Low Risk Adult (1 of 2 - PCV13) 04/30/1998  . INFLUENZA VACCINE  06/05/2019    Cancer Screenings:  Colorectal Screening: No longer required.   Mammogram: No longer required.   Bone Density:  Currently due, pt declined a referral for this at this time.   Lung Cancer Screening: (Low Dose CT Chest recommended if Age 83-80 years, 30 pack-year currently smoking OR have quit w/in 15years.) does not qualify.   Additional Screening:  Vision Screening: Recommended annual ophthalmology exams for early detection of glaucoma and other disorders of the eye.  Dental Screening:  Recommended annual dental exams for proper oral hygiene  Community Resource Referral:  CRR required this visit?  No       Plan:  I have personally reviewed and addressed the Medicare Annual Wellness questionnaire and have noted the following in the patient's chart:  A. Medical and social history B. Use of alcohol, tobacco or illicit drugs  C. Current medications and supplements D. Functional ability and status E.  Nutritional status F.  Physical activity G. Advance directives H. List of other physicians I.  Hospitalizations, surgeries, and ER visits in previous 12 months J.  [place] such as hearing and vision if needed, cognitive and depression L. Referrals and appointments   In addition, I have reviewed and discussed with patient certain preventive protocols, quality metrics, and best practice recommendations. A written personalized care plan for preventive services as well as general preventive health recommendations were provided to patient. Nurse Health Advisor  Signed,    Louvinia Cumbo [place], [place]  1/61/0960 Nurse Health Advisor   Nurse Notes: Pt declined the DEXA scan order, future tetanus vaccine and future pneumonia vaccine order.   Reviewed Nurse Health Advisor note and recommendations. Agree with documentation and plan.

## 2019-09-02 ENCOUNTER — Ambulatory Visit (INDEPENDENT_AMBULATORY_CARE_PROVIDER_SITE_OTHER): Payer: Medicare Other | Admitting: Family Medicine

## 2019-09-02 ENCOUNTER — Other Ambulatory Visit: Payer: Self-pay

## 2019-09-02 ENCOUNTER — Encounter: Payer: Self-pay | Admitting: Family Medicine

## 2019-09-02 VITALS — BP 92/64 | HR 160 | Temp 97.3°F | Resp 16 | Ht 64.0 in | Wt 130.0 lb

## 2019-09-02 DIAGNOSIS — R739 Hyperglycemia, unspecified: Secondary | ICD-10-CM | POA: Insufficient documentation

## 2019-09-02 DIAGNOSIS — Z23 Encounter for immunization: Secondary | ICD-10-CM | POA: Diagnosis not present

## 2019-09-02 DIAGNOSIS — R7303 Prediabetes: Secondary | ICD-10-CM | POA: Insufficient documentation

## 2019-09-02 DIAGNOSIS — H6121 Impacted cerumen, right ear: Secondary | ICD-10-CM

## 2019-09-02 DIAGNOSIS — I10 Essential (primary) hypertension: Secondary | ICD-10-CM | POA: Diagnosis not present

## 2019-09-02 DIAGNOSIS — E782 Mixed hyperlipidemia: Secondary | ICD-10-CM | POA: Diagnosis not present

## 2019-09-02 DIAGNOSIS — Z0000 Encounter for general adult medical examination without abnormal findings: Secondary | ICD-10-CM | POA: Diagnosis not present

## 2019-09-02 NOTE — Assessment & Plan Note (Signed)
Well controlled Continue current medications Recheck metabolic panel F/u in 6 months  

## 2019-09-02 NOTE — Patient Instructions (Signed)
Preventive Care 83 Years and Older, Female Preventive care refers to lifestyle choices and visits with your health care provider that can promote health and wellness. This includes:  A yearly physical exam. This is also called an annual well check.  Regular dental and eye exams.  Immunizations.  Screening for certain conditions.  Healthy lifestyle choices, such as diet and exercise. What can I expect for my preventive care visit? Physical exam Your health care provider will check:  Height and weight. These may be used to calculate body mass index (BMI), which is a measurement that tells if you are at a healthy weight.  Heart rate and blood pressure.  Your skin for abnormal spots. Counseling Your health care provider may ask you questions about:  Alcohol, tobacco, and drug use.  Emotional well-being.  Home and relationship well-being.  Sexual activity.  Eating habits.  History of falls.  Memory and ability to understand (cognition).  Work and work Statistician.  Pregnancy and menstrual history. What immunizations do I need?  Influenza (flu) vaccine  This is recommended every year. Tetanus, diphtheria, and pertussis (Tdap) vaccine  You may need a Td booster every 10 years. Varicella (chickenpox) vaccine  You may need this vaccine if you have not already been vaccinated. Zoster (shingles) vaccine  You may need this after age 33. Pneumococcal conjugate (PCV13) vaccine  One dose is recommended after age 33. Pneumococcal polysaccharide (PPSV23) vaccine  One dose is recommended after age 72. Measles, mumps, and rubella (MMR) vaccine  You may need at least one dose of MMR if you were born in 1957 or later. You may also need a second dose. Meningococcal conjugate (MenACWY) vaccine  You may need this if you have certain conditions. Hepatitis A vaccine  You may need this if you have certain conditions or if you travel or work in places where you may be exposed  to hepatitis A. Hepatitis B vaccine  You may need this if you have certain conditions or if you travel or work in places where you may be exposed to hepatitis B. Haemophilus influenzae type b (Hib) vaccine  You may need this if you have certain conditions. You may receive vaccines as individual doses or as more than one vaccine together in one shot (combination vaccines). Talk with your health care provider about the risks and benefits of combination vaccines. What tests do I need? Blood tests  Lipid and cholesterol levels. These may be checked every 5 years, or more frequently depending on your overall health.  Hepatitis C test.  Hepatitis B test. Screening  Lung cancer screening. You may have this screening every year starting at age 39 if you have a 30-pack-year history of smoking and currently smoke or have quit within the past 15 years.  Colorectal cancer screening. All adults should have this screening starting at age 36 and continuing until age 15. Your health care provider may recommend screening at age 23 if you are at increased risk. You will have tests every 1-10 years, depending on your results and the type of screening test.  Diabetes screening. This is done by checking your blood sugar (glucose) after you have not eaten for a while (fasting). You may have this done every 1-3 years.  Mammogram. This may be done every 1-2 years. Talk with your health care provider about how often you should have regular mammograms.  BRCA-related cancer screening. This may be done if you have a family history of breast, ovarian, tubal, or peritoneal cancers.  Other tests  Sexually transmitted disease (STD) testing.  Bone density scan. This is done to screen for osteoporosis. You may have this done starting at age 55. Follow these instructions at home: Eating and drinking  Eat a diet that includes fresh fruits and vegetables, whole grains, lean protein, and low-fat dairy products. Limit  your intake of foods with high amounts of sugar, saturated fats, and salt.  Take vitamin and mineral supplements as recommended by your health care provider.  Do not drink alcohol if your health care provider tells you not to drink.  If you drink alcohol: ? Limit how much you have to 0-1 drink a day. ? Be aware of how much alcohol is in your drink. In the [place] one drink equals one 12 oz bottle of beer (355 mL), one 5 oz glass of wine (148 mL), or one 1 oz glass of hard liquor (44 mL). Lifestyle  Take daily care of your teeth and gums.  Stay active. Exercise for at least 30 minutes on 5 or more days each week.  Do not use any products that contain nicotine or tobacco, such as cigarettes, e-cigarettes, and chewing tobacco. If you need help quitting, ask your health care provider.  If you are sexually active, practice safe sex. Use a condom or other form of protection in order to prevent STIs (sexually transmitted infections).  Talk with your health care provider about taking a low-dose aspirin or statin. What's next?  Go to your health care provider once a year for a well check visit.  Ask your health care provider how often you should have your eyes and teeth checked.  Stay up to date on all vaccines. This information is not intended to replace advice given to you by your health care provider. Make sure you discuss any questions you have with your health care provider. Document Released: 11/17/2015 Document Revised: 10/15/2018 Document Reviewed: 10/15/2018 Elsevier Patient Education  2020 [employer].

## 2019-09-02 NOTE — Assessment & Plan Note (Signed)
Noted on last metabolic panel Unclear if she was fasting at the time Check A1c

## 2019-09-02 NOTE — Progress Notes (Signed)
Patient: Marissa Gilmore, Female    DOB: 1931-09-29, 85 y.o.   MRN: [id number] Visit Date: 09/02/2019  Today's Provider: Lavon Paganini, MD   Chief Complaint  Patient presents with  . Annual Exam   Subjective:     Annual wellness visit Marissa Gilmore is a 85 y.o. female. She feels fairly well. She reports exercising no. She reports she is sleeping well. 03/31/2019 AWE with Alyson Ingles 08/31/2018 CPE  She declines pneumonia vaccination, DEXA -----------------------------------------------------------   Review of Systems  Constitutional: Positive for activity change, appetite change and fatigue.  HENT: Negative.   Eyes: Negative.   Respiratory: Positive for shortness of breath.   Cardiovascular: Positive for leg swelling.  Gastrointestinal: Negative.   Endocrine: Negative.   Genitourinary: Negative.   Musculoskeletal: Negative.   Skin: Negative.   Allergic/Immunologic: Negative.   Neurological: Negative.   Hematological: Bruises/bleeds easily.  Psychiatric/Behavioral: The patient is nervous/anxious.     Social History   Socioeconomic History  . Marital status: Single    Spouse name: Not on file  . Number of children: 0  . Years of education: Not on file  . Highest education level: 12th grade  Occupational History  . Occupation: retired    Comment: Control and instrumentation engineer  Social Needs  . Financial resource strain: Not hard at all  . Food insecurity    Worry: Never true    Inability: Never true  . Transportation needs    Medical: No    Non-medical: No  Tobacco Use  . Smoking status: Never Smoker  . Smokeless tobacco: Never Used  Substance and Sexual Activity  . Alcohol use: No  . Drug use: No  . Sexual activity: Not on file  Lifestyle  . Physical activity    Days per week: 0 days    Minutes per session: 0 min  . Stress: Only a little  Relationships  . Social Herbalist on phone: Patient refused    Gets together: Patient refused     Attends religious service: Patient refused    Active member of club or organization: Patient refused    Attends meetings of clubs or organizations: Patient refused    Relationship status: Patient refused  . Intimate partner violence    Fear of current or ex partner: Patient refused    Emotionally abused: Patient refused    Physically abused: Patient refused    Forced sexual activity: Patient refused  Other Topics Concern  . Not on file  Social History Narrative  . Not on file    Past Medical History:  Diagnosis Date  . Anxiety   . Cataract   . Hypertension      Patient Active Problem List   Diagnosis Date Noted  . Mixed hyperlipidemia 09/02/2019  . Hyperglycemia 09/02/2019  . Osteoarthritis 11/14/2017  . Allergic rhinitis 10/03/2017  . Hypertension 08/22/2017  . Fatigue 08/22/2017    Past Surgical History:  Procedure Laterality Date  . CATARACT EXTRACTION Right     Her family history includes Bladder Cancer (age of onset: 46) in her father; Heart disease in her mother; Peripheral Artery Disease (age of onset: 39) in her mother.   Current Outpatient Medications:  .  ELIQUIS 5 MG TABS tablet, SMARTSIG:1 Tablet(s) By Mouth Every 12 Hours, Disp: , Rfl:  .  furosemide (LASIX) 20 MG tablet, Take 1 tablet (20 mg total) by mouth daily., Disp: 30 tablet, Rfl: 3 .  metoprolol tartrate (LOPRESSOR) 25 MG tablet, Take  by mouth., Disp: , Rfl:  .  sacubitril-valsartan (ENTRESTO) 24-26 MG, Take by mouth., Disp: , Rfl:   Patient Care Team: Virginia Crews, MD as PCP - General (Family Medicine) Yolonda Kida, MD as Consulting Physician (Cardiology)    Objective:    Vitals: BP 92/64 (BP Location: Left Arm, Patient Position: Sitting, Cuff Size: Normal)   Pulse (!) 160   Temp (!) 97.3 F (36.3 C) (Temporal)   Resp 16   Ht 5\' 4"  (1.626 m)   Wt 130 lb (59 kg)   SpO2 98%   BMI 22.31 kg/m   Physical Exam Vitals signs reviewed.  Constitutional:      General: She  is not in acute distress.    Appearance: Normal appearance. She is well-developed. She is not diaphoretic.  HENT:     Head: Normocephalic and atraumatic.     Right Ear: External ear normal. There is impacted cerumen.     Left Ear: Tympanic membrane, ear canal and external ear normal.  Eyes:     General: No scleral icterus.    Conjunctiva/sclera: Conjunctivae normal.     Pupils: Pupils are equal, round, and reactive to light.  Neck:     Musculoskeletal: Neck supple.     Thyroid: No thyromegaly.  Cardiovascular:     Rate and Rhythm: Normal rate and regular rhythm.     Pulses: Normal pulses.     Heart sounds: Normal heart sounds. No murmur.  Pulmonary:     Effort: Pulmonary effort is normal. No respiratory distress.     Breath sounds: Normal breath sounds. No wheezing or rales.  Abdominal:     General: There is no distension.     Palpations: Abdomen is soft.     Tenderness: There is no abdominal tenderness.  Musculoskeletal:        General: No deformity.     Right lower leg: No edema.     Left lower leg: No edema.  Lymphadenopathy:     Cervical: No cervical adenopathy.  Skin:    General: Skin is warm and dry.     Capillary Refill: Capillary refill takes less than 2 seconds.     Findings: No rash.  Neurological:     Mental Status: She is alert and oriented to person, place, and time. Mental status is at baseline.  Psychiatric:        Mood and Affect: Mood normal.        Behavior: Behavior normal.        Thought Content: Thought content normal.     Activities of Daily Living In your present state of health, do you have any difficulty performing the following activities: 03/31/2019  Hearing? N  Vision? N  Difficulty concentrating or making decisions? N  Walking or climbing stairs? N  Comment Due to recent leg swelling.   Dressing or bathing? N  Doing errands, shopping? N  Preparing Food and eating ? N  Using the Toilet? N  In the past six months, have you accidently  leaked urine? N  Do you have problems with loss of bowel control? N  Managing your Medications? N  Managing your Finances? N  Housekeeping or managing your Housekeeping? N  Some recent data might be hidden    Fall Risk Assessment Fall Risk  09/02/2019 03/31/2019 03/25/2018 07/29/2017  Falls in the past year? 0 0 No No  Number falls in past yr: 0 - - -  Injury with Fall? 0 - - -  Follow up Falls evaluation completed - - -     Depression Screen PHQ 2/9 Scores 03/31/2019 03/31/2019 03/25/2018 07/29/2017  PHQ - 2 Score 0 0 0 0  PHQ- 9 Score 1 - - -    No flowsheet data found.    Assessment & Plan:     Annual Wellness Visit  Reviewed patient's Family Medical History Reviewed and updated list of patient's medical providers Assessment of cognitive impairment was done Assessed patient's functional ability Established a written schedule for health screening [place] Completed and Reviewed  Exercise Activities and Dietary recommendations Goals    . DIET - INCREASE WATER INTAKE     Recommend to start drinking at least 2 to 4 glasses of water a day.        There is no immunization history for the selected administration types on file for this patient.  Health Maintenance  Topic Date Due  . TETANUS/TDAP  04/30/1952  . DEXA SCAN  04/30/1998  . PNA vac Low Risk Adult (1 of 2 - PCV13) 04/30/1998  . INFLUENZA VACCINE  06/05/2019     Discussed health benefits of physical activity, and encouraged her to engage in regular exercise appropriate for her age and condition.    ------------------------------------------------------------------------------------------------------------  Problem List Items Addressed This Visit      Cardiovascular and Mediastinum   Hypertension    Well controlled Continue current medications Recheck metabolic panel F/u in 6 months       Relevant Medications   ELIQUIS 5 MG TABS tablet   metoprolol tartrate (LOPRESSOR) 25 MG  tablet   sacubitril-valsartan (ENTRESTO) 24-26 MG     Other   Mixed hyperlipidemia    Not currently on a statin Check lipid panel and CMP      Relevant Medications   ELIQUIS 5 MG TABS tablet   metoprolol tartrate (LOPRESSOR) 25 MG tablet   sacubitril-valsartan (ENTRESTO) 24-26 MG   Other Relevant Orders   Lipid panel   Hyperglycemia    Noted on last metabolic panel Unclear if she was fasting at the time Check A1c      Relevant Orders   Hemoglobin A1c    Other Visit Diagnoses    Encounter for annual physical exam    -  Primary   Relevant Orders   Lipid panel   Hemoglobin A1c   Need for influenza vaccination       Relevant Orders   Flu Vaccine QUAD High Dose(Fluad)   Impacted cerumen of right ear        - irrigated after visit by CMA - TM clear after removal - patient tolerated procedure well   Return in about 6 months (around 03/02/2020) for chronic disease f/u.   The entirety of the information documented in the History of Present Illness, Review of Systems and Physical Exam were personally obtained by me. Portions of this information were initially documented by Lynford Humphrey, CMA and reviewed by me for thoroughness and accuracy.    , Dionne Bucy, MD MPH [facility]

## 2019-09-02 NOTE — Assessment & Plan Note (Signed)
Not currently on a statin Check lipid panel and CMP

## 2019-09-03 LAB — LIPID PANEL
Chol/HDL Ratio: 3.8 ratio (ref 0.0–4.4)
Cholesterol, Total: 262 mg/dL — ABNORMAL HIGH (ref 100–199)
HDL: 69 mg/dL (ref >39)
LDL Chol Calc (NIH): 166 mg/dL — ABNORMAL HIGH (ref 0–99)
Triglycerides: 149 mg/dL (ref 0–149)
VLDL Cholesterol Cal: 27 mg/dL (ref 5–40)

## 2019-09-03 LAB — HEMOGLOBIN A1C
Est. average glucose Bld gHb Est-mCnc: 126 mg/dL
Hgb A1c MFr Bld: 6 % — ABNORMAL HIGH (ref 4.8–5.6)

## 2019-09-06 ENCOUNTER — Telehealth: Payer: Self-pay

## 2019-09-06 MED ORDER — ATORVASTATIN CALCIUM 40 MG PO TABS: 40.0000 mg | ORAL_TABLET | Freq: Every day | ORAL | 3 refills | Status: DC

## 2019-09-06 NOTE — Telephone Encounter (Signed)
Pt advised.  She agreed to start atorvastatin 40mg .  RX was sent to [facility] [address] will call in three months to get her labs drawn.   Thanks,   -laura

## 2019-09-06 NOTE — Telephone Encounter (Signed)
-----   Message from Virginia Crews, MD sent at 09/03/2019  4:43 PM EDT ----- Cholesterol is high.  Patient really needs a statin to lower her heart disease and stroke risk.  If she is agreeable, we can E prescribe atorvastatin 40 mg daily.  Okay to send in a 90-day supply with 3 refills.  Would recheck her cholesterol and CMP in about 3 months to ensure that it is decreasing appropriately.  Her hemoglobin A1c, a 50-month average of blood sugars, is in the prediabetic range.  In order to prevent progression to diabetes, recommend regular exercise and low-carb diet.

## 2019-10-11 IMAGING — CR CHEST - 2 VIEW
1 series · 2 of 2 positions shown · non-contrast
Comparison: None.

CLINICAL DATA: Shortness of breath and cough

EXAM:
CHEST - 2 VIEW

[Series 1: dg chest 2 view · 0.14mm/px · 2 of 2 slices shown]
[im 1/2]
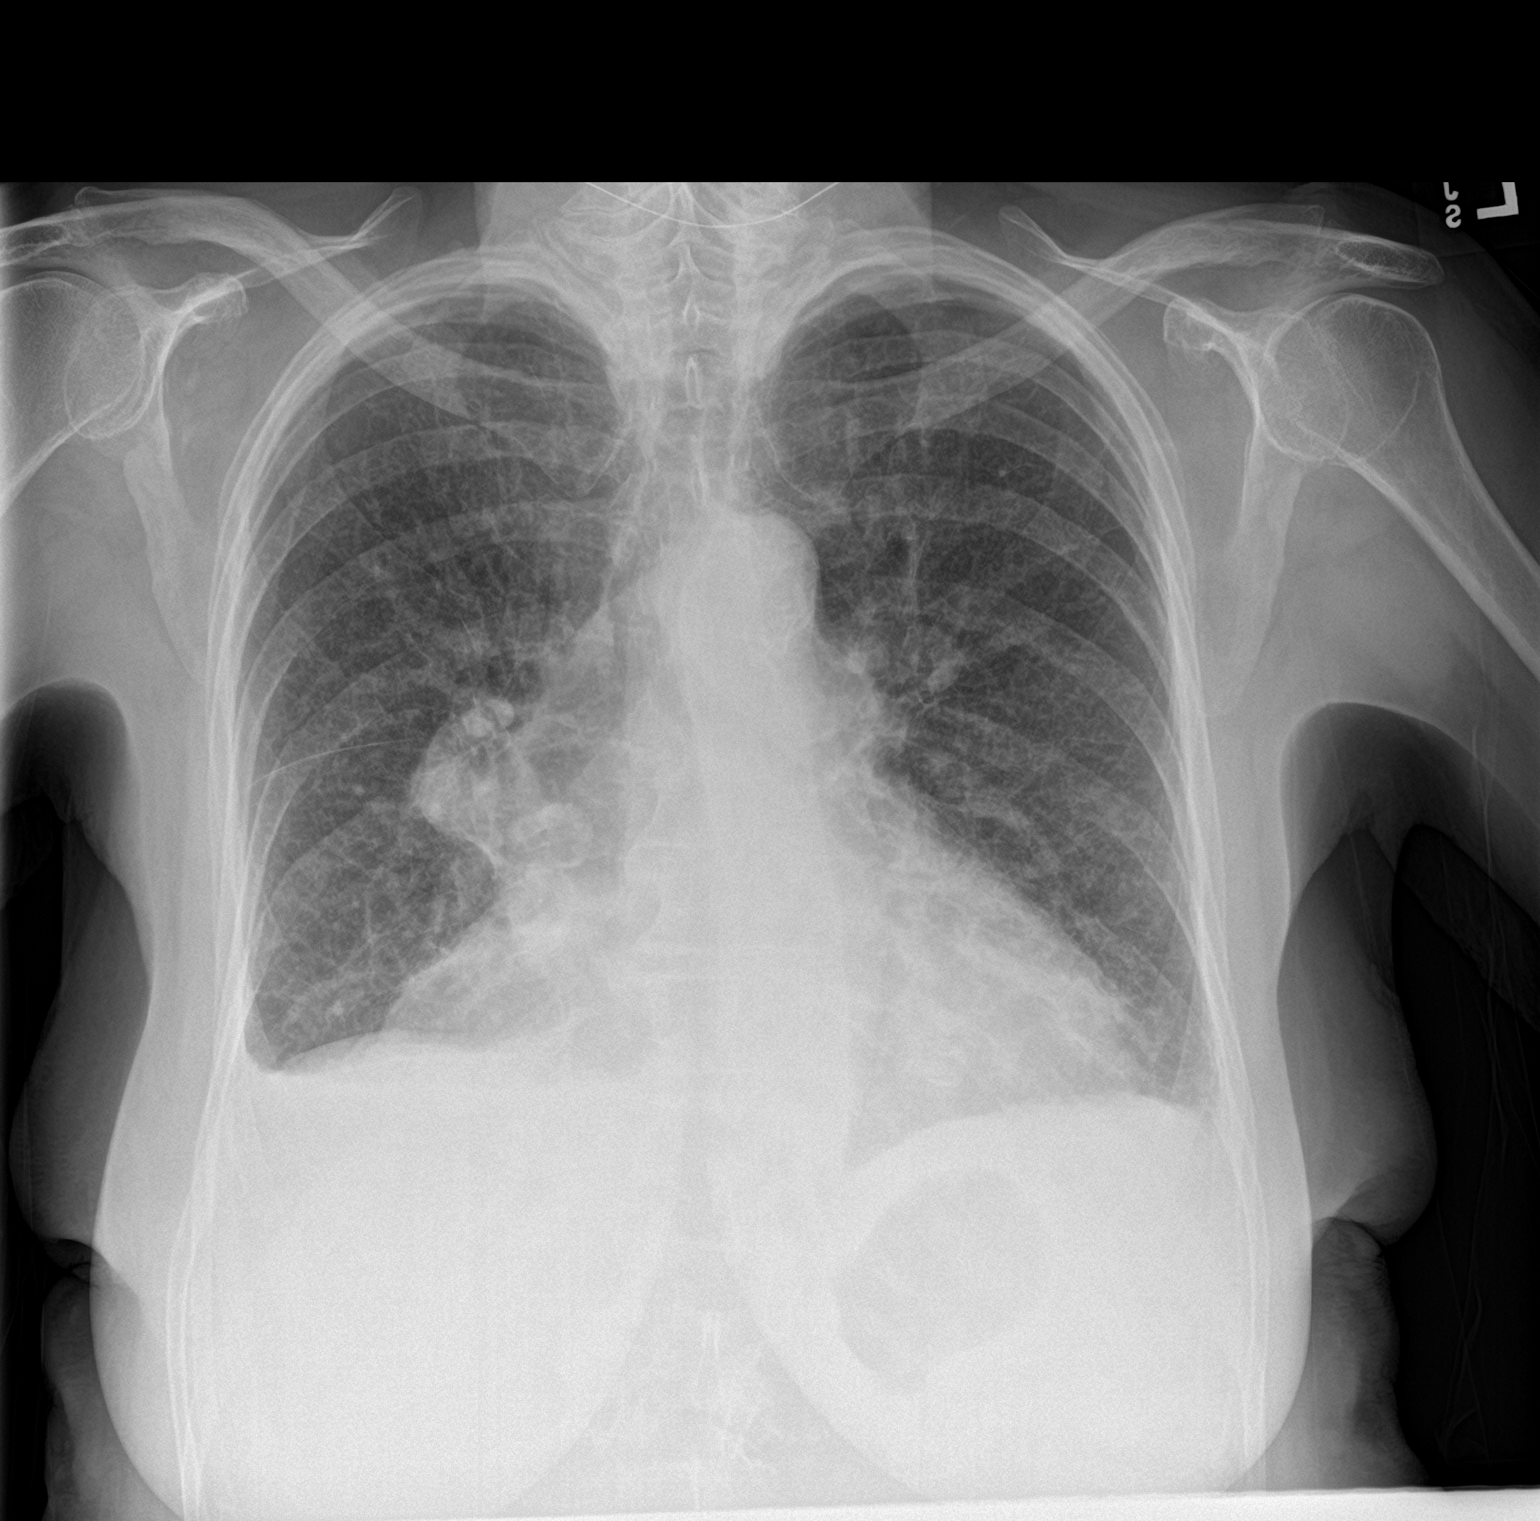
[im 2/2]
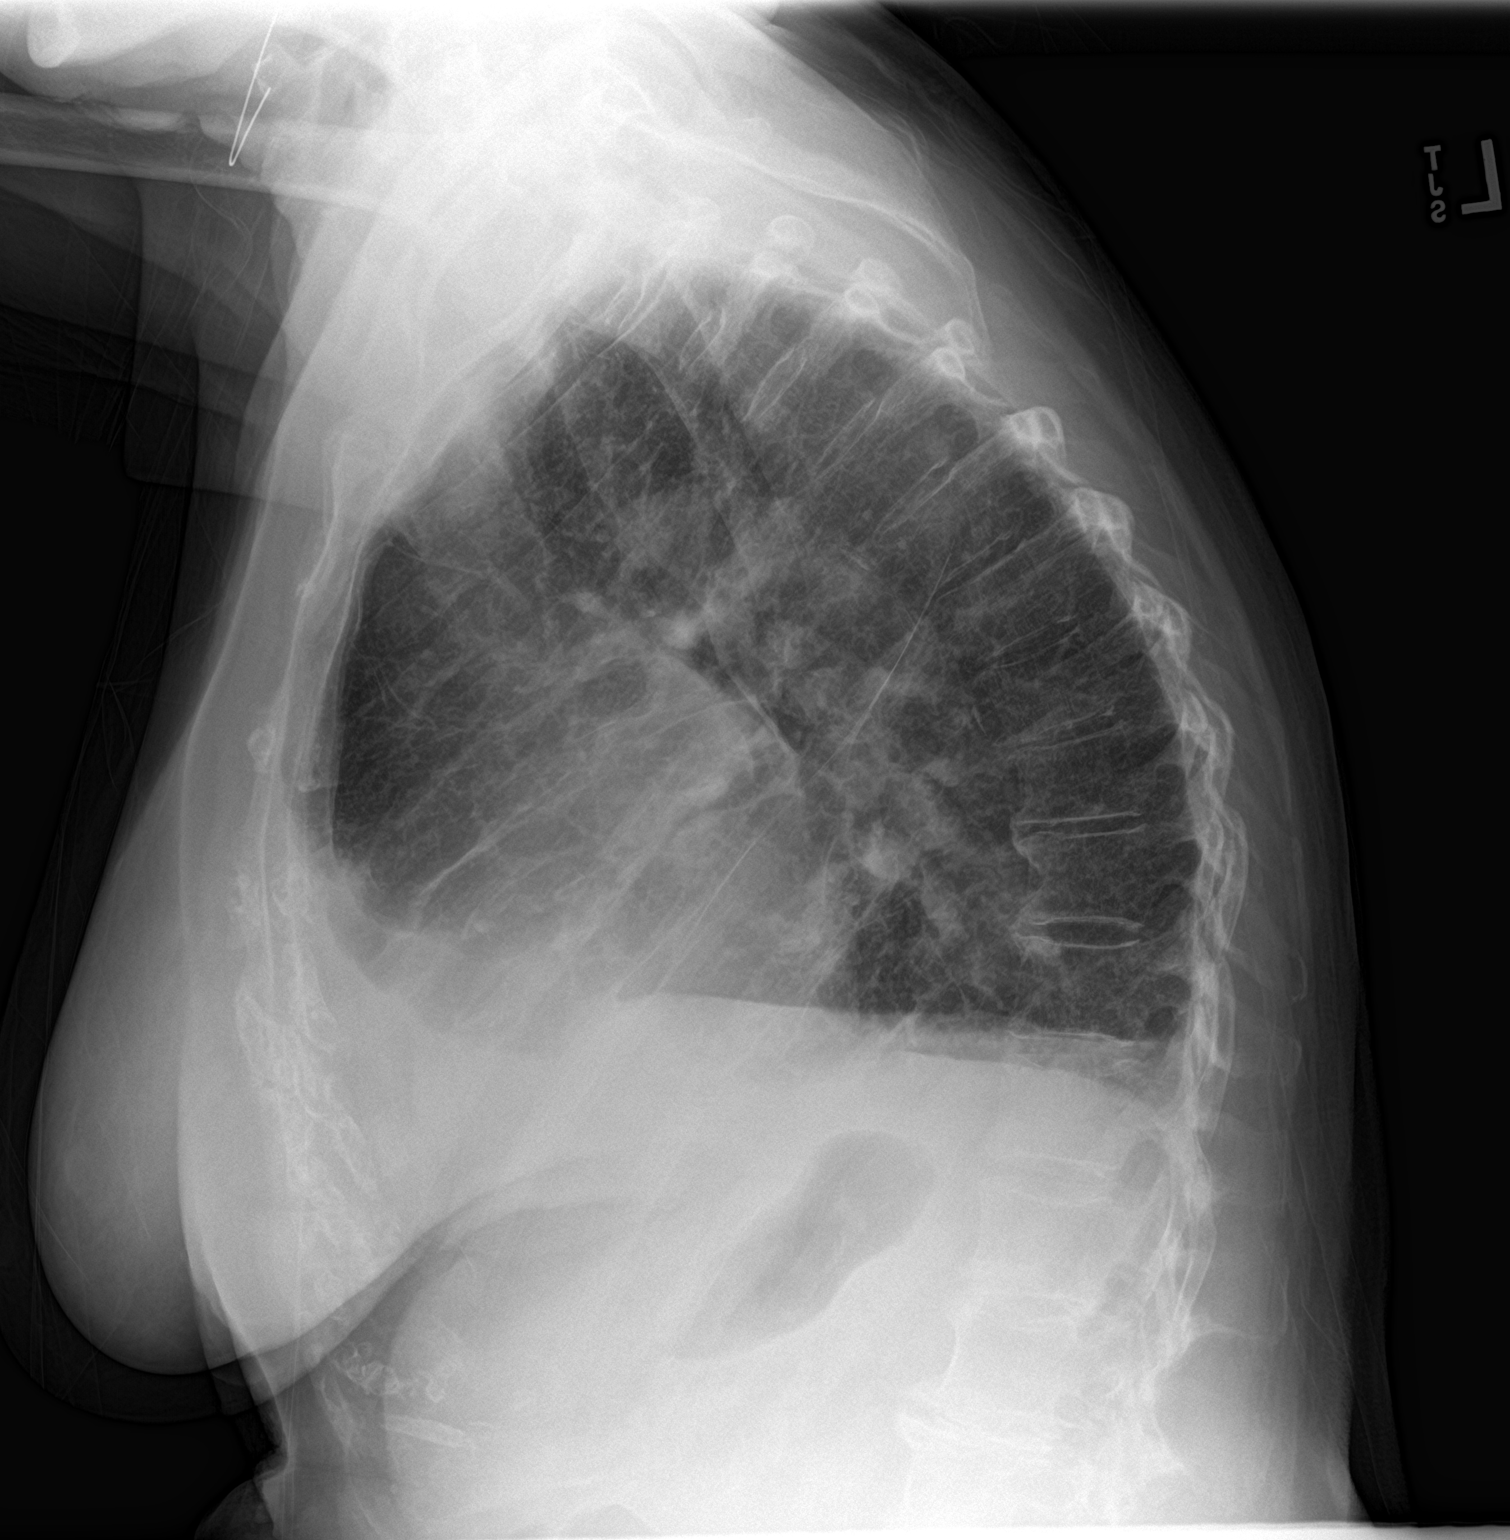

[2 of 2 positions shown; findings below may reference images not displayed]

FINDINGS: Cardiac shadow is enlarged. Aortic calcifications are noted.
Prominent density in the right hilum is noted likely related to
calcified lymph nodes. No focal infiltrate or effusion is seen. No
bony abnormality is seen.
IMPRESSION: Cardiomegaly.

Prominent calcifications in the right hilum likely related to
calcified lymph nodes. No other focal abnormality is seen.

## 2019-11-23 DIAGNOSIS — R600 Localized edema: Secondary | ICD-10-CM | POA: Insufficient documentation

## 2019-11-23 DIAGNOSIS — R0600 Dyspnea, unspecified: Secondary | ICD-10-CM | POA: Insufficient documentation

## 2019-11-23 DIAGNOSIS — R0609 Other forms of dyspnea: Secondary | ICD-10-CM | POA: Insufficient documentation

## 2020-03-09 ENCOUNTER — Ambulatory Visit: Payer: Medicare Other | Admitting: Family Medicine

## 2020-03-09 ENCOUNTER — Other Ambulatory Visit: Payer: Self-pay

## 2020-03-09 ENCOUNTER — Encounter: Payer: Self-pay | Admitting: Family Medicine

## 2020-03-09 VITALS — BP 122/72 | Temp 96.9°F | Ht 61.0 in | Wt 130.0 lb

## 2020-03-09 DIAGNOSIS — Z7901 Long term (current) use of anticoagulants: Secondary | ICD-10-CM | POA: Insufficient documentation

## 2020-03-09 DIAGNOSIS — R7303 Prediabetes: Secondary | ICD-10-CM | POA: Diagnosis not present

## 2020-03-09 DIAGNOSIS — I480 Paroxysmal atrial fibrillation: Secondary | ICD-10-CM | POA: Insufficient documentation

## 2020-03-09 DIAGNOSIS — I5022 Chronic systolic (congestive) heart failure: Secondary | ICD-10-CM | POA: Diagnosis not present

## 2020-03-09 DIAGNOSIS — E782 Mixed hyperlipidemia: Secondary | ICD-10-CM

## 2020-03-09 DIAGNOSIS — I10 Essential (primary) hypertension: Secondary | ICD-10-CM

## 2020-03-09 LAB — POCT GLYCOSYLATED HEMOGLOBIN (HGB A1C)
Est. average glucose Bld gHb Est-mCnc: 123
Hemoglobin A1C: 5.9 % — AB (ref 4.0–5.6)

## 2020-03-09 NOTE — Assessment & Plan Note (Signed)
Stable A1c today 5.9 a small improvement from last A1c Advised to continue working healthy lifestyle

## 2020-03-09 NOTE — Assessment & Plan Note (Addendum)
Stable, rate controlled currently Continue current medications Keep follow up with cardiology

## 2020-03-09 NOTE — Assessment & Plan Note (Addendum)
Previously well controlled Continue statin FLP and CMP checked on Jan. 2021

## 2020-03-09 NOTE — Assessment & Plan Note (Signed)
Continue eliquis for paroxysmal A Fib

## 2020-03-09 NOTE — Assessment & Plan Note (Addendum)
Stable and euvolemic Continue current meds Followed by Dr. Clayborn Bigness Patient to keep follow up as scheduled

## 2020-03-09 NOTE — Progress Notes (Signed)
Established patient visit   Patient: Marissa Gilmore   DOB: Jan 14, 1931   86 y.o. Female  MRN: [id number] Visit Date: 03/09/2020  I,Sulibeya S Dimas,acting as a scribe for Lavon Paganini, MD.,have documented all relevant documentation on the behalf of Lavon Paganini, MD,as directed by  Lavon Paganini, MD while in the presence of Lavon Paganini, MD.  Today's healthcare provider: Lavon Paganini, MD   Chief Complaint  Patient presents with  . Hypertension  . Hyperlipidemia  . Prediabetes   Subjective    HPI Hypertension, follow-up   BP Readings from Last 3 Encounters:  03/09/20 122/72  09/02/19 92/64  03/22/19 132/60   Wt Readings from Last 3 Encounters:  03/09/20 130 lb (59 kg)  09/02/19 130 lb (59 kg)  03/22/19 147 lb (66.7 kg)     She was last seen for hypertension 6 months ago.  BP at that visit was 92/64. Management since that visit includes no changes.  She reports excellent compliance with treatment. She is not having side effects.  She is following a Regular, Low Sodium diet. She is exercising. She does not smoke.  Use of agents associated with hypertension: none.   Outside blood pressures are stable. Symptoms: No chest pain No chest pressure No palpitations No dyspnea No orthopnea No paroxysmal nocturnal dyspnea No lower extremity edema No syncope   Pertinent labs: Lab Results  Component Value Date   CHOL 262 (H) 09/02/2019   HDL 69 09/02/2019   LDLCALC 166 (H) 09/02/2019   TRIG 149 09/02/2019   CHOLHDL 3.8 09/02/2019   Lab Results  Component Value Date   NA 141 03/23/2019   K 4.2 03/23/2019   CO2 17 (L) 03/23/2019   GLUCOSE 119 (H) 03/23/2019   BUN 20 03/23/2019   CREATININE 1.02 (H) 03/23/2019   CALCIUM 10.3 03/23/2019   GFRNONAA 50 (L) 03/23/2019   GFRAA 58 (L) 03/23/2019     The ASCVD Risk score (Goff DC Jr., et al., 2013) failed to calculate for the following reasons:   The 2013 ASCVD risk score is only  valid for ages 49 to 35   --------------------------------------------------------------------------------------------------- Lipid/Cholesterol, Follow-up  Last lipid panel Other pertinent labs  Lab Results  Component Value Date   CHOL 262 (H) 09/02/2019   HDL 69 09/02/2019   LDLCALC 166 (H) 09/02/2019   TRIG 149 09/02/2019   CHOLHDL 3.8 09/02/2019   Lab Results  Component Value Date   ALT 35 (H) 03/23/2019   AST 22 03/23/2019   PLT 209 03/23/2019   TSH 0.75 10/03/2017     She was last seen for this 6 months ago.  Management since that visit includes start Atorvastatin 40mg  daily.  She reports excellent compliance with treatment. She is not having side effects.  Symptoms: No chest pain No chest pressure/discomfort No dyspnea No lower extremity edema No numbness or tingling of extremity No orthopnea No palpitations No paroxysmal nocturnal dyspnea No speech difficulty No syncope  Current diet: in general, a "healthy" diet   Current exercise: walking  Wt Readings from Last 3 Encounters:  03/09/20 130 lb (59 kg)  09/02/19 130 lb (59 kg)  03/22/19 147 lb (66.7 kg)   The ASCVD Risk score Mikey Bussing DC Jr., et al., 2013) failed to calculate for the following reasons:   The 2013 ASCVD risk score is only valid for ages 12 to 3  ----------------------------------------------------------------------------------------- Prediabetes, Follow-up  Lab Results  Component Value Date   HGBA1C 5.9 (A) 03/09/2020  HGBA1C 6.0 (H) 09/02/2019   GLUCOSE 119 (H) 03/23/2019   GLUCOSE 102 (H) 10/03/2017   GLUCOSE 113 (H) 08/22/2017    Last seen for for this6 months ago.  Management since that visit includes check A1c. Current symptoms include none and have been stable.  Prior visit with dietician: no Current diet: in general, a "healthy" diet   Current exercise: walking  Pertinent Labs:    Component Value Date/Time   CHOL 262 (H) 09/02/2019 1556   TRIG 149 09/02/2019 1556    CHOLHDL 3.8 09/02/2019 1556   CHOLHDL 4.3 08/22/2017 1551   CREATININE 1.02 (H) 03/23/2019 1038   CREATININE 0.83 10/03/2017 1559    Wt Readings from Last 3 Encounters:  03/09/20 130 lb (59 kg)  09/02/19 130 lb (59 kg)  03/22/19 147 lb (66.7 kg)    -----------------------------------------------------------------------------------------    Patient Active Problem List   Diagnosis Date Noted  . Chronic systolic (congestive) heart failure ([place]) 03/09/2020  . Paroxysmal atrial fibrillation ([place]) 03/09/2020  . Mixed hyperlipidemia 09/02/2019  . Prediabetes 09/02/2019  . Osteoarthritis 11/14/2017  . Allergic rhinitis 10/03/2017  . Hypertension 08/22/2017  . Fatigue 08/22/2017   Social History   Tobacco Use  . Smoking status: Never Smoker  . Smokeless tobacco: Never Used  Substance Use Topics  . Alcohol use: No  . Drug use: No       Medications: Outpatient Medications Prior to Visit  Medication Sig  . atorvastatin (LIPITOR) 40 MG tablet Take 1 tablet (40 mg total) by mouth daily.  Marland Kitchen ELIQUIS 5 MG TABS tablet Take 5 mg by mouth 2 (two) times daily.   . furosemide (LASIX) 20 MG tablet Take 1 tablet (20 mg total) by mouth daily.  . metoprolol tartrate (LOPRESSOR) 25 MG tablet Take 25 mg by mouth 2 (two) times daily.   . sacubitril-valsartan (ENTRESTO) 24-26 MG Take 1 tablet by mouth 2 (two) times daily.    No facility-administered medications prior to visit.    Review of Systems  Constitutional: Negative.   Eyes: Negative.   Respiratory: Negative.   Cardiovascular: Negative.   Gastrointestinal: Negative for abdominal pain, constipation and diarrhea.  Endocrine: Negative.     Last metabolic panel Lab Results  Component Value Date   GLUCOSE 119 (H) 03/23/2019   NA 141 03/23/2019   K 4.2 03/23/2019   CL 108 (H) 03/23/2019   CO2 17 (L) 03/23/2019   BUN 20 03/23/2019   CREATININE 1.02 (H) 03/23/2019   GFRNONAA 50 (L) 03/23/2019   GFRAA 58 (L) 03/23/2019    CALCIUM 10.3 03/23/2019   PROT 6.3 03/23/2019   ALBUMIN 4.3 03/23/2019   LABGLOB 2.0 03/23/2019   AGRATIO 2.2 03/23/2019   BILITOT 0.9 03/23/2019   ALKPHOS 102 03/23/2019   AST 22 03/23/2019   ALT 35 (H) 03/23/2019   ANIONGAP 10 07/21/2017   Last lipids Lab Results  Component Value Date   CHOL 262 (H) 09/02/2019   HDL 69 09/02/2019   LDLCALC 166 (H) 09/02/2019   TRIG 149 09/02/2019   CHOLHDL 3.8 09/02/2019   Last hemoglobin A1c Lab Results  Component Value Date   HGBA1C 5.9 (A) 03/09/2020      Objective    BP 122/72 (BP Location: Left Arm, Patient Position: Sitting, Cuff Size: Normal)   Temp (!) 96.9 F (36.1 C) (Temporal)   Ht 5\' 1"  (1.549 m)   Wt 130 lb (59 kg)   BMI 24.56 kg/m  BP Readings from Last 3 Encounters:  03/09/20 122/72  09/02/19 92/64  03/22/19 132/60   Wt Readings from Last 3 Encounters:  03/09/20 130 lb (59 kg)  09/02/19 130 lb (59 kg)  03/22/19 147 lb (66.7 kg)      Physical Exam Vitals reviewed.  Constitutional:      General: She is not in acute distress.    Appearance: Normal appearance. She is well-developed. She is not diaphoretic.  HENT:     Head: Normocephalic and atraumatic.  Eyes:     General: No scleral icterus.    Conjunctiva/sclera: Conjunctivae normal.  Neck:     Thyroid: No thyromegaly.  Cardiovascular:     Rate and Rhythm: Normal rate. Rhythm irregularly irregular.     Pulses: Normal pulses.     Heart sounds: Normal heart sounds. No murmur.  Pulmonary:     Effort: Pulmonary effort is normal. No respiratory distress.     Breath sounds: Normal breath sounds. No wheezing, rhonchi or rales.  Musculoskeletal:     Cervical back: Neck supple.     Right lower leg: No edema.     Left lower leg: No edema.  Lymphadenopathy:     Cervical: No cervical adenopathy.  Skin:    General: Skin is warm and dry.     Findings: No rash.  Neurological:     Mental Status: She is alert and oriented to person, place, and time. Mental  status is at baseline.  Psychiatric:        Mood and Affect: Mood normal.        Behavior: Behavior normal.      Results for orders placed or performed in visit on 03/09/20  POCT glycosylated hemoglobin (Hb A1C)  Result Value Ref Range   Hemoglobin A1C 5.9 (A) 4.0 - 5.6 %   Est. average glucose Bld gHb Est-mCnc 123     Assessment & Plan     Problem List Items Addressed This Visit      Cardiovascular and Mediastinum   Hypertension - Primary    Well controlled  Labs checked in Jan. 2021 by Dr. Clayborn Bigness Continue current medications F/u in 6 months       Chronic systolic (congestive) heart failure ([place])    Stable Followed by Dr. Clayborn Bigness Patient to keep follow up as scheduled      Paroxysmal atrial fibrillation ([place])    Stable Continue current medications Keep follow up with cardiology         Other   Mixed hyperlipidemia    Previously well controlled Continue statin FLP and CMP checked on Jan. 2021        Prediabetes    Stable A1c today 5.9 a small improvement from last A1c Advised to continue working healthy lifestyle       Relevant Orders   POCT glycosylated hemoglobin (Hb A1C) (Completed)       Return in about 6 months (around 09/09/2020) for CPE, chronic disease f/u.      I, Lavon Paganini, MD, have reviewed all documentation for this visit. The documentation on 03/09/20 for the exam, diagnosis, procedures, and orders are all accurate and complete.   Bacigalupo, Dionne Bucy, MD, MPH [facility]

## 2020-03-09 NOTE — Assessment & Plan Note (Addendum)
Well controlled  Labs checked in Jan. 2021 by Dr. Clayborn Bigness Continue current medications F/u in 6 months

## 2020-08-08 ENCOUNTER — Other Ambulatory Visit: Payer: Self-pay | Admitting: Family Medicine

## 2020-08-08 NOTE — Telephone Encounter (Signed)
Requested Prescriptions  Pending Prescriptions Disp Refills   atorvastatin (LIPITOR) 40 MG tablet [Pharmacy Med Name: ATORVASTATIN 40 MG TABLET] 90 tablet 3    Sig: TAKE 1 TABLET BY MOUTH EVERY DAY     Cardiovascular:  Antilipid - Statins Failed - 08/08/2020  8:31 AM      Failed - Total Cholesterol in normal range and within 360 days    Cholesterol, Total  Date Value Ref Range Status  09/02/2019 262 (H) 100 - 199 mg/dL Final         Failed - LDL in normal range and within 360 days    LDL Cholesterol (Calc)  Date Value Ref Range Status  08/22/2017 175 (H) mg/dL (calc) Final    Comment:    Reference range: <100 . Desirable range <100 mg/dL for primary prevention;   <70 mg/dL for patients with CHD or diabetic patients  with > or = 2 CHD risk factors. Marland Kitchen LDL-C is now calculated using the Martin-Hopkins  calculation, which is a validated novel method providing  better accuracy than the Friedewald equation in the  estimation of LDL-C.  Cresenciano Genre et al. Annamaria Helling. 3704;888(91): 2061-2068  (http://education.[employer].com/faq/FAQ164)    LDL Chol Calc (NIH)  Date Value Ref Range Status  09/02/2019 166 (H) 0 - 99 mg/dL Final         Passed - HDL in normal range and within 360 days    HDL  Date Value Ref Range Status  09/02/2019 69 >39 mg/dL Final         Passed - Triglycerides in normal range and within 360 days    Triglycerides  Date Value Ref Range Status  09/02/2019 149 0 - 149 mg/dL Final         Passed - Patient is not pregnant      Passed - Valid encounter within last 12 months    Recent Outpatient Visits          5 months ago Essential hypertension   [place], Dionne Bucy, MD   11 months ago Encounter for annual physical exam   [facility] [place], Dionne Bucy, MD   1 year ago Peripheral edema   [place], Vickki Muff, [place]   1 year ago Encounter for annual physical exam   [facility] [place], Dionne Bucy, MD   2 years ago Essential hypertension   [place], Dionne Bucy, MD      Future Appointments            In 1 month Bacigalupo, Dionne Bucy, MD [facility] [place]

## 2020-09-06 ENCOUNTER — Encounter: Payer: Self-pay | Admitting: Family Medicine

## 2020-09-08 ENCOUNTER — Encounter: Payer: Self-pay | Admitting: Family Medicine

## 2020-09-11 ENCOUNTER — Other Ambulatory Visit: Payer: Self-pay

## 2020-09-11 ENCOUNTER — Ambulatory Visit (INDEPENDENT_AMBULATORY_CARE_PROVIDER_SITE_OTHER): Payer: Medicare Other | Admitting: Family Medicine

## 2020-09-11 ENCOUNTER — Encounter: Payer: Self-pay | Admitting: Family Medicine

## 2020-09-11 VITALS — BP 136/76 | HR 60 | Temp 97.9°F | Resp 16 | Ht 61.0 in | Wt 134.0 lb

## 2020-09-11 DIAGNOSIS — Z0000 Encounter for general adult medical examination without abnormal findings: Secondary | ICD-10-CM | POA: Diagnosis not present

## 2020-09-11 DIAGNOSIS — I110 Hypertensive heart disease with heart failure: Secondary | ICD-10-CM

## 2020-09-11 DIAGNOSIS — Z23 Encounter for immunization: Secondary | ICD-10-CM | POA: Diagnosis not present

## 2020-09-11 DIAGNOSIS — I480 Paroxysmal atrial fibrillation: Secondary | ICD-10-CM

## 2020-09-11 DIAGNOSIS — I5022 Chronic systolic (congestive) heart failure: Secondary | ICD-10-CM | POA: Diagnosis not present

## 2020-09-11 DIAGNOSIS — I10 Essential (primary) hypertension: Secondary | ICD-10-CM

## 2020-09-11 DIAGNOSIS — R7303 Prediabetes: Secondary | ICD-10-CM | POA: Diagnosis not present

## 2020-09-11 DIAGNOSIS — E782 Mixed hyperlipidemia: Secondary | ICD-10-CM

## 2020-09-11 DIAGNOSIS — R609 Edema, unspecified: Secondary | ICD-10-CM

## 2020-09-11 DIAGNOSIS — R600 Localized edema: Secondary | ICD-10-CM

## 2020-09-11 DIAGNOSIS — Z7901 Long term (current) use of anticoagulants: Secondary | ICD-10-CM

## 2020-09-11 MED ORDER — FUROSEMIDE 40 MG PO TABS: 40.0000 mg | ORAL_TABLET | Freq: Every day | ORAL | 2 refills | Status: DC

## 2020-09-11 NOTE — Progress Notes (Signed)
Complete physical exam   Patient: Marissa Gilmore   DOB: 06/08/1931   86 y.o. Female  MRN: [id number] Visit Date: 09/11/2020  Today's healthcare provider: Lavon Paganini, MD   Chief Complaint  Patient presents with  . Medicare Wellness   Subjective    HPI   Emira has been experiencing decreased appetite. She reports wanting to be hungry, but has a limited desire to eat until she is fasted and food is placed directly in front of her. She has had no changes in taste or smell, no recent sick contacts or recent trauma. She denies N/V/D, new rashes, muscle aches, fevers or headaches.   Additionally, she has experienced some slightly worse dyspnea on extertion. Over the last few months she has been unable to cross a parking lot without stopping multiple times to catch her breath. She has had no cough or wheezing, but does note that she feels 'swollen'.     Past Medical History:  Diagnosis Date  . Anxiety   . Cataract   . Hypertension    Past Surgical History:  Procedure Laterality Date  . CATARACT EXTRACTION Right    Social History   Socioeconomic History  . Marital status: Single    Spouse name: Not on file  . Number of children: 0  . Years of education: Not on file  . Highest education level: 12th grade  Occupational History  . Occupation: retired    Comment: Control and instrumentation engineer  Tobacco Use  . Smoking status: Never Smoker  . Smokeless tobacco: Never Used  Vaping Use  . Vaping Use: Never used  Substance and Sexual Activity  . Alcohol use: No  . Drug use: No  . Sexual activity: Not on file  Other Topics Concern  . Not on file  Social History Narrative  . Not on file   Social Determinants of Health   Financial Resource Strain:   . Difficulty of Paying Living Expenses: Not on file  Food Insecurity:   . Worried About Charity fundraiser in the Last Year: Not on file  . Ran Out of Food in the Last Year: Not on file  Transportation Needs:   . Lack of  Transportation (Medical): Not on file  . Lack of Transportation (Non-Medical): Not on file  Physical Activity:   . Days of Exercise per Week: Not on file  . Minutes of Exercise per Session: Not on file  Stress:   . Feeling of Stress : Not on file  Social Connections:   . Frequency of Communication with Friends and Family: Not on file  . Frequency of Social Gatherings with Friends and Family: Not on file  . Attends Religious Services: Not on file  . Active Member of Clubs or Organizations: Not on file  . Attends Archivist Meetings: Not on file  . Marital Status: Not on file  Intimate Partner Violence:   . Fear of Current or Ex-Partner: Not on file  . Emotionally Abused: Not on file  . Physically Abused: Not on file  . Sexually Abused: Not on file   Family Status  Relation Name Status  . Mother  Deceased  . Father  Deceased  . Brother  Deceased   Family History  Problem Relation Age of Onset  . Heart disease Mother        no MI  . Peripheral Artery Disease Mother 59  . Bladder Cancer Father 66   No Known Allergies  Patient Care Team:  Ramiyah Mcclenahan, Dionne Bucy, MD as PCP - General (Family Medicine) Yolonda Kida, MD as Consulting Physician (Cardiology)   Medications: Outpatient Medications Prior to Visit  Medication Sig  . atorvastatin (LIPITOR) 40 MG tablet TAKE 1 TABLET BY MOUTH EVERY DAY  . ELIQUIS 5 MG TABS tablet Take 5 mg by mouth 2 (two) times daily.   . metoprolol tartrate (LOPRESSOR) 25 MG tablet Take 25 mg by mouth 2 (two) times daily.   . sacubitril-valsartan (ENTRESTO) 24-26 MG Take 1 tablet by mouth 2 (two) times daily.   . [DISCONTINUED] furosemide (LASIX) 20 MG tablet Take 1 tablet (20 mg total) by mouth daily.   No facility-administered medications prior to visit.    Review of Systems  Constitutional: Positive for appetite change.  Respiratory: Positive for cough, choking and shortness of breath.   Psychiatric/Behavioral: Positive for sleep  disturbance.      Objective    BP 136/76 (BP Location: Left Arm, Patient Position: Sitting, Cuff Size: Normal)   Pulse 60   Temp 97.9 F (36.6 C) (Oral)   Resp 16   Ht 5\' 1"  (1.549 m)   Wt 134 lb (60.8 kg)   SpO2 98%   BMI 25.32 kg/m    Physical Exam   General: Well appearing, well nourished, pleasant conversation in NAD Cards: RRR, S4 appreciated along left sternal border, JVD distension Pulm: Mild crackles heard at both lung bases GI: Non-tender, non-distended Extremities: 2+ Pitting Edema R side, 1+ Edema on Left   Last depression screening scores PHQ 2/9 Scores 09/11/2020 03/31/2019 03/31/2019  PHQ - 2 Score 2 0 0  PHQ- 9 Score 12 1 -   Last fall risk screening Fall Risk  09/11/2020  Falls in the past year? 0  Number falls in past yr: 0  Injury with Fall? 0  Risk for fall due to : No Fall Risks  Follow up Falls evaluation completed   Last Audit-C alcohol use screening Alcohol Use Disorder Test (AUDIT) 09/11/2020  1. How often do you have a drink containing alcohol? 0  2. How many drinks containing alcohol do you have on a typical day when you are drinking? 0  3. How often do you have six or more drinks on one occasion? 0  AUDIT-C Score 0  Alcohol Brief Interventions/Follow-up AUDIT Score <7 follow-up not indicated   A score of 3 or more in women, and 4 or more in men indicates increased risk for alcohol abuse, EXCEPT if all of the points are from question 1   Results for orders placed or performed in visit on 09/11/20  CBC w/Diff/Platelet  Result Value Ref Range   WBC 6.9 3.4 - 10.8 x10E3/uL   RBC 4.27 3.77 - 5.28 x10E6/uL   Hemoglobin 13.4 11.1 - 15.9 g/dL   Hematocrit 39.4 34.0 - 46.6 %   MCV 92 79 - 97 fL   MCH 31.4 26.6 - 33.0 pg   MCHC 34.0 31 - 35 g/dL   RDW 13.1 11.7 - 15.4 %   Platelets 182 150 - 450 x10E3/uL   Neutrophils 72 Not Estab. %   Lymphs 15 Not Estab. %   Monocytes 11 Not Estab. %   Eos 1 Not Estab. %   Basos 1 Not Estab. %    Neutrophils Absolute 5.0 1.40 - 7.00 x10E3/uL   Lymphocytes Absolute 1.0 0 - 3 x10E3/uL   Monocytes Absolute 0.7 0 - 0 x10E3/uL   EOS (ABSOLUTE) 0.1 0.0 - 0.4 x10E3/uL   Basophils  Absolute 0.0 0 - 0 x10E3/uL   Immature Granulocytes 0 Not Estab. %   Immature Grans (Abs) 0.0 0.0 - 0.1 x10E3/uL  Comprehensive metabolic panel  Result Value Ref Range   Glucose 129 (H) 65 - 99 mg/dL   BUN 19 8 - 27 mg/dL   Creatinine, Ser 1.15 (H) 0.57 - 1.00 mg/dL   GFR calc non Af Amer 43 (L) >59 mL/min/1.73   GFR calc Af Amer 49 (L) >59 mL/min/1.73   BUN/Creatinine Ratio 17 12 - 28   Sodium 145 (H) 134 - 144 mmol/L   Potassium 3.4 (L) 3.5 - 5.2 mmol/L   Chloride 107 (H) 96 - 106 mmol/L   CO2 24 20 - 29 mmol/L   Calcium 9.7 8.7 - 10.3 mg/dL   Total Protein 6.0 6.0 - 8.5 g/dL   Albumin 4.1 3.6 - 4.6 g/dL   Globulin, Total 1.9 1.5 - 4.5 g/dL   Albumin/Globulin Ratio 2.2 1.2 - 2.2   Bilirubin Total 2.0 (H) 0.0 - 1.2 mg/dL   Alkaline Phosphatase 131 (H) 44 - 121 IU/L   AST 19 0 - 40 IU/L   ALT 24 0 - 32 IU/L  Lipid panel  Result Value Ref Range   Cholesterol, Total 136 100 - 199 mg/dL   Triglycerides 80 0 - 149 mg/dL   HDL 47 >39 mg/dL   VLDL Cholesterol Cal 16 5 - 40 mg/dL   LDL Chol Calc (NIH) 73 0 - 99 mg/dL   Chol/HDL Ratio 2.9 0.0 - 4.4 ratio  Hemoglobin A1c  Result Value Ref Range   Hgb A1c MFr Bld 6.2 (H) 4.8 - 5.6 %   Est. average glucose Bld gHb Est-mCnc 131 mg/dL    Assessment & Plan    Routine Health Maintenance and Physical Exam  Exercise Activities and Dietary recommendations Goals    . DIET - INCREASE WATER INTAKE     Recommend to start drinking at least 2 to 4 glasses of water a day.        Immunization History  Administered Date(s) Administered  . Fluad Quad(high Dose 65+) 09/02/2019, 09/11/2020  . PFIZER SARS-COV-2 Vaccination 12/02/2019, 12/27/2019, 09/11/2020    Health Maintenance  Topic Date Due  . TETANUS/TDAP  Never done  . DEXA SCAN  Never done  . PNA vac  Low Risk Adult (1 of 2 - PCV13) Never done  . INFLUENZA VACCINE  Completed  . COVID-19 Vaccine  Completed    Discussed health benefits of physical activity, and encouraged her to engage in regular exercise appropriate for her age and condition.    Problem List Items Addressed This Visit      Cardiovascular and Mediastinum   Hypertension    Well Controlled Continue current medications Check CMP F/U 6 Months      Relevant Medications   furosemide (LASIX) 40 MG tablet   Other Relevant Orders   Comprehensive metabolic panel (Completed)   Chronic systolic (congestive) heart failure (HCC)    Appears to be slightly hypervolemic at this visit Increase Lasix to 40 mg / day following with cardiology Dr. Clayborn Bigness in Jan Continue Metoprolol and Entresto Recheck CMP      Relevant Medications   furosemide (LASIX) 40 MG tablet   Other Relevant Orders   Comprehensive metabolic panel (Completed)   Renal Function Panel   Paroxysmal atrial fibrillation (HCC)    Stable, well controlled Continue Eliquis and Metoprolol Following with cardiology Check CBC      Relevant Medications  furosemide (LASIX) 40 MG tablet   Other Relevant Orders   CBC w/Diff/Platelet (Completed)   Hypertensive heart disease with heart failure ([place])    Plan as above      Relevant Medications   furosemide (LASIX) 40 MG tablet   Other Relevant Orders   Comprehensive metabolic panel (Completed)     Other   Mixed hyperlipidemia    Continues to be well controlled  Continue statin Check LFTs and FLP      Relevant Medications   furosemide (LASIX) 40 MG tablet   Other Relevant Orders   Comprehensive metabolic panel (Completed)   Lipid panel (Completed)   Prediabetes    Encourage low-carb diet Recheck A1c      Relevant Orders   Hemoglobin A1c (Completed)   Encounter for annual physical exam   Relevant Orders   CBC w/Diff/Platelet (Completed)   Comprehensive metabolic panel (Completed)   Lipid  panel (Completed)   Hemoglobin A1c (Completed)   Peripheral edema    2+ R side, 1+ L Side Likely 2/2 fluid overload from CHF Continue to Diurese      Relevant Medications   furosemide (LASIX) 40 MG tablet    Other Visit Diagnoses    Encounter for annual wellness visit (AWV) in Medicare patient    -  Primary   Encounter for current long-term use of anticoagulants       Relevant Orders   CBC w/Diff/Platelet (Completed)   Need for influenza vaccination       Relevant Orders   Flu Vaccine QUAD High Dose(Fluad) (Completed)   Encounter for immunization       Relevant Orders   Pfizer SARS-COV-2 Vaccine (Completed)      Return in about 6 months (around 03/11/2021) for chronic disease f/u.     Patient seen along with MS3 student [facility]. I personally evaluated this patient along with the student, and verified all aspects of the history, physical exam, and medical decision making as documented by the student. I agree with the student's documentation and have made all necessary edits.  Kamoni Gentles, Dionne Bucy, MD, MPH [facility]

## 2020-09-11 NOTE — Assessment & Plan Note (Addendum)
Stable, well controlled Continue Eliquis and Metoprolol Following with cardiology Check CBC

## 2020-09-11 NOTE — Patient Instructions (Signed)
Preventive Care 84 Years and Older, Female Preventive care refers to lifestyle choices and visits with your health care provider that can promote health and wellness. This includes:  A yearly physical exam. This is also called an annual well check.  Regular dental and eye exams.  Immunizations.  Screening for certain conditions.  Healthy lifestyle choices, such as diet and exercise. What can I expect for my preventive care visit? Physical exam Your health care provider will check:  Height and weight. These may be used to calculate body mass index (BMI), which is a measurement that tells if you are at a healthy weight.  Heart rate and blood pressure.  Your skin for abnormal spots. Counseling Your health care provider may ask you questions about:  Alcohol, tobacco, and drug use.  Emotional well-being.  Home and relationship well-being.  Sexual activity.  Eating habits.  History of falls.  Memory and ability to understand (cognition).  Work and work Statistician.  Pregnancy and menstrual history. What immunizations do I need?  Influenza (flu) vaccine  This is recommended every year. Tetanus, diphtheria, and pertussis (Tdap) vaccine  You may need a Td booster every 10 years. Varicella (chickenpox) vaccine  You may need this vaccine if you have not already been vaccinated. Zoster (shingles) vaccine  You may need this after age 33. Pneumococcal conjugate (PCV13) vaccine  One dose is recommended after age 33. Pneumococcal polysaccharide (PPSV23) vaccine  One dose is recommended after age 72. Measles, mumps, and rubella (MMR) vaccine  You may need at least one dose of MMR if you were born in 1957 or later. You may also need a second dose. Meningococcal conjugate (MenACWY) vaccine  You may need this if you have certain conditions. Hepatitis A vaccine  You may need this if you have certain conditions or if you travel or work in places where you may be exposed  to hepatitis A. Hepatitis B vaccine  You may need this if you have certain conditions or if you travel or work in places where you may be exposed to hepatitis B. Haemophilus influenzae type b (Hib) vaccine  You may need this if you have certain conditions. You may receive vaccines as individual doses or as more than one vaccine together in one shot (combination vaccines). Talk with your health care provider about the risks and benefits of combination vaccines. What tests do I need? Blood tests  Lipid and cholesterol levels. These may be checked every 5 years, or more frequently depending on your overall health.  Hepatitis C test.  Hepatitis B test. Screening  Lung cancer screening. You may have this screening every year starting at age 39 if you have a 30-pack-year history of smoking and currently smoke or have quit within the past 15 years.  Colorectal cancer screening. All adults should have this screening starting at age 36 and continuing until age 15. Your health care provider may recommend screening at age 23 if you are at increased risk. You will have tests every 1-10 years, depending on your results and the type of screening test.  Diabetes screening. This is done by checking your blood sugar (glucose) after you have not eaten for a while (fasting). You may have this done every 1-3 years.  Mammogram. This may be done every 1-2 years. Talk with your health care provider about how often you should have regular mammograms.  BRCA-related cancer screening. This may be done if you have a family history of breast, ovarian, tubal, or peritoneal cancers.  Other tests  Sexually transmitted disease (STD) testing.  Bone density scan. This is done to screen for osteoporosis. You may have this done starting at age 65. Follow these instructions at home: Eating and drinking  Eat a diet that includes fresh fruits and vegetables, whole grains, lean protein, and low-fat dairy products. Limit  your intake of foods with high amounts of sugar, saturated fats, and salt.  Take vitamin and mineral supplements as recommended by your health care provider.  Do not drink alcohol if your health care provider tells you not to drink.  If you drink alcohol: ? Limit how much you have to 0-1 drink a day. ? Be aware of how much alcohol is in your drink. In the [place] one drink equals one 12 oz bottle of beer (355 mL), one 5 oz glass of wine (148 mL), or one 1 oz glass of hard liquor (44 mL). Lifestyle  Take daily care of your teeth and gums.  Stay active. Exercise for at least 30 minutes on 5 or more days each week.  Do not use any products that contain nicotine or tobacco, such as cigarettes, e-cigarettes, and chewing tobacco. If you need help quitting, ask your health care provider.  If you are sexually active, practice safe sex. Use a condom or other form of protection in order to prevent STIs (sexually transmitted infections).  Talk with your health care provider about taking a low-dose aspirin or statin. What's next?  Go to your health care provider once a year for a well check visit.  Ask your health care provider how often you should have your eyes and teeth checked.  Stay up to date on all vaccines. This information is not intended to replace advice given to you by your health care provider. Make sure you discuss any questions you have with your health care provider. Document Revised: 10/15/2018 Document Reviewed: 10/15/2018 Elsevier Patient Education  2020 Elsevier Inc.  

## 2020-09-11 NOTE — Assessment & Plan Note (Signed)
Continues to be well controlled  Continue statin Check LFTs and FLP

## 2020-09-11 NOTE — Assessment & Plan Note (Signed)
Well Controlled Continue current medications Check CMP F/U 6 Months

## 2020-09-11 NOTE — Assessment & Plan Note (Signed)
Encourage low carb diet Recheck A1c 

## 2020-09-11 NOTE — Assessment & Plan Note (Signed)
Plan as above.  

## 2020-09-11 NOTE — Assessment & Plan Note (Signed)
Appears to be slightly hypervolemic at this visit Increase Lasix to 40 mg / day following with cardiology Dr. Clayborn Bigness in Jan Continue Metoprolol and Entresto Recheck CMP

## 2020-09-11 NOTE — Assessment & Plan Note (Signed)
2+ R side, 1+ L Side Likely 2/2 fluid overload from CHF Continue to Diurese

## 2020-09-12 ENCOUNTER — Telehealth: Payer: Self-pay

## 2020-09-12 DIAGNOSIS — E876 Hypokalemia: Secondary | ICD-10-CM

## 2020-09-12 LAB — CBC WITH DIFFERENTIAL/PLATELET
Basophils Absolute: 0 10*3/uL (ref 0.0–0.2)
Basos: 1 %
EOS (ABSOLUTE): 0.1 10*3/uL (ref 0.0–0.4)
Eos: 1 %
Hematocrit: 39.4 % (ref 34.0–46.6)
Hemoglobin: 13.4 g/dL (ref 11.1–15.9)
Immature Grans (Abs): 0 10*3/uL (ref 0.0–0.1)
Immature Granulocytes: 0 %
Lymphocytes Absolute: 1 10*3/uL (ref 0.7–3.1)
Lymphs: 15 %
MCH: 31.4 pg (ref 26.6–33.0)
MCHC: 34 g/dL (ref 31.5–35.7)
MCV: 92 fL (ref 79–97)
Monocytes Absolute: 0.7 10*3/uL (ref 0.1–0.9)
Monocytes: 11 %
Neutrophils Absolute: 5 10*3/uL (ref 1.4–7.0)
Neutrophils: 72 %
Platelets: 182 10*3/uL (ref 150–450)
RBC: 4.27 x10E6/uL (ref 3.77–5.28)
RDW: 13.1 % (ref 11.7–15.4)
WBC: 6.9 10*3/uL (ref 3.4–10.8)

## 2020-09-12 LAB — COMPREHENSIVE METABOLIC PANEL
ALT: 24 IU/L (ref 0–32)
AST: 19 IU/L (ref 0–40)
Albumin/Globulin Ratio: 2.2 (ref 1.2–2.2)
Albumin: 4.1 g/dL (ref 3.6–4.6)
Alkaline Phosphatase: 131 IU/L — ABNORMAL HIGH (ref 44–121)
BUN/Creatinine Ratio: 17 (ref 12–28)
BUN: 19 mg/dL (ref 8–27)
Bilirubin Total: 2 mg/dL — ABNORMAL HIGH (ref 0.0–1.2)
CO2: 24 mmol/L (ref 20–29)
Calcium: 9.7 mg/dL (ref 8.7–10.3)
Chloride: 107 mmol/L — ABNORMAL HIGH (ref 96–106)
Creatinine, Ser: 1.15 mg/dL — ABNORMAL HIGH (ref 0.57–1.00)
GFR calc Af Amer: 49 mL/min/{1.73_m2} — ABNORMAL LOW (ref >59)
GFR calc non Af Amer: 43 mL/min/{1.73_m2} — ABNORMAL LOW (ref >59)
Globulin, Total: 1.9 g/dL (ref 1.5–4.5)
Glucose: 129 mg/dL — ABNORMAL HIGH (ref 65–99)
Potassium: 3.4 mmol/L — ABNORMAL LOW (ref 3.5–5.2)
Sodium: 145 mmol/L — ABNORMAL HIGH (ref 134–144)
Total Protein: 6 g/dL (ref 6.0–8.5)

## 2020-09-12 LAB — HEMOGLOBIN A1C
Est. average glucose Bld gHb Est-mCnc: 131 mg/dL
Hgb A1c MFr Bld: 6.2 % — ABNORMAL HIGH (ref 4.8–5.6)

## 2020-09-12 LAB — LIPID PANEL
Chol/HDL Ratio: 2.9 ratio (ref 0.0–4.4)
Cholesterol, Total: 136 mg/dL (ref 100–199)
HDL: 47 mg/dL
LDL Chol Calc (NIH): 73 mg/dL (ref 0–99)
Triglycerides: 80 mg/dL (ref 0–149)
VLDL Cholesterol Cal: 16 mg/dL (ref 5–40)

## 2020-09-12 MED ORDER — POTASSIUM CHLORIDE CRYS ER 20 MEQ PO TBCR: 20.0000 meq | EXTENDED_RELEASE_TABLET | Freq: Every day | ORAL | 3 refills | Status: DC

## 2020-09-12 NOTE — Telephone Encounter (Signed)
Advised patient of results. Medication was sent into the pharmacy. Patient is aware to have labs repeated.

## 2020-09-12 NOTE — Telephone Encounter (Signed)
-----   Message from Virginia Crews, MD sent at 09/12/2020  9:52 AM EST ----- Normal/stable labs, except low potassium.  Start KDur 20 mEq daily with lasix.  Ok to send eRx.  Make sure to repeat labs as discussed (slip already given) in 2 weeks.

## 2020-09-15 ENCOUNTER — Telehealth: Payer: Self-pay

## 2020-09-15 NOTE — Telephone Encounter (Signed)
Copied from [place] [id number]. Topic: Appointment Scheduling - Scheduling Inquiry for Clinic >> Sep 14, 2020  3:20 PM Erick Blinks wrote: Reason for CRM: Pt is requesting to reschedule AWV. States that she was not told that it was cancelled and has been waiting for her call for 40 minutes. Please advise   Best contact: [phone]

## 2020-10-03 LAB — RENAL FUNCTION PANEL
Albumin: 4.5 g/dL (ref 3.6–4.6)
BUN/Creatinine Ratio: 16 (ref 12–28)
BUN: 18 mg/dL (ref 8–27)
CO2: 21 mmol/L (ref 20–29)
Calcium: 10 mg/dL (ref 8.7–10.3)
Chloride: 101 mmol/L (ref 96–106)
Creatinine, Ser: 1.14 mg/dL — ABNORMAL HIGH (ref 0.57–1.00)
GFR calc Af Amer: 50 mL/min/{1.73_m2} — ABNORMAL LOW (ref >59)
GFR calc non Af Amer: 43 mL/min/{1.73_m2} — ABNORMAL LOW (ref >59)
Glucose: 115 mg/dL — ABNORMAL HIGH (ref 65–99)
Phosphorus: 3.3 mg/dL (ref 3.0–4.3)
Potassium: 3.8 mmol/L (ref 3.5–5.2)
Sodium: 140 mmol/L (ref 134–144)

## 2020-10-04 ENCOUNTER — Telehealth: Payer: Self-pay

## 2020-10-04 NOTE — Telephone Encounter (Signed)
-----   Message from Virginia Crews, MD sent at 10/03/2020 10:31 AM EST ----- Normal labs

## 2020-10-04 NOTE — Telephone Encounter (Signed)
Pt advised.   Thanks,   -Arik Husmann  

## 2020-11-30 ENCOUNTER — Other Ambulatory Visit
Admission: RE | Admit: 2020-11-30 | Discharge: 2020-11-30 | Disposition: A | Discharge status: Home / Self Care | Payer: Medicare Other | Source: Ambulatory Visit | Attending: Student | Admitting: Student

## 2020-11-30 DIAGNOSIS — I5020 Unspecified systolic (congestive) heart failure: Secondary | ICD-10-CM | POA: Insufficient documentation

## 2020-11-30 LAB — BRAIN NATRIURETIC PEPTIDE: B Natriuretic Peptide: 3821.4 pg/mL — ABNORMAL HIGH (ref 0.0–100.0)

## 2020-12-04 ENCOUNTER — Other Ambulatory Visit: Payer: Self-pay | Admitting: Family Medicine

## 2020-12-04 DIAGNOSIS — R609 Edema, unspecified: Secondary | ICD-10-CM

## 2021-03-12 ENCOUNTER — Ambulatory Visit: Payer: Self-pay | Admitting: Family Medicine

## 2021-05-14 ENCOUNTER — Ambulatory Visit: Payer: Self-pay | Admitting: Family Medicine

## 2021-06-04 ENCOUNTER — Encounter: Payer: Self-pay | Admitting: Family Medicine

## 2021-06-04 ENCOUNTER — Ambulatory Visit (INDEPENDENT_AMBULATORY_CARE_PROVIDER_SITE_OTHER): Payer: Medicare Other | Admitting: Family Medicine

## 2021-06-04 ENCOUNTER — Other Ambulatory Visit: Payer: Self-pay

## 2021-06-04 VITALS — BP 119/88 | HR 83 | Temp 98.2°F | Wt 121.0 lb

## 2021-06-04 DIAGNOSIS — I10 Essential (primary) hypertension: Secondary | ICD-10-CM | POA: Diagnosis not present

## 2021-06-04 DIAGNOSIS — I4891 Unspecified atrial fibrillation: Secondary | ICD-10-CM | POA: Diagnosis not present

## 2021-06-04 DIAGNOSIS — I5022 Chronic systolic (congestive) heart failure: Secondary | ICD-10-CM | POA: Diagnosis not present

## 2021-06-04 DIAGNOSIS — E782 Mixed hyperlipidemia: Secondary | ICD-10-CM

## 2021-06-04 DIAGNOSIS — B351 Tinea unguium: Secondary | ICD-10-CM

## 2021-06-04 NOTE — Progress Notes (Signed)
Established patient visit   Patient: Marissa Gilmore   DOB: 1931/03/12   87 y.o. Female  MRN: [id number] Visit Date: 06/04/2021  Today's healthcare provider: Vernie Murders, PA-C   No chief complaint on file.  Subjective    HPI  Hypertension, follow-up  BP Readings from Last 3 Encounters:  06/04/21 119/88  09/11/20 136/76  03/09/20 122/72   Wt Readings from Last 3 Encounters:  06/04/21 121 lb (54.9 kg)  09/11/20 134 lb (60.8 kg)  03/09/20 130 lb (59 kg)     She was last seen for hypertension 6 months ago.  BP at that visit was as above. Management since that visit includes none.  She reports good compliance with treatment. She is not having side effects.   Use of agents associated with hypertension: none.   Outside blood pressures are checked and good but she does not know the actual readings. Symptoms: No chest pain No chest pressure  No palpitations No syncope  No dyspnea No orthopnea  No paroxysmal nocturnal dyspnea No lower extremity edema   Pertinent labs: Lab Results  Component Value Date   CHOL 136 09/11/2020   HDL 47 09/11/2020   LDLCALC 73 09/11/2020   TRIG 80 09/11/2020   CHOLHDL 2.9 09/11/2020   Lab Results  Component Value Date   NA 140 10/02/2020   K 3.8 10/02/2020   CREATININE 1.14 (H) 10/02/2020   GFRNONAA 43 (L) 10/02/2020   GFRAA 50 (L) 10/02/2020   GLUCOSE 115 (H) 10/02/2020     The ASCVD Risk score Mikey Bussing DC Jr., et al., 2013) failed to calculate for the following reasons:   The 2013 ASCVD risk score is only valid for ages 37 to 46   --------------------------------------------------------------------------------------------------- Past Medical History:  Diagnosis Date   Anxiety    Cataract    Hypertension     Past Surgical History:  Procedure Laterality Date   CATARACT EXTRACTION Right    Family History  Problem Relation Age of Onset   Heart disease Mother        no MI   Peripheral Artery Disease Mother 41    Bladder Cancer Father 56   Social History   Tobacco Use   Smoking status: Never   Smokeless tobacco: Never  Vaping Use   Vaping Use: Never used  Substance Use Topics   Alcohol use: No   Drug use: No   No Known Allergies  Medications: Outpatient Medications Prior to Visit  Medication Sig   atorvastatin (LIPITOR) 40 MG tablet TAKE 1 TABLET BY MOUTH EVERY DAY   ELIQUIS 5 MG TABS tablet Take 5 mg by mouth 2 (two) times daily.    furosemide (LASIX) 40 MG tablet TAKE 1 TABLET BY MOUTH EVERY DAY   potassium chloride SA (KLOR-CON) 20 MEQ tablet Take 1 tablet (20 mEq total) by mouth daily.   sacubitril-valsartan (ENTRESTO) 24-26 MG Take 1 tablet by mouth 2 (two) times daily.    spironolactone (ALDACTONE) 25 MG tablet Take 12.5 mg by mouth daily.   metoprolol tartrate (LOPRESSOR) 25 MG tablet Take 25 mg by mouth 2 (two) times daily.    No facility-administered medications prior to visit.    Review of Systems  Constitutional: Negative.   HENT: Negative.    Respiratory: Negative.    Cardiovascular: Negative.   All other systems reviewed and are negative.  {Labs  Heme  Chem  Endocrine  Serology  Results Review (optional):[id number]}   Objective    BP  119/88 (BP Location: Right Arm, Patient Position: Sitting, Cuff Size: Normal)   Pulse 83   Temp 98.2 F (36.8 C) (Oral)   Wt 121 lb (54.9 kg)   SpO2 97%   BMI 22.86 kg/m  BP Readings from Last 3 Encounters:  06/04/21 119/88  09/11/20 136/76  03/09/20 122/72   Wt Readings from Last 3 Encounters:  06/04/21 121 lb (54.9 kg)  09/11/20 134 lb (60.8 kg)  03/09/20 130 lb (59 kg)   Physical Exam Constitutional:      General: She is not in acute distress.    Appearance: She is well-developed.  HENT:     Head: Normocephalic and atraumatic.     Right Ear: Hearing normal.     Left Ear: Hearing normal.     Nose: Nose normal.  Eyes:     General: Lids are normal. No scleral icterus.       Right eye: No discharge.        Left eye:  No discharge.     Conjunctiva/sclera: Conjunctivae normal.  Cardiovascular:     Rate and Rhythm: Normal rate and regular rhythm.     Heart sounds: Normal heart sounds.  Pulmonary:     Effort: Pulmonary effort is normal. No respiratory distress.     Breath sounds: Normal breath sounds.  Abdominal:     General: Bowel sounds are normal.     Palpations: Abdomen is soft.  Musculoskeletal:        General: Normal range of motion.     Comments: Chronic peripheral edema. Only mild puffiness today.  Skin:    Findings: No lesion or rash.  Neurological:     Mental Status: She is alert and oriented to person, place, and time.  Psychiatric:        Speech: Speech normal.        Behavior: Behavior normal.        Thought Content: Thought content normal.     No results found for any visits on 06/04/21.  Assessment & Plan     1. Atrial fibrillation with RVR (HCC) Presently on Metoprolol for rate control and Eliquis for anticoagulation. Recheck labs. Followed by Dr. Clayborn Bigness (cardiologist). No chest pain or significant dyspnea today. - CBC with Differential/Platelet - Comprehensive metabolic panel - TSH  2. Chronic systolic (congestive) heart failure (HCC) History of cardiomyopathy with EF 30%. Cardiologist evaluation on 05-21-21 recommended she continue Entresto, Spironolactone and Metoprolol  with diuretic prn peripheral edema. Patient states he wanted her to keep the Torsemide 20 mg qd with KCL 20 meq qd, also. Recheck labs. - CBC with Differential/Platelet - Comprehensive metabolic panel  3. Essential hypertension Good control with present medication regimen. No syncope or dizziness. - CBC with Differential/Platelet - Comprehensive metabolic panel  4. Mixed hyperlipidemia Tolerating the Atorvastatin 40 mg qd. Continue low fat diet and recheck labs. - Comprehensive metabolic panel - Lipid panel - TSH   No follow-ups on file.      I, Herny Scurlock, PA-C, have reviewed all  documentation for this visit. The documentation on 06/04/21 for the exam, diagnosis, procedures, and orders are all accurate and complete.    Vernie Murders, PA-C  [employer] [phone] (phone) [phone] (fax)  [place]

## 2021-06-05 LAB — CBC WITH DIFFERENTIAL/PLATELET
Basophils Absolute: 0.1 10*3/uL (ref 0.0–0.2)
Basos: 1 %
EOS (ABSOLUTE): 0.1 10*3/uL (ref 0.0–0.4)
Eos: 1 %
Hematocrit: 42 % (ref 34.0–46.6)
Hemoglobin: 13.5 g/dL (ref 11.1–15.9)
Immature Grans (Abs): 0 10*3/uL (ref 0.0–0.1)
Immature Granulocytes: 0 %
Lymphocytes Absolute: 1.1 10*3/uL (ref 0.7–3.1)
Lymphs: 16 %
MCH: 29.7 pg (ref 26.6–33.0)
MCHC: 32.1 g/dL (ref 31.5–35.7)
MCV: 92 fL (ref 79–97)
Monocytes Absolute: 0.8 10*3/uL (ref 0.1–0.9)
Monocytes: 11 %
Neutrophils Absolute: 5 10*3/uL (ref 1.4–7.0)
Neutrophils: 71 %
Platelets: 163 10*3/uL (ref 150–450)
RBC: 4.55 x10E6/uL (ref 3.77–5.28)
RDW: 12.7 % (ref 11.7–15.4)
WBC: 7.1 10*3/uL (ref 3.4–10.8)

## 2021-06-05 LAB — COMPREHENSIVE METABOLIC PANEL WITH GFR
ALT: 28 [IU]/L (ref 0–32)
AST: 29 [IU]/L (ref 0–40)
Albumin/Globulin Ratio: 1.8 (ref 1.2–2.2)
Albumin: 4.2 g/dL (ref 3.6–4.6)
Alkaline Phosphatase: 132 [IU]/L — ABNORMAL HIGH (ref 44–121)
BUN/Creatinine Ratio: 25 (ref 12–28)
BUN: 33 mg/dL — ABNORMAL HIGH (ref 8–27)
Bilirubin Total: 0.5 mg/dL (ref 0.0–1.2)
CO2: 23 mmol/L (ref 20–29)
Calcium: 10.3 mg/dL (ref 8.7–10.3)
Chloride: 104 mmol/L (ref 96–106)
Creatinine, Ser: 1.34 mg/dL — ABNORMAL HIGH (ref 0.57–1.00)
Globulin, Total: 2.3 g/dL (ref 1.5–4.5)
Glucose: 110 mg/dL — ABNORMAL HIGH (ref 65–99)
Potassium: 5 mmol/L (ref 3.5–5.2)
Sodium: 143 mmol/L (ref 134–144)
Total Protein: 6.5 g/dL (ref 6.0–8.5)
eGFR: 38 mL/min/{1.73_m2} — ABNORMAL LOW

## 2021-06-05 LAB — LIPID PANEL
Chol/HDL Ratio: 2.8 ratio (ref 0.0–4.4)
Cholesterol, Total: 188 mg/dL (ref 100–199)
HDL: 68 mg/dL
LDL Chol Calc (NIH): 100 mg/dL — ABNORMAL HIGH (ref 0–99)
Triglycerides: 112 mg/dL (ref 0–149)
VLDL Cholesterol Cal: 20 mg/dL (ref 5–40)

## 2021-06-05 LAB — TSH: TSH: 0.889 u[IU]/mL (ref 0.450–4.500)

## 2021-06-18 ENCOUNTER — Other Ambulatory Visit: Payer: Self-pay

## 2021-06-18 ENCOUNTER — Encounter: Payer: Self-pay | Admitting: Podiatry

## 2021-06-18 ENCOUNTER — Ambulatory Visit (INDEPENDENT_AMBULATORY_CARE_PROVIDER_SITE_OTHER): Payer: Medicare Other | Admitting: Podiatry

## 2021-06-18 DIAGNOSIS — B351 Tinea unguium: Secondary | ICD-10-CM

## 2021-06-18 DIAGNOSIS — D2372 Other benign neoplasm of skin of left lower limb, including hip: Secondary | ICD-10-CM | POA: Diagnosis not present

## 2021-06-18 DIAGNOSIS — M79676 Pain in unspecified toe(s): Secondary | ICD-10-CM | POA: Diagnosis not present

## 2021-06-18 DIAGNOSIS — D2371 Other benign neoplasm of skin of right lower limb, including hip: Secondary | ICD-10-CM | POA: Diagnosis not present

## 2021-06-18 DIAGNOSIS — D689 Coagulation defect, unspecified: Secondary | ICD-10-CM | POA: Diagnosis not present

## 2021-06-18 NOTE — Progress Notes (Signed)
  Subjective:  Patient ID: Marissa Gilmore, female    DOB: [birth year],  MRN: [id number] HPI Chief Complaint  Patient presents with   Debridement    Trim toenails and calluses - long, thick, discolored nails, had been retaining fluid and was unable to trim nails herself, callus sub/medial 1st MPJ right   New Patient (Initial Visit)    87 y.o. female presents with the above complaint.   ROS: Denies fever chills nausea vomiting muscle aches pains calf pain back pain chest pain shortness of breath.  Past Medical History:  Diagnosis Date   Anxiety    Cataract    Hypertension    Past Surgical History:  Procedure Laterality Date   CATARACT EXTRACTION Right     Current Outpatient Medications:    atorvastatin (LIPITOR) 40 MG tablet, TAKE 1 TABLET BY MOUTH EVERY DAY, Disp: 90 tablet, Rfl: 3   ELIQUIS 5 MG TABS tablet, Take 5 mg by mouth 2 (two) times daily. , Disp: , Rfl:    furosemide (LASIX) 40 MG tablet, TAKE 1 TABLET BY MOUTH EVERY DAY, Disp: 90 tablet, Rfl: 1   metoprolol tartrate (LOPRESSOR) 25 MG tablet, Take 25 mg by mouth 2 (two) times daily. , Disp: , Rfl:    potassium chloride (KLOR-CON) 20 MEQ packet, Take 20 mEq by mouth daily., Disp: , Rfl:    sacubitril-valsartan (ENTRESTO) 24-26 MG, Take 1 tablet by mouth 2 (two) times daily. , Disp: , Rfl:    spironolactone (ALDACTONE) 25 MG tablet, Take 12.5 mg by mouth daily., Disp: , Rfl:    torsemide (DEMADEX) 20 MG tablet, Take 20 mg by mouth 2 (two) times daily., Disp: , Rfl:   No Known Allergies Review of Systems Objective:  There were no vitals filed for this visit.  General: Well developed, nourished, in no acute distress, alert and oriented x3   Dermatological: Skin is warm, dry and supple bilateral. Nails x 10 are w thick yellow dystrophic poorly maintained remaining integument appears unremarkable at this time. There are no open sores, no preulcerative lesions, no rash or signs of infection present.  Vascular: Dorsalis  Pedis artery and Posterior Tibial artery pedal pulses are 2/4 bilateral with immedate capillary fill time. Pedal hair growth present. No varicosities and no lower extremity edema present bilateral.   Neruologic: Grossly intact via light touch bilateral. Vibratory intact via tuning fork bilateral. Protective threshold with Semmes Wienstein monofilament intact to all pedal sites bilateral. Patellar and Achilles deep tendon reflexes 2+ bilateral. No Babinski or clonus noted bilateral.   Musculoskeletal: No gross boney pedal deformities bilateral. No pain, crepitus, or limitation noted with foot and ankle range of motion bilateral. Muscular strength 5/5 in all groups tested bilateral.  Gait: Unassisted, Nonantalgic.    Radiographs:  None taken  Assessment & Plan:   Assessment: Pain in limb secondary to onychomycosis  Plan: Debridement of toenails 1 through 5 bilateral.     Marissa Gilmore, [place]

## 2021-07-26 ENCOUNTER — Other Ambulatory Visit: Payer: Self-pay | Admitting: Family Medicine

## 2021-09-20 ENCOUNTER — Ambulatory Visit (INDEPENDENT_AMBULATORY_CARE_PROVIDER_SITE_OTHER): Payer: Medicare Other | Admitting: Podiatry

## 2021-09-20 ENCOUNTER — Other Ambulatory Visit: Payer: Self-pay

## 2021-09-20 ENCOUNTER — Encounter (INDEPENDENT_AMBULATORY_CARE_PROVIDER_SITE_OTHER): Payer: Self-pay

## 2021-09-20 ENCOUNTER — Encounter: Payer: Self-pay | Admitting: Podiatry

## 2021-09-20 DIAGNOSIS — M79676 Pain in unspecified toe(s): Secondary | ICD-10-CM | POA: Diagnosis not present

## 2021-09-20 DIAGNOSIS — D689 Coagulation defect, unspecified: Secondary | ICD-10-CM | POA: Diagnosis not present

## 2021-09-20 DIAGNOSIS — B351 Tinea unguium: Secondary | ICD-10-CM | POA: Diagnosis not present

## 2021-09-20 NOTE — Progress Notes (Signed)
This patient returns to my office for at risk foot care.  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  Patient is taking eliquis.  This patient is unable to cut nails herself since the patient cannot reach her nails.These nails are painful walking and wearing shoes.  This patient presents for at risk foot care today.  General Appearance  Alert, conversant and in no acute stress.  Vascular  Dorsalis pedis and posterior tibial  pulses are palpable  bilaterally.  Capillary return is within normal limits  bilaterally. Temperature is within normal limits  bilaterally.  Neurologic  Senn-Weinstein monofilament wire test within normal limits  bilaterally. Muscle power within normal limits bilaterally.  Nails Thick disfigured discolored nails with subungual debris  from hallux to fifth toes bilaterally. No evidence of bacterial infection or drainage bilaterally.  Orthopedic  No limitations of motion  feet .  No crepitus or effusions noted.  No bony pathology or digital deformities noted.  Severe HAV  B/L.  Skin  normotropic skin with no porokeratosis noted bilaterally.  No signs of infections or ulcers noted.     Onychomycosis  Pain in right toes  Pain in left toes  Consent was obtained for treatment procedures.   Mechanical debridement of nails 1-5  bilaterally performed with a nail nipper.  Filed with dremel without incident. Patient does not know why she was reappointed.  Therefore she was told to return  prn.   Return office visit  prn                   Told patient to return for periodic foot care and evaluation due to potential at risk complications.   Gardiner Barefoot DPM

## 2022-08-04 ENCOUNTER — Other Ambulatory Visit: Payer: Self-pay | Admitting: Family Medicine

## 2022-09-11 ENCOUNTER — Telehealth: Payer: Self-pay | Admitting: *Deleted

## 2022-09-11 NOTE — Telephone Encounter (Signed)
Copied from [place] [id number]. Topic: Appointment Scheduling - Scheduling Inquiry for Clinic >> Sep 11, 2022 10:34 AM Sabas Sous wrote: Reason for CRM: Pt would like to have a phone call appt instead of coming for AWV. Please advise  Contact Information  [phone]

## 2022-09-11 NOTE — Telephone Encounter (Signed)
Noted  

## 2022-09-16 ENCOUNTER — Ambulatory Visit (INDEPENDENT_AMBULATORY_CARE_PROVIDER_SITE_OTHER): Payer: Medicare Other

## 2022-09-16 VITALS — Ht 64.0 in | Wt 121.0 lb

## 2022-09-16 DIAGNOSIS — Z0000 Encounter for general adult medical examination without abnormal findings: Secondary | ICD-10-CM

## 2022-09-16 NOTE — Progress Notes (Signed)
Virtual Visit via Telephone Note  I connected with  Marissa Gilmore on 09/16/22 at  1:00 PM EST by telephone and verified that I am speaking with the correct person using two identifiers.  Location: Patient: home Provider: [facility] Persons participating in the virtual visit: [place]   I discussed the limitations, risks, security and privacy concerns of performing an evaluation and management service by telephone and the availability of in person appointments. The patient expressed understanding and agreed to proceed.  Interactive audio and video telecommunications were attempted between this nurse and patient, however failed, due to patient having technical difficulties OR patient did not have access to video capability.  We continued and completed visit with audio only.  Some vital signs may be absent or patient reported.   Dionisio David, LPN  Subjective:   Marissa Gilmore is a 88 y.o. female who presents for Medicare Annual (Subsequent) preventive examination.  Review of Systems     Cardiac Risk Factors include: advanced age (>7mn, >>8women);hypertension     Objective:    Today's Vitals   09/16/22 1317  Weight: 121 lb (54.9 kg)  Height: '5\' 4"'$  (1.626 m)   Body mass index is 20.77 kg/m.     09/16/2022    1:08 PM 03/31/2019   10:25 AM 03/25/2018   10:49 AM 07/21/2017    4:27 PM  Advanced Directives  Does Patient Have a Medical Advance Directive? No No No No  Would patient like information on creating a medical advance directive? No - Patient declined No - Patient declined No - Patient declined     Current Medications (verified) Outpatient Encounter Medications as of 09/16/2022  Medication Sig   atorvastatin (LIPITOR) 40 MG tablet Take 1 tablet (40 mg total) by mouth daily. Please schedule office visit before any future refill.   ELIQUIS 2.5 MG TABS tablet SMARTSIG:1 Tablet(s) By Mouth Every 12 Hours   ELIQUIS 5 MG TABS tablet Take 5 mg by  mouth 2 (two) times daily.    furosemide (LASIX) 40 MG tablet TAKE 1 TABLET BY MOUTH EVERY DAY   potassium chloride (KLOR-CON) 20 MEQ packet Take 20 mEq by mouth daily.   sacubitril-valsartan (ENTRESTO) 24-26 MG Take 1 tablet by mouth 2 (two) times daily.    spironolactone (ALDACTONE) 25 MG tablet Take 12.5 mg by mouth daily.   torsemide (DEMADEX) 20 MG tablet Take 20 mg by mouth 2 (two) times daily.   metoprolol tartrate (LOPRESSOR) 25 MG tablet Take 25 mg by mouth 2 (two) times daily.    No facility-administered encounter medications on file as of 09/16/2022.    Allergies (verified) Patient has no known allergies.   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 Tobacco Use   Smoking status: Never   Smokeless tobacco: Never  Vaping Use   Vaping Use: Never used  Substance and Sexual Activity   Alcohol use: No   Drug use: No   Sexual activity: Not on file  Other Topics Concern   Not on file  Social History Narrative   Not on file   Social Determinants of Health   Financial Resource Strain: Low Risk  (09/16/2022)   Overall Financial Resource Strain (CARDIA)    Difficulty of Paying Living Expenses: Not hard at all  Food Insecurity: No Food Insecurity (09/16/2022)   Hunger Vital Sign    Worried About Running Out of Food in the Last Year: Never true    Ran Out of Food in the Last Year: Never true  Transportation Needs: No  Transportation Needs (09/16/2022)   PRAPARE - Hydrologist (Medical): No    Lack of Transportation (Non-Medical): No  Physical Activity: Insufficiently Active (09/16/2022)   Exercise Vital Sign    Days of Exercise per Week: 3 days    Minutes of Exercise per Session: 30 min  Stress: No Stress Concern Present (09/16/2022)   [place]    Feeling of Stress : Not at all  Social Connections: Moderately Isolated (09/16/2022)   Social Connection and Isolation Panel [NHANES]    Frequency of Communication with Friends and Family: Twice a week    Frequency of Social Gatherings with Friends and Family: Twice a week    Attends Religious Services: More than 4 times per year    Active Member of [employer] or Organizations: No    Attends Music therapist: Never    Marital Status: Never married    Tobacco Counseling Counseling given: Not Answered   Clinical Intake:  Pre-visit preparation completed: Yes  Pain : No/denies pain     Nutritional Risks: None Diabetes: No  How often do you need to have someone help you when you read instructions, pamphlets, or other written materials from your doctor or pharmacy?: 1 - Never  Diabetic?no  Interpreter Needed?: No  Information entered by :: Kirke Shaggy, LPN   Activities of Daily Living    09/16/2022    1:10 PM  In your present state of health, do you have any difficulty performing the following activities:  Hearing? 0  Vision? 0  Difficulty concentrating or making decisions? 0  Walking or climbing stairs? 0  Dressing or bathing? 0  Doing errands, shopping? 0  Preparing Food and eating ? N  Using the Toilet? N  In the past six months, have you accidently leaked urine? N  Do you have problems with loss of bowel control? N  Managing your Medications? N  Managing your Finances? N  Housekeeping or managing your Housekeeping? N     Patient Care Team: Virginia Crews, MD as PCP - General (Family Medicine) Yolonda Kida, MD as Consulting Physician (Cardiology)  Indicate any recent Medical Services you may have received from other than [facility] providers in the past year (date may be approximate).     Assessment:   This is a routine wellness examination for Marissa Gilmore.  Hearing/Vision screen Hearing Screening - Comments:: No aids  Vision Screening - Comments:: No glasses   Dietary issues and exercise activities discussed: Current Exercise Habits: Home exercise routine, Type of exercise: walking, Time (Minutes): 30, Frequency (Times/Week): 3, Weekly Exercise (Minutes/Week): 90, Intensity: Mild   Goals Addressed  This Visit's Progress    DIET - EAT MORE FRUITS AND VEGETABLES         Depression Screen    09/16/2022    1:06 PM 09/11/2020    1:41 PM 03/31/2019   10:32 AM 03/31/2019   10:25 AM 03/25/2018   10:49 AM 07/29/2017    2:15 PM  PHQ 2/9 Scores  PHQ - 2 Score 0 2 0 0 0 0  PHQ- 9 Score 0 12 1       Fall Risk    09/16/2022    1:08 PM 09/11/2020    1:40 PM 09/02/2019    2:58 PM 03/31/2019   10:25 AM 03/25/2018   10:49 AM  Fall Risk   Falls in the past year? 0 0 0 0 No  Number falls in past yr: 0 0 0    Injury with Fall? 0 0 0    Risk for fall due to : No Fall Risks No Fall Risks     Follow up Falls prevention discussed;Falls evaluation completed Falls evaluation completed Falls evaluation completed      FALL RISK PREVENTION PERTAINING TO THE HOME:  Any stairs in or around the home? No  If so, are there any without handrails? No  Home free of loose throw rugs in walkways, pet beds, electrical cords, etc? Yes  Adequate lighting in your home to reduce risk of falls? Yes   ASSISTIVE DEVICES UTILIZED TO PREVENT FALLS:  Life alert? No  Use of a cane, walker or w/c? No  Grab bars in the bathroom? No  Shower chair or bench in shower? No  Elevated toilet seat or a handicapped  toilet? No   Cognitive Function:        09/16/2022    1:10 PM  6CIT Screen  What Year? 0 points  What month? 0 points  What time? 0 points  Count back from 20 0 points  Months in reverse 0 points  Repeat phrase 0 points  Total Score 0 points    Immunizations Immunization History  Administered Date(s) Administered   Fluad Quad(high Dose 65+) 09/02/2019, 09/11/2020   PFIZER(Purple Top)SARS-COV-2 Vaccination 12/02/2019, 12/27/2019, 09/11/2020    TDAP status: Due, Education has been provided regarding the importance of this vaccine. Advised may receive this vaccine at local pharmacy or Health Dept. Aware to provide a copy of the vaccination record if obtained from local pharmacy or Health Dept. Verbalized acceptance and understanding.  Flu Vaccine status: Due, Education has been provided regarding the importance of this vaccine. Advised may receive this vaccine at local pharmacy or Health Dept. Aware to provide a copy of the vaccination record if obtained from local pharmacy or Health Dept. Verbalized acceptance and understanding.  Pneumococcal vaccine status: Due, Education has been provided regarding the importance of this vaccine. Advised may receive this vaccine at local pharmacy or Health Dept. Aware to provide a copy of the vaccination record if obtained from local pharmacy or Health Dept. Verbalized acceptance and understanding.  Covid-19 vaccine status: Completed vaccines  Qualifies for Shingles Vaccine? Yes   Zostavax completed No   Shingrix Completed?: No.    Education has been provided regarding the importance of this vaccine. Patient has been advised to call insurance company to determine out of pocket expense if they have not yet received this vaccine. Advised may also receive vaccine at local pharmacy or Health Dept. Verbalized acceptance and understanding.  Screening Tests Health Maintenance  Topic Date Due   TETANUS/TDAP  Never  done   Zoster Vaccines- Shingrix (1  of 2) Never done   Pneumonia Vaccine 60+ Years old (1 - PCV) Never done   DEXA SCAN  Never done   COVID-19 Vaccine (4 - Pfizer risk series) 11/06/2020   INFLUENZA VACCINE  06/04/2022   Medicare Annual Wellness (AWV)  09/17/2023   HPV VACCINES  Aged Out    Health Maintenance  Health Maintenance Due  Topic Date Due   TETANUS/TDAP  Never done   Zoster Vaccines- Shingrix (1 of 2) Never done   Pneumonia Vaccine 57+ Years old (1 - PCV) Never done   DEXA SCAN  Never done   COVID-19 Vaccine (4 - Pfizer risk series) 11/06/2020   INFLUENZA VACCINE  06/04/2022    Colorectal cancer screening: No longer required.   Mammogram status: No longer required due to age.   Lung Cancer Screening: (Low Dose CT Chest recommended if Age 46-80 years, 30 pack-year currently smoking OR have quit w/in 15years.) does not qualify.   Additional Screening:  Hepatitis C Screening: does not qualify; Completed no  Vision Screening: Recommended annual ophthalmology exams for early detection of glaucoma and other disorders of the eye. Is the patient up to date with their annual eye exam?  No  Who is the provider or what is the name of the office in which the patient attends annual eye exams? No one If pt is not established with a provider, would they like to be referred to a provider to establish care? No .   Dental Screening: Recommended annual dental exams for proper oral hygiene  Community Resource Referral / Chronic Care Management: CRR required this visit?  No   CCM required this visit?  No      Plan:     I have personally reviewed and noted the following in the patient's chart:   Medical and social history Use of alcohol, tobacco or illicit drugs  Current medications and supplements including opioid prescriptions. Patient is not currently taking opioid prescriptions. Functional ability and status Nutritional status Physical activity Advanced directives List of other  physicians Hospitalizations, surgeries, and ER visits in previous 12 months Vitals Screenings to include cognitive, depression, and falls Referrals and appointments  In addition, I have reviewed and discussed with patient certain preventive protocols, quality metrics, and best practice recommendations. A written personalized care plan for preventive services as well as general preventive health recommendations were provided to patient.     Dionisio David, LPN   [id number]   Nurse Notes: none

## 2022-09-16 NOTE — Patient Instructions (Signed)
[name] , Thank you for taking time to come for your Medicare Wellness Visit. I appreciate your ongoing commitment to your health goals. Please review the following plan we discussed and let me know if I can assist you in the future.   Screening recommendations/referrals: Colonoscopy: aged out Mammogram: aged out Bone Density: aged out Recommended yearly ophthalmology/optometry visit for glaucoma screening and checkup Recommended yearly dental visit for hygiene and checkup  Vaccinations: Influenza vaccine: n/d Pneumococcal vaccine: n/d Tdap vaccine: n/d Shingles vaccine: n/d   Covid-19:12/02/19, 12/27/19, 09/11/20   Advanced directives: no  Conditions/risks identified: none  Next appointment: Follow up in one year for your annual wellness visit 09/18/23 @ 9:30 am by phone   Preventive Care 65 Years and Older, Female Preventive care refers to lifestyle choices and visits with your health care provider that can promote health and wellness. What does preventive care include? A yearly physical exam. This is also called an annual well check. Dental exams once or twice a year. Routine eye exams. Ask your health care provider how often you should have your eyes checked. Personal lifestyle choices, including: Daily care of your teeth and gums. Regular physical activity. Eating a healthy diet. Avoiding tobacco and drug use. Limiting alcohol use. Practicing safe sex. Taking low-dose aspirin every day. Taking vitamin and mineral supplements as recommended by your health care provider. What happens during an annual well check? The services and screenings done by your health care provider during your annual well check will depend on your age, overall health, lifestyle risk factors, and family history of disease. Counseling  Your health care provider may ask you questions about your: Alcohol use. Tobacco use. Drug use. Emotional well-being. Home and relationship well-being. Sexual  activity. Eating habits. History of falls. Memory and ability to understand (cognition). Work and work Statistician. Reproductive health. Screening  You may have the following tests or measurements: Height, weight, and BMI. Blood pressure. Lipid and cholesterol levels. These may be checked every 5 years, or more frequently if you are over 35 years old. Skin check. Lung cancer screening. You may have this screening every year starting at age 49 if you have a 30-pack-year history of smoking and currently smoke or have quit within the past 15 years. Fecal occult blood test (FOBT) of the stool. You may have this test every year starting at age 53. Flexible sigmoidoscopy or colonoscopy. You may have a sigmoidoscopy every 5 years or a colonoscopy every 10 years starting at age 18. Hepatitis C blood test. Hepatitis B blood test. Sexually transmitted disease (STD) testing. Diabetes screening. This is done by checking your blood sugar (glucose) after you have not eaten for a while (fasting). You may have this done every 1-3 years. Bone density scan. This is done to screen for osteoporosis. You may have this done starting at age 28. Mammogram. This may be done every 1-2 years. Talk to your health care provider about how often you should have regular mammograms. Talk with your health care provider about your test results, treatment options, and if necessary, the need for more tests. Vaccines  Your health care provider may recommend certain vaccines, such as: Influenza vaccine. This is recommended every year. Tetanus, diphtheria, and acellular pertussis (Tdap, Td) vaccine. You may need a Td booster every 10 years. Zoster vaccine. You may need this after age 7. Pneumococcal 13-valent conjugate (PCV13) vaccine. One dose is recommended after age 66. Pneumococcal polysaccharide (PPSV23) vaccine. One dose is recommended after age 44. Talk  to your health care provider about which screenings and vaccines  you need and how often you need them. This information is not intended to replace advice given to you by your health care provider. Make sure you discuss any questions you have with your health care provider. Document Released: 11/17/2015 Document Revised: 07/10/2016 Document Reviewed: 08/22/2015 Elsevier Interactive Patient Education  2017  Prevention in the Home Falls can cause injuries. They can happen to people of all ages. There are many things you can do to make your home safe and to help prevent falls. What can I do on the outside of my home? Regularly fix the edges of walkways and driveways and fix any cracks. Remove anything that might make you trip as you walk through a door, such as a raised step or threshold. Trim any bushes or trees on the path to your home. Use bright outdoor lighting. Clear any walking paths of anything that might make someone trip, such as rocks or tools. Regularly check to see if handrails are loose or broken. Make sure that both sides of any steps have handrails. Any raised decks and porches should have guardrails on the edges. Have any leaves, snow, or ice cleared regularly. Use sand or salt on walking paths during winter. Clean up any spills in your garage right away. This includes oil or grease spills. What can I do in the bathroom? Use night lights. Install grab bars by the toilet and in the tub and shower. Do not use towel bars as grab bars. Use non-skid mats or decals in the tub or shower. If you need to sit down in the shower, use a plastic, non-slip stool. Keep the floor dry. Clean up any water that spills on the floor as soon as it happens. Remove soap buildup in the tub or shower regularly. Attach bath mats securely with double-sided non-slip rug tape. Do not have throw rugs and other things on the floor that can make you trip. What can I do in the bedroom? Use night lights. Make sure that you have a light by your bed that  is easy to reach. Do not use any sheets or blankets that are too big for your bed. They should not hang down onto the floor. Have a firm chair that has side arms. You can use this for support while you get dressed. Do not have throw rugs and other things on the floor that can make you trip. What can I do in the kitchen? Clean up any spills right away. Avoid walking on wet floors. Keep items that you use a lot in easy-to-reach places. If you need to reach something above you, use a strong step stool that has a grab bar. Keep electrical cords out of the way. Do not use floor polish or wax that makes floors slippery. If you must use wax, use non-skid floor wax. Do not have throw rugs and other things on the floor that can make you trip. What can I do with my stairs? Do not leave any items on the stairs. Make sure that there are handrails on both sides of the stairs and use them. Fix handrails that are broken or loose. Make sure that handrails are as long as the stairways. Check any carpeting to make sure that it is firmly attached to the stairs. Fix any carpet that is loose or worn. Avoid having throw rugs at the top or bottom of the stairs. If you do have throw rugs,  attach them to the floor with carpet tape. Make sure that you have a light switch at the top of the stairs and the bottom of the stairs. If you do not have them, ask someone to add them for you. What else can I do to help prevent falls? Wear shoes that: Do not have high heels. Have rubber bottoms. Are comfortable and fit you well. Are closed at the toe. Do not wear sandals. If you use a stepladder: Make sure that it is fully opened. Do not climb a closed stepladder. Make sure that both sides of the stepladder are locked into place. Ask someone to hold it for you, if possible. Clearly mark and make sure that you can see: Any grab bars or handrails. First and last steps. Where the edge of each step is. Use tools that help you  move around (mobility aids) if they are needed. These include: Canes. Walkers. Scooters. Crutches. Turn on the lights when you go into a dark area. Replace any light bulbs as soon as they burn out. Set up your furniture so you have a clear path. Avoid moving your furniture around. If any of your floors are uneven, fix them. If there are any pets around you, be aware of where they are. Review your medicines with your doctor. Some medicines can make you feel dizzy. This can increase your chance of falling. Ask your doctor what other things that you can do to help prevent falls. This information is not intended to replace advice given to you by your health care provider. Make sure you discuss any questions you have with your health care provider. Document Released: 08/17/2009 Document Revised: 03/28/2016 Document Reviewed: 11/25/2014 Elsevier Interactive Patient Education  2017 [employer].

## 2022-10-31 ENCOUNTER — Other Ambulatory Visit: Payer: Self-pay | Admitting: Family Medicine

## 2023-01-28 ENCOUNTER — Other Ambulatory Visit: Payer: Self-pay | Admitting: Family Medicine

## 2023-01-28 NOTE — Telephone Encounter (Signed)
Patient says that she never requested a refill for Atorvastatin. She understood that she needs to schedule an appointment before a refill can be sent. Patient confirmed that she will call at a later date to schedule.

## 2023-02-06 ENCOUNTER — Encounter: Payer: Self-pay | Admitting: Family Medicine

## 2023-02-06 ENCOUNTER — Ambulatory Visit: Payer: Medicare Other | Admitting: Family Medicine

## 2023-02-06 VITALS — BP 114/76 | HR 74 | Temp 97.6°F | Resp 20 | Wt 130.9 lb

## 2023-02-06 DIAGNOSIS — E782 Mixed hyperlipidemia: Secondary | ICD-10-CM | POA: Diagnosis not present

## 2023-02-06 DIAGNOSIS — Z7901 Long term (current) use of anticoagulants: Secondary | ICD-10-CM

## 2023-02-06 DIAGNOSIS — I10 Essential (primary) hypertension: Secondary | ICD-10-CM

## 2023-02-06 DIAGNOSIS — I110 Hypertensive heart disease with heart failure: Secondary | ICD-10-CM

## 2023-02-06 DIAGNOSIS — I739 Peripheral vascular disease, unspecified: Secondary | ICD-10-CM

## 2023-02-06 DIAGNOSIS — R7303 Prediabetes: Secondary | ICD-10-CM

## 2023-02-06 DIAGNOSIS — I5022 Chronic systolic (congestive) heart failure: Secondary | ICD-10-CM

## 2023-02-06 DIAGNOSIS — I480 Paroxysmal atrial fibrillation: Secondary | ICD-10-CM

## 2023-02-06 MED ORDER — ATORVASTATIN CALCIUM 40 MG PO TABS: 40.0000 mg | ORAL_TABLET | Freq: Every day | ORAL | 3 refills | Status: DC

## 2023-02-06 NOTE — Assessment & Plan Note (Addendum)
Chronic and stable.  Continue current medications.  Ordered CMet. Continue to follow up with cardiology.

## 2023-02-06 NOTE — Assessment & Plan Note (Addendum)
Chronic and stable.  Continue current medications.  Ordered CMet. Follow up at annual physical in 1 year.

## 2023-02-06 NOTE — Assessment & Plan Note (Addendum)
Chronic and compensated. Managed by cardiology.  Continue current medications. Continue to follow up with cardiology.

## 2023-02-06 NOTE — Assessment & Plan Note (Signed)
Chronic and unclear control. Last Hgb A1c 6.2 in 2021.  Ordered HgB A1c. Return for annual physical in 1 year.

## 2023-02-06 NOTE — Assessment & Plan Note (Addendum)
Chronic and control unclear. Last lipid panel in 2022 showed normal values except for elevated LDL.  Continue atorvastatin 40 MG.  Ordered CMet and lipid panel.  Follow up in 1 year at annual physical.

## 2023-02-06 NOTE — Assessment & Plan Note (Signed)
Chronic and stable.  Managed by cardiology.  Continue current medications.  Ordered CBC.  Continue to follow up with cardiology.

## 2023-02-06 NOTE — Progress Notes (Signed)
Established Patient Office Visit  Subjective   Patient ID: Marissa Gilmore, female    DOB: Dec 02, 1930  Age: 89 y.o. MRN: [id number]  Chief Complaint  Patient presents with   Hypertension   Hyperlipidemia    Hypertension Pertinent negatives include no chest pain, palpitations or shortness of breath.  Hyperlipidemia Pertinent negatives include no chest pain or shortness of breath.    Hypertension Occasionally takes home blood pressure but stopped since pressure always looked good when seeing cardiology.  Hyperlipidemia Needs a refill of her statin.   Prediabetes Does not track her blood sugar at home.   Congestive Heart Failure Regularly follows up with cardiology. Feels good at home and at today's visit. Has no concerns about her energy.   Paroxysmal Atrial Fibrillation  Regularly follows up with cardiology.   Moderate Vascular Occlusion Has pain and pressure bilaterally in her feet that occasionally wakes her from sleep. The pain improves when she gets up and moves. She has also noted sore spots and cuts that "just appear."     Review of Systems  Constitutional: Negative.   Respiratory:  Negative for cough and shortness of breath.   Cardiovascular:  Negative for chest pain and palpitations.  Gastrointestinal: Negative.   Genitourinary: Negative.   Musculoskeletal:        Pain/pressure bilaterally in her feet.   Skin:  Negative for itching and rash.  Neurological:  Negative for dizziness, tingling, sensory change and weakness.      Objective:     BP 114/76 (BP Location: Left Arm, Patient Position: Sitting, Cuff Size: Normal)   Pulse 74   Temp 97.6 F (36.4 C) (Temporal)   Resp 20   Wt 130 lb 14.4 oz (59.4 kg)   SpO2 95%   BMI 22.47 kg/m    Physical Exam Cardiovascular:     Rate and Rhythm: Normal rate. Rhythm irregularly irregular.     Heart sounds: No murmur heard. Pulmonary:     Breath sounds: Normal breath sounds. No wheezing, rhonchi or  rales.  Musculoskeletal:     Right lower leg: No tenderness. No edema.     Left lower leg: No tenderness. 2+ Pitting Edema present.     Comments: Right ankle: Ulcer with crust, 1 cm in diameter, located over the lateral malleolus.   Skin:    Comments: Erythema of the lower legs and feet bilaterally.   Neurological:     Mental Status: She is alert.      No results found for any visits on 02/06/23.    The ASCVD Risk score (Arnett DK, et al., 2019) failed to calculate for the following reasons:   The 2019 ASCVD risk score is only valid for ages 75 to 35    Assessment & Plan:   Problem List Items Addressed This Visit     Hypertension - Primary    Chronic and stable.  Continue current medications.  Ordered CMet. Follow up at annual physical in 1 year.       Relevant Medications   atorvastatin (LIPITOR) 40 MG tablet   Other Relevant Orders   Comprehensive metabolic panel   Mixed hyperlipidemia    Chronic and control unclear. Last lipid panel in 2022 showed normal values except for elevated LDL.  Continue atorvastatin 40 MG.  Ordered CMet and lipid panel.  Follow up in 1 year at annual physical.       Relevant Medications   atorvastatin (LIPITOR) 40 MG tablet   Other Relevant Orders  Comprehensive metabolic panel   Lipid panel   Prediabetes    Chronic and unclear control. Last Hgb A1c 6.2 in 2021.  Ordered HgB A1c. Return for annual physical in 1 year.        Relevant Orders   Hemoglobin [id number]   Chronic systolic (congestive) heart failure    Chronic and compensated. Managed by cardiology.  Continue current medications. Continue to follow up with cardiology.      Relevant Medications   atorvastatin (LIPITOR) 40 MG tablet   Paroxysmal atrial fibrillation    Chronic and stable.  Managed by cardiology.  Continue current medications.  Ordered CBC.  Continue to follow up with cardiology.       Relevant Medications   atorvastatin (LIPITOR) 40 MG tablet    Other Relevant Orders   CBC   Chronic anticoagulation    Anticoagulation continued for paroxysmal atrial fibrillation.  Continue current medications. Ordered CBC.       Relevant Orders   CBC   Hypertensive heart disease with heart failure    Chronic and stable.  Continue current medications.  Ordered CMet. Continue to follow up with cardiology.        Relevant Medications   atorvastatin (LIPITOR) 40 MG tablet   Other Relevant Orders   Comprehensive metabolic panel   Other Visit Diagnoses     PVD (peripheral vascular disease)       Chronic and worsening.  Referral to vascular surgery for evaluation.   Relevant Medications   atorvastatin (LIPITOR) 40 MG tablet   Other Relevant Orders   Ambulatory referral to Vascular Surgery       Return in about 1 year (around 02/06/2024) for chronic disease f/u.    Wynona Dove, Medical Student   Patient seen along with MS3 student Marissa Gilmore. I personally evaluated this patient along with the student, and verified all aspects of the history, physical exam, and medical decision making as documented by the student. I agree with the student's documentation and have made all necessary edits.  Nobuko Gsell, Dionne Bucy, MD, MPH [facility]

## 2023-02-06 NOTE — Assessment & Plan Note (Signed)
Anticoagulation continued for paroxysmal atrial fibrillation.  Continue current medications. Ordered CBC.

## 2023-02-07 LAB — CBC
Hematocrit: 42.5 % (ref 34.0–46.6)
Hemoglobin: 13.9 g/dL (ref 11.1–15.9)
MCH: 31.1 pg (ref 26.6–33.0)
MCHC: 32.7 g/dL (ref 31.5–35.7)
MCV: 95 fL (ref 79–97)
Platelets: 170 10*3/uL (ref 150–450)
RBC: 4.47 x10E6/uL (ref 3.77–5.28)
RDW: 12.6 % (ref 11.7–15.4)
WBC: 7.1 10*3/uL (ref 3.4–10.8)

## 2023-02-07 LAB — COMPREHENSIVE METABOLIC PANEL
ALT: 19 IU/L (ref 0–32)
AST: 15 IU/L (ref 0–40)
Albumin/Globulin Ratio: 1.7 (ref 1.2–2.2)
Albumin: 4.1 g/dL (ref 3.7–4.7)
Alkaline Phosphatase: 108 IU/L (ref 44–121)
BUN/Creatinine Ratio: 16 (ref 12–28)
BUN: 24 mg/dL (ref 8–27)
Bilirubin Total: 0.7 mg/dL (ref 0.0–1.2)
CO2: 21 mmol/L (ref 20–29)
Calcium: 10.3 mg/dL (ref 8.7–10.3)
Chloride: 103 mmol/L (ref 96–106)
Creatinine, Ser: 1.5 mg/dL — ABNORMAL HIGH (ref 0.57–1.00)
Globulin, Total: 2.4 g/dL (ref 1.5–4.5)
Glucose: 121 mg/dL — ABNORMAL HIGH (ref 70–99)
Potassium: 4.7 mmol/L (ref 3.5–5.2)
Sodium: 141 mmol/L (ref 134–144)
Total Protein: 6.5 g/dL (ref 6.0–8.5)
eGFR: 33 mL/min/{1.73_m2} — ABNORMAL LOW (ref >59)

## 2023-02-07 LAB — LIPID PANEL
Chol/HDL Ratio: 2.7 ratio (ref 0.0–4.4)
Cholesterol, Total: 182 mg/dL (ref 100–199)
HDL: 67 mg/dL (ref >39)
LDL Chol Calc (NIH): 93 mg/dL (ref 0–99)
Triglycerides: 126 mg/dL (ref 0–149)
VLDL Cholesterol Cal: 22 mg/dL (ref 5–40)

## 2023-02-07 LAB — HEMOGLOBIN A1C
Est. average glucose Bld gHb Est-mCnc: 137 mg/dL
Hgb A1c MFr Bld: 6.4 % — ABNORMAL HIGH (ref 4.8–5.6)

## 2023-03-19 ENCOUNTER — Other Ambulatory Visit (INDEPENDENT_AMBULATORY_CARE_PROVIDER_SITE_OTHER): Payer: Self-pay | Admitting: Nurse Practitioner

## 2023-03-19 DIAGNOSIS — I739 Peripheral vascular disease, unspecified: Secondary | ICD-10-CM

## 2023-03-24 ENCOUNTER — Encounter (INDEPENDENT_AMBULATORY_CARE_PROVIDER_SITE_OTHER): Payer: Medicare Other | Admitting: Vascular Surgery

## 2023-03-24 ENCOUNTER — Encounter (INDEPENDENT_AMBULATORY_CARE_PROVIDER_SITE_OTHER): Payer: Medicare Other

## 2023-04-14 ENCOUNTER — Ambulatory Visit (INDEPENDENT_AMBULATORY_CARE_PROVIDER_SITE_OTHER): Payer: Medicare Other | Admitting: Vascular Surgery

## 2023-04-14 ENCOUNTER — Encounter (INDEPENDENT_AMBULATORY_CARE_PROVIDER_SITE_OTHER): Payer: Self-pay | Admitting: Vascular Surgery

## 2023-04-14 ENCOUNTER — Ambulatory Visit (INDEPENDENT_AMBULATORY_CARE_PROVIDER_SITE_OTHER): Payer: Medicare Other

## 2023-04-14 VITALS — BP 119/81 | HR 132 | Resp 18 | Ht 64.0 in | Wt 130.4 lb

## 2023-04-14 DIAGNOSIS — I10 Essential (primary) hypertension: Secondary | ICD-10-CM

## 2023-04-14 DIAGNOSIS — M170 Bilateral primary osteoarthritis of knee: Secondary | ICD-10-CM

## 2023-04-14 DIAGNOSIS — E782 Mixed hyperlipidemia: Secondary | ICD-10-CM

## 2023-04-14 DIAGNOSIS — I70213 Atherosclerosis of native arteries of extremities with intermittent claudication, bilateral legs: Secondary | ICD-10-CM

## 2023-04-14 DIAGNOSIS — I480 Paroxysmal atrial fibrillation: Secondary | ICD-10-CM

## 2023-04-14 DIAGNOSIS — I70219 Atherosclerosis of native arteries of extremities with intermittent claudication, unspecified extremity: Secondary | ICD-10-CM | POA: Insufficient documentation

## 2023-04-14 DIAGNOSIS — I739 Peripheral vascular disease, unspecified: Secondary | ICD-10-CM

## 2023-04-14 NOTE — Progress Notes (Signed)
MRN : [id number]  Marissa Gilmore is a 89 y.o. (1931-06-07) female who presents with chief complaint of check circulation.  History of Present Illness:   The patient presents to the office for initial evaluation and review of the noninvasive studies.   There have been no recent changes in lower extremity symptoms. No shortening of the patient's claudication distance or development of rest pain symptoms. No ulcers or wounds have occurred since the last visit.  There have been no significant changes to the patient's overall health care.  The patient denies amaurosis fugax or recent TIA symptoms. There are no documented recent neurological changes noted. There is no history of DVT, PE or superficial thrombophlebitis. The patient denies recent episodes of angina or shortness of breath.   ABI Rt=0.52 and Lt=0.46 (monophasic signals bilaterally)    Current Meds  Medication Sig   atorvastatin (LIPITOR) 40 MG tablet Take 1 tablet (40 mg total) by mouth daily.   ELIQUIS 2.5 MG TABS tablet SMARTSIG:1 Tablet(s) By Mouth Every 12 Hours   furosemide (LASIX) 40 MG tablet TAKE 1 TABLET BY MOUTH EVERY DAY   metoprolol tartrate (LOPRESSOR) 25 MG tablet Take 25 mg by mouth 2 (two) times daily.    potassium chloride (KLOR-CON) 20 MEQ packet Take 20 mEq by mouth daily.   sacubitril-valsartan (ENTRESTO) 24-26 MG Take 1 tablet by mouth 2 (two) times daily.    spironolactone (ALDACTONE) 25 MG tablet Take 12.5 mg by mouth daily.   torsemide (DEMADEX) 20 MG tablet Take 20 mg by mouth 2 (two) times daily.    Past Medical History:  Diagnosis Date   Anxiety    Cataract    Hypertension     Past Surgical History:  Procedure Laterality Date   CATARACT EXTRACTION Right     Social History Social History   Tobacco Use   Smoking status: Never   Smokeless tobacco: Never  Vaping Use   Vaping Use: Never used  Substance Use  Topics   Alcohol use: No   Drug use: No    Family History Family History  Problem Relation Age of Onset   Heart disease Mother        no MI   Peripheral Artery Disease Mother 66   Bladder Cancer Father 64    No Known Allergies   REVIEW OF SYSTEMS (Negative unless checked)  Constitutional: [] Weight loss  [] Fever  [] Chills Cardiac: [] Chest pain   [] Chest pressure   [] Palpitations   [] Shortness of breath when laying flat   [] Shortness of breath with exertion. Vascular:  [x] Pain in legs with walking   [] Pain in legs at rest  [] History of DVT   [] Phlebitis   [] Swelling in legs   [] Varicose veins   [] Non-healing ulcers Pulmonary:   [] Uses home oxygen   [] Productive cough   [] Hemoptysis   [] Wheeze  [] COPD   [] Asthma Neurologic:  [] Dizziness   [] Seizures   [] History of stroke   [] History of TIA  [] Aphasia   [] Vissual changes   [] Weakness or numbness in arm   [] Weakness or numbness in leg Musculoskeletal:   []   Joint swelling   [] Joint pain   [] Low back pain Hematologic:  [] Easy bruising  [] Easy bleeding   [] Hypercoagulable state   [] Anemic Gastrointestinal:  [] Diarrhea   [] Vomiting  [] Gastroesophageal reflux/heartburn   [] Difficulty swallowing. Genitourinary:  [] Chronic kidney disease   [] Difficult urination  [] Frequent urination   [] Blood in urine Skin:  [] Rashes   [] Ulcers  Psychological:  [] History of anxiety   []  History of major depression.  Physical Examination  Vitals:   04/14/23 1427  BP: 119/81  Pulse: (!) 132  Resp: 18  Weight: 130 lb 6.4 oz (59.1 kg)  Height: 5\' 4"  (1.626 m)   Body mass index is 22.38 kg/m. Gen: WD/WN, NAD Head: [place]/AT, No temporalis wasting.  Ear/Nose/Throat: Hearing grossly intact, nares w/o erythema or drainage Eyes: PER, EOMI, sclera nonicteric.  Neck: Supple, no masses.  No bruit or JVD.  Pulmonary:  Good air movement, no audible wheezing, no use of accessory muscles.  Cardiac: RRR, normal S1, S2, no Murmurs. Vascular:  moderate trophic changes,  no open wounds Vessel Right Left  Radial Palpable Palpable  PT Not Palpable Not Palpable  DP Not Palpable Not Palpable  Gastrointestinal: soft, non-distended. No guarding/no peritoneal signs.  Musculoskeletal: M/S 5/5 throughout.  No visible deformity.  Neurologic: CN 2-12 intact. Pain and light touch intact in extremities.  Symmetrical.  Speech is fluent. Motor exam as listed above. Psychiatric: Judgment intact, Mood & affect appropriate for pt's clinical situation. Dermatologic: No rashes or ulcers noted.  No changes consistent with cellulitis.   CBC Lab Results  Component Value Date   WBC 7.1 02/06/2023   HGB 13.9 02/06/2023   HCT 42.5 02/06/2023   MCV 95 02/06/2023   PLT 170 02/06/2023    BMET    Component Value Date/Time   NA 141 02/06/2023 1348   K 4.7 02/06/2023 1348   CL 103 02/06/2023 1348   CO2 21 02/06/2023 1348   GLUCOSE 121 (H) 02/06/2023 1348   GLUCOSE 102 (H) 10/03/2017 1559   BUN 24 02/06/2023 1348   CREATININE 1.50 (H) 02/06/2023 1348   CREATININE 0.83 10/03/2017 1559   CALCIUM 10.3 02/06/2023 1348   GFRNONAA 43 (L) 10/02/2020 1358   GFRNONAA 65 10/03/2017 1559   GFRAA 50 (L) 10/02/2020 1358   GFRAA 75 10/03/2017 1559   CrCl cannot be calculated (Patient's most recent lab result is older than the maximum 21 days allowed.).  COAG No results found for: "INR", "PROTIME"  Radiology No results found.   Assessment/Plan 1. Atherosclerosis of native artery of both lower extremities with intermittent claudication (HCC)  Recommend:  The patient has evidence of atherosclerosis of the lower extremities with claudication.  The patient does not voice lifestyle limiting changes at this point in time.  Noninvasive studies do not suggest clinically significant change.  No invasive studies, angiography or surgery at this time The patient should continue walking and begin a more formal exercise program.  The patient should continue antiplatelet therapy and  aggressive treatment of the lipid abnormalities  No changes in the patient's medications at this time  Continued surveillance is indicated as atherosclerosis is likely to progress with time.    The patient will continue follow up with noninvasive studies as ordered.  - VAS [place] ABI WITH/WO TBI; Future  2. Paroxysmal atrial fibrillation (HCC) Continue antiarrhythmia medications as already ordered, these medications have been reviewed and there are no changes at this time.  Continue anticoagulation as ordered by Cardiology Service  3. Primary hypertension Continue  antihypertensive medications as already ordered, these medications have been reviewed and there are no changes at this time.  4. Osteoarthritis of both knees, unspecified osteoarthritis type Continue NSAID medications as already ordered, these medications have been reviewed and there are no changes at this time.  Continued activity and therapy was stressed.  5. Mixed hyperlipidemia Continue statin as ordered and reviewed, no changes at this time    Levora Dredge, MD  04/14/2023 3:16 PM

## 2023-04-17 LAB — VAS US ABI WITH/WO TBI
Left ABI: 0.46
Right ABI: 0.52

## 2023-07-08 ENCOUNTER — Other Ambulatory Visit: Payer: Self-pay

## 2023-07-08 ENCOUNTER — Emergency Department: Payer: Medicare Other

## 2023-07-08 ENCOUNTER — Inpatient Hospital Stay
Admission: EM | Admit: 2023-07-08 | Discharge: 2023-07-18 | DRG: 981 | Disposition: A | Discharge status: Hospice | Payer: Medicare Other | Attending: Internal Medicine | Admitting: Internal Medicine

## 2023-07-08 DIAGNOSIS — R4701 Aphasia: Secondary | ICD-10-CM | POA: Diagnosis present

## 2023-07-08 DIAGNOSIS — Z66 Do not resuscitate: Secondary | ICD-10-CM | POA: Diagnosis not present

## 2023-07-08 DIAGNOSIS — I82412 Acute embolism and thrombosis of left femoral vein: Secondary | ICD-10-CM | POA: Diagnosis not present

## 2023-07-08 DIAGNOSIS — D259 Leiomyoma of uterus, unspecified: Secondary | ICD-10-CM | POA: Diagnosis present

## 2023-07-08 DIAGNOSIS — I472 Ventricular tachycardia, unspecified: Secondary | ICD-10-CM | POA: Diagnosis present

## 2023-07-08 DIAGNOSIS — I428 Other cardiomyopathies: Secondary | ICD-10-CM | POA: Diagnosis present

## 2023-07-08 DIAGNOSIS — N950 Postmenopausal bleeding: Secondary | ICD-10-CM | POA: Diagnosis present

## 2023-07-08 DIAGNOSIS — D519 Vitamin B12 deficiency anemia, unspecified: Secondary | ICD-10-CM | POA: Diagnosis present

## 2023-07-08 DIAGNOSIS — I5022 Chronic systolic (congestive) heart failure: Secondary | ICD-10-CM | POA: Diagnosis present

## 2023-07-08 DIAGNOSIS — R278 Other lack of coordination: Secondary | ICD-10-CM | POA: Diagnosis present

## 2023-07-08 DIAGNOSIS — R9389 Abnormal findings on diagnostic imaging of other specified body structures: Secondary | ICD-10-CM | POA: Diagnosis present

## 2023-07-08 DIAGNOSIS — N1831 Chronic kidney disease, stage 3a: Secondary | ICD-10-CM | POA: Diagnosis present

## 2023-07-08 DIAGNOSIS — I4819 Other persistent atrial fibrillation: Secondary | ICD-10-CM | POA: Diagnosis present

## 2023-07-08 DIAGNOSIS — Z8052 Family history of malignant neoplasm of bladder: Secondary | ICD-10-CM

## 2023-07-08 DIAGNOSIS — R1312 Dysphagia, oropharyngeal phase: Secondary | ICD-10-CM | POA: Diagnosis present

## 2023-07-08 DIAGNOSIS — Z7982 Long term (current) use of aspirin: Secondary | ICD-10-CM

## 2023-07-08 DIAGNOSIS — I2699 Other pulmonary embolism without acute cor pulmonale: Secondary | ICD-10-CM | POA: Diagnosis not present

## 2023-07-08 DIAGNOSIS — T45515A Adverse effect of anticoagulants, initial encounter: Secondary | ICD-10-CM | POA: Diagnosis present

## 2023-07-08 DIAGNOSIS — R339 Retention of urine, unspecified: Secondary | ICD-10-CM | POA: Diagnosis present

## 2023-07-08 DIAGNOSIS — D696 Thrombocytopenia, unspecified: Secondary | ICD-10-CM | POA: Diagnosis not present

## 2023-07-08 DIAGNOSIS — E870 Hyperosmolality and hypernatremia: Secondary | ICD-10-CM | POA: Diagnosis not present

## 2023-07-08 DIAGNOSIS — I6350 Cerebral infarction due to unspecified occlusion or stenosis of unspecified cerebral artery: Secondary | ICD-10-CM | POA: Diagnosis not present

## 2023-07-08 DIAGNOSIS — D62 Acute posthemorrhagic anemia: Secondary | ICD-10-CM | POA: Diagnosis present

## 2023-07-08 DIAGNOSIS — R54 Age-related physical debility: Secondary | ICD-10-CM | POA: Diagnosis present

## 2023-07-08 DIAGNOSIS — Z7901 Long term (current) use of anticoagulants: Secondary | ICD-10-CM

## 2023-07-08 DIAGNOSIS — R29810 Facial weakness: Secondary | ICD-10-CM | POA: Diagnosis present

## 2023-07-08 DIAGNOSIS — I639 Cerebral infarction, unspecified: Secondary | ICD-10-CM | POA: Diagnosis present

## 2023-07-08 DIAGNOSIS — R29709 NIHSS score 9: Secondary | ICD-10-CM | POA: Diagnosis not present

## 2023-07-08 DIAGNOSIS — I63521 Cerebral infarction due to unspecified occlusion or stenosis of right anterior cerebral artery: Principal | ICD-10-CM | POA: Diagnosis present

## 2023-07-08 DIAGNOSIS — Z79899 Other long term (current) drug therapy: Secondary | ICD-10-CM

## 2023-07-08 DIAGNOSIS — R471 Dysarthria and anarthria: Secondary | ICD-10-CM | POA: Diagnosis present

## 2023-07-08 DIAGNOSIS — N939 Abnormal uterine and vaginal bleeding, unspecified: Principal | ICD-10-CM

## 2023-07-08 DIAGNOSIS — Z6821 Body mass index (BMI) 21.0-21.9, adult: Secondary | ICD-10-CM

## 2023-07-08 DIAGNOSIS — G8194 Hemiplegia, unspecified affecting left nondominant side: Secondary | ICD-10-CM | POA: Diagnosis present

## 2023-07-08 DIAGNOSIS — E861 Hypovolemia: Secondary | ICD-10-CM | POA: Diagnosis present

## 2023-07-08 DIAGNOSIS — Z515 Encounter for palliative care: Secondary | ICD-10-CM

## 2023-07-08 DIAGNOSIS — I4891 Unspecified atrial fibrillation: Secondary | ICD-10-CM

## 2023-07-08 DIAGNOSIS — I5032 Chronic diastolic (congestive) heart failure: Secondary | ICD-10-CM | POA: Diagnosis present

## 2023-07-08 DIAGNOSIS — R29707 NIHSS score 7: Secondary | ICD-10-CM | POA: Diagnosis present

## 2023-07-08 DIAGNOSIS — Z8249 Family history of ischemic heart disease and other diseases of the circulatory system: Secondary | ICD-10-CM

## 2023-07-08 DIAGNOSIS — N189 Chronic kidney disease, unspecified: Secondary | ICD-10-CM | POA: Insufficient documentation

## 2023-07-08 DIAGNOSIS — E872 Acidosis, unspecified: Secondary | ICD-10-CM | POA: Diagnosis present

## 2023-07-08 DIAGNOSIS — I739 Peripheral vascular disease, unspecified: Secondary | ICD-10-CM | POA: Diagnosis present

## 2023-07-08 DIAGNOSIS — I959 Hypotension, unspecified: Secondary | ICD-10-CM | POA: Diagnosis present

## 2023-07-08 DIAGNOSIS — D6832 Hemorrhagic disorder due to extrinsic circulating anticoagulants: Secondary | ICD-10-CM | POA: Diagnosis present

## 2023-07-08 DIAGNOSIS — E785 Hyperlipidemia, unspecified: Secondary | ICD-10-CM | POA: Diagnosis present

## 2023-07-08 DIAGNOSIS — E440 Moderate protein-calorie malnutrition: Secondary | ICD-10-CM | POA: Diagnosis present

## 2023-07-08 DIAGNOSIS — I130 Hypertensive heart and chronic kidney disease with heart failure and stage 1 through stage 4 chronic kidney disease, or unspecified chronic kidney disease: Secondary | ICD-10-CM | POA: Diagnosis present

## 2023-07-08 DIAGNOSIS — N179 Acute kidney failure, unspecified: Secondary | ICD-10-CM | POA: Diagnosis present

## 2023-07-08 HISTORY — DX: Unspecified diastolic (congestive) heart failure: I50.30

## 2023-07-08 HISTORY — DX: Unspecified atrial fibrillation: I48.91

## 2023-07-08 LAB — CBC WITH DIFFERENTIAL/PLATELET
Abs Immature Granulocytes: 0.04 10*3/uL (ref 0.00–0.07)
Basophils Absolute: 0 10*3/uL (ref 0.0–0.1)
Basophils Relative: 0 %
Eosinophils Absolute: 0.1 10*3/uL (ref 0.0–0.5)
Eosinophils Relative: 1 %
HCT: 29 % — ABNORMAL LOW (ref 36.0–46.0)
Hemoglobin: 9.1 g/dL — ABNORMAL LOW (ref 12.0–15.0)
Immature Granulocytes: 0 %
Lymphocytes Relative: 9 %
Lymphs Abs: 1 10*3/uL (ref 0.7–4.0)
MCH: 31.1 pg (ref 26.0–34.0)
MCHC: 31.4 g/dL (ref 30.0–36.0)
MCV: 99 fL (ref 80.0–100.0)
Monocytes Absolute: 0.9 10*3/uL (ref 0.1–1.0)
Monocytes Relative: 8 %
Neutro Abs: 8.4 10*3/uL — ABNORMAL HIGH (ref 1.7–7.7)
Neutrophils Relative %: 82 %
Platelets: 208 10*3/uL (ref 150–400)
RBC: 2.93 MIL/uL — ABNORMAL LOW (ref 3.87–5.11)
RDW: 14.4 % (ref 11.5–15.5)
WBC: 10.3 10*3/uL (ref 4.0–10.5)
nRBC: 0 % (ref 0.0–0.2)

## 2023-07-08 LAB — TROPONIN I (HIGH SENSITIVITY): Troponin I (High Sensitivity): 15 ng/L (ref <18)

## 2023-07-08 LAB — PREPARE RBC (CROSSMATCH)

## 2023-07-08 LAB — BASIC METABOLIC PANEL
Anion gap: 14 (ref 5–15)
BUN: 45 mg/dL — ABNORMAL HIGH (ref 8–23)
CO2: 20 mmol/L — ABNORMAL LOW (ref 22–32)
Calcium: 9.9 mg/dL (ref 8.9–10.3)
Chloride: 102 mmol/L (ref 98–111)
Creatinine, Ser: 2.27 mg/dL — ABNORMAL HIGH (ref 0.44–1.00)
GFR, Estimated: 20 mL/min — ABNORMAL LOW (ref >60)
Glucose, Bld: 137 mg/dL — ABNORMAL HIGH (ref 70–99)
Potassium: 4.5 mmol/L (ref 3.5–5.1)
Sodium: 136 mmol/L (ref 135–145)

## 2023-07-08 LAB — HEMOGLOBIN AND HEMATOCRIT, BLOOD
HCT: 25.7 % — ABNORMAL LOW (ref 36.0–46.0)
Hemoglobin: 8.4 g/dL — ABNORMAL LOW (ref 12.0–15.0)

## 2023-07-08 LAB — MAGNESIUM: Magnesium: 2.2 mg/dL (ref 1.7–2.4)

## 2023-07-08 LAB — ABO/RH: ABO/RH(D): AB POS

## 2023-07-08 MED ORDER — PROGESTERONE MICRONIZED 100 MG PO CAPS
200.0000 mg | ORAL_CAPSULE | Freq: Every day | ORAL | Status: DC
  Administered 2023-07-08 – 2023-07-09 (×2): 200 mg via ORAL
  Filled 2023-07-08 (×3): qty 2

## 2023-07-08 MED ORDER — LACTATED RINGERS IV BOLUS
1000.0000 mL | Freq: Once | INTRAVENOUS | Status: AC
  Administered 2023-07-08: 1000 mL via INTRAVENOUS

## 2023-07-08 MED ORDER — SODIUM CHLORIDE 0.9 % IV SOLN
10.0000 mL/h | Freq: Once | INTRAVENOUS | Status: AC
  Administered 2023-07-08: 10 mL/h via INTRAVENOUS

## 2023-07-08 MED ORDER — DILTIAZEM HCL-DEXTROSE 125-5 MG/125ML-% IV SOLN (PREMIX)
5.0000 mg/h | INTRAVENOUS | Status: DC
  Administered 2023-07-08: 5 mg/h via INTRAVENOUS
  Filled 2023-07-08: qty 125

## 2023-07-08 MED ORDER — PROTHROMBIN COMPLEX CONC HUMAN 1000 UNITS IV KIT
3000.0000 [IU] | PACK | Status: AC
  Administered 2023-07-09: 3000 [IU] via INTRAVENOUS
  Filled 2023-07-08: qty 3000

## 2023-07-08 MED ORDER — DILTIAZEM HCL 25 MG/5ML IV SOLN: 5.0000 mg | Freq: Once | INTRAVENOUS | Status: DC

## 2023-07-08 MED ORDER — DILTIAZEM LOAD VIA INFUSION
5.0000 mg | Freq: Once | INTRAVENOUS | Status: AC
  Administered 2023-07-08: 5 mg via INTRAVENOUS

## 2023-07-08 NOTE — ED Notes (Signed)
Full linen change including brief and chucks. Patient found to have large clot expelled from vagina. Patient cleaned from the same. CB in reach. Visitor at the bedside.

## 2023-07-08 NOTE — ED Provider Notes (Signed)
[facility] Provider Note    Event Date/Time   First MD Initiated Contact with Patient 07/08/23 1758     (approximate)   History   Chief Complaint Vaginal Bleeding   HPI  Marissa Gilmore is a 89 y.o. female with past medical history of hypertension, hyperlipidemia, atrial fibrillation on Eliquis, and CHF who presents to the ED complaining of vaginal bleeding.  Patient reports that for the past 2 days she has been dealing with persistent vaginal bleeding and passing clots about the size of a golf ball.  She has had to change a pad about twice daily, denies any associated abdominal or pelvic pain.  She denies any history of similar bleeding, has not seen an OB/GYN recently.  She denies any chest pain or shortness of breath, has not had any palpitations.     Physical Exam   Triage Vital Signs: ED Triage Vitals  Encounter Vitals Group     BP 07/08/23 1525 114/68     Systolic BP Percentile --      Diastolic BP Percentile --      Pulse Rate 07/08/23 1525 97     Resp 07/08/23 1525 16     Temp 07/08/23 1525 98.4 F (36.9 C)     Temp Source 07/08/23 1525 Oral     SpO2 07/08/23 1525 93 %     Weight 07/08/23 1530 128 lb (58.1 kg)     Height 07/08/23 1530 5\' 4"  (1.626 m)     Head Circumference --      Peak Flow --      Pain Score 07/08/23 1524 0     Pain Loc --      Pain Education --      Exclude from Growth Chart --     Most recent vital signs: Vitals:   07/08/23 2315 07/08/23 2330  BP: (!) 106/44 104/72  Pulse: 81 90  Resp: (!) 21 (!) 26  Temp:    SpO2: 100% 100%    Constitutional: Alert and oriented. Eyes: Conjunctivae are normal. Head: Atraumatic. Nose: No congestion/rhinnorhea. Mouth/Throat: Mucous membranes are moist.  Cardiovascular: Tachycardic, irregularly irregular rhythm. Grossly normal heart sounds.  2+ radial pulses bilaterally. Respiratory: Normal respiratory effort.  No retractions. Lungs CTAB. Gastrointestinal: Soft and  nontender. No distention. Genitourinary: Pelvic exam with large amount of clotted blood, unable to visualize the cervix or alternative source of bleeding. Musculoskeletal: No lower extremity tenderness nor edema.  Neurologic:  Normal speech and language. No gross focal neurologic deficits are appreciated.    ED Results / Procedures / Treatments   Labs (all labs ordered are listed, but only abnormal results are displayed) Labs Reviewed  CBC WITH DIFFERENTIAL/PLATELET - Abnormal; Notable for the following components:      Result Value   RBC 2.93 (*)    Hemoglobin 9.1 (*)    HCT 29.0 (*)    Neutro Abs 8.4 (*)    All other components within normal limits  BASIC METABOLIC PANEL - Abnormal; Notable for the following components:   CO2 20 (*)    Glucose, Bld 137 (*)    BUN 45 (*)    Creatinine, Ser 2.27 (*)    GFR, Estimated 20 (*)    All other components within normal limits  HEMOGLOBIN AND HEMATOCRIT, BLOOD - Abnormal; Notable for the following components:   Hemoglobin 8.4 (*)    HCT 25.7 (*)    All other components within normal limits  MAGNESIUM  URINE  DRUG SCREEN, QUALITATIVE ([facility] ONLY)  PROTIME-INR  APTT  TYPE AND SCREEN  PREPARE RBC (CROSSMATCH)  ABO/RH  TROPONIN I (HIGH SENSITIVITY)     EKG  ED ECG REPORT I, Chesley Noon, the attending physician, personally viewed and interpreted this ECG.   Date: 07/08/2023  EKG Time: 18:31  Rate: 126  Rhythm: atrial fibrillation  Axis: Normal  Intervals:none  ST&T Change: None  RADIOLOGY CT head reviewed and interpreted by me with no hemorrhage or midline shift.  PROCEDURES:  Critical Care performed: Yes, see critical care procedure note(s)  .Critical Care  Performed by: Chesley Noon, MD Authorized by: Chesley Noon, MD   Critical care provider statement:    Critical care time (minutes):  30   Critical care time was exclusive of:  Separately billable procedures and treating other patients and teaching  time   Critical care was necessary to treat or prevent imminent or life-threatening deterioration of the following conditions:  Cardiac failure (Atrial fibrillation with RVR)   Critical care was time spent personally by me on the following activities:  Development of treatment plan with patient or surrogate, discussions with consultants, evaluation of patient's response to treatment, examination of patient, ordering and review of laboratory studies, ordering and review of radiographic studies, ordering and performing treatments and interventions, pulse oximetry, re-evaluation of patient's condition and review of old charts   I assumed direction of critical care for this patient from another provider in my specialty: no     Care discussed with: admitting provider      MEDICATIONS ORDERED IN ED: Medications  progesterone (PROMETRIUM) capsule 200 mg (200 mg Oral Given 07/08/23 2323)  Prothrombin Complex Conc Human IVPB 3,000 Units (has no administration in time range)  diltiazem (CARDIZEM) 1 mg/mL load via infusion 5 mg (5 mg Intravenous Bolus from Bag 07/08/23 1845)  0.9 %  sodium chloride infusion (10 mL/hr Intravenous New Bag/Given 07/08/23 2225)  lactated ringers bolus 1,000 mL (1,000 mLs Intravenous New Bag/Given 07/08/23 2114)     IMPRESSION / MDM / ASSESSMENT AND PLAN / ED COURSE  I reviewed the triage vital signs and the nursing notes.                              89 y.o. female with past medical history of hypertension, hyperlipidemia, atrial fibrillation on Eliquis, and CHF who presents to the ED complaining of 2 days of vaginal bleeding and passing clots about the size of a golf ball.  Patient's presentation is most consistent with acute presentation with potential threat to life or bodily function.  Differential diagnosis includes, but is not limited to, anemia, malignancy, uterine fibroids, endometriosis, electrolyte abnormality, AKI.  Patient nontoxic-appearing and in no acute  distress, vital signs are remarkable for tachycardia but otherwise reassuring.  Patient not initially tachycardic in triage, but once she was brought back to a room she now appears to be in atrial fibrillation with RVR.  Heart rates currently in the 140s, although patient does not seem to have any symptoms related to this.  She does have significant clotted blood on pelvic exam with no clear source.  Labs show downtrending hemoglobin from 13.95 months ago to 9.1 today, no indication for transfusion at this time but will send for type and screen.  We will further assess for source of bleeding with ultrasound, start patient on IV diltiazem for rate control.  Additional labs do show AKI but without acute electrolyte  abnormality.  ----------------------------------------- 8:00 PM on 07/08/2023 ----------------------------------------- I went to reassess the patient following initiation of diltiazem drip and after her ultrasound.  She appeared to have slurred speech and left-sided facial droop which was not present previously.  She was last seen by her nurse around 6:45 PM, code stroke activated.  She appears to have ongoing vaginal bleeding and if there has been significant drop in her hemoglobin, this could be contributing to stroke symptoms and we will transfuse one unit PRBCs.  Patient also with borderline low BP at this time and we will stop diltiazem drip to avoid hypotension in the setting of possible stroke.  Patient taken to CT scanner but given ongoing bleeding, she does not seem to be a TNK  candidate.  CT head is negative for acute process, patient evaluated by teleneurologist and is not a candidate for TNK.  Pros and cons of CT angiogram discussed with teleneurologist, however with relatively low suspicion for large vessel occlusion at this time we will hold off on CTA given her AKI and GFR of 20.  Patient remains slightly tachycardic in the 110s after stopping of diltiazem drip, did have borderline  low blood pressures now improved after initiation of blood transfusion.  Repeat H&H slightly downtrending, patient would benefit from additional repeat following completion of transfusion.  Pelvic ultrasound shows thickened endometrium and case discussed with midwife Andrey Campanile, who recommends oral progesterone to help with the bleeding.  We will also reverse her Eliquis with Kcentra given concern for worsening bleeding.  Case discussed with hospitalist for admission.      FINAL CLINICAL IMPRESSION(S) / ED DIAGNOSES   Final diagnoses:  Vaginal bleeding  Atrial fibrillation with RVR (HCC)  Cerebrovascular accident (CVA), unspecified mechanism (HCC)     Rx / DC Orders   ED Discharge Orders     None        Note:  This document was prepared using Dragon voice recognition software and may include unintentional dictation errors.   Chesley Noon, MD 07/08/23 [phone]

## 2023-07-08 NOTE — Progress Notes (Signed)
   07/08/23 2100  Spiritual Encounters  Type of Visit Initial  Care provided to: Pt and family  Referral source Code page  Reason for visit Code  OnCall Visit Yes   Chaplain responded to stroke and provided compassionate care to patient and family.  Chaplain spiritual support services remain available as the need arises.

## 2023-07-08 NOTE — Progress Notes (Signed)
1514 - patient arrived to ed for afibb.  1845 - lkwt per EDP  2014 - stroke alert activated for left sided weakness and left facial droop. Imaging already completed prior to [facility] on cart.   2022 - [facility] page sent.   2029 - [facility] on cart to assess patient.   2040- tsrn off cart. To make recommendations to edp.

## 2023-07-08 NOTE — ED Notes (Signed)
Blue top tube sent to lab. 

## 2023-07-08 NOTE — Consult Note (Signed)
[facility] Tele[facility]urology Consult Services   Patient Name:   Marissa Gilmore, Marissa Gilmore Date of Birth:   27-Feb-1931 Identification Number:   MRN - [id number] Date of Service:   07/08/2023 20:22:00  Diagnosis:       G45.9 - Transient cerebral ischemic attack, unspecified  Impression:      89 year old female who presents to the hospital because of atrial fibrillation. While in the ED she developed left side weakness and garbled speech. Symptoms have since improved and may represent TIA.  Our recommendations are outlined below.  Recommendations:        Stroke/Telemetry Floor       Neuro Checks       Bedside Swallow Eval       DVT Prophylaxis       IV Fluids, Normal Saline       Head of Bed 30 Degrees       Euglycemia and Avoid Hyperthermia (PRN Acetaminophen)       Hold Anticoagulation for Now       Initiate or continue Aspirin 81 MG daily       Initiate Clopidogrel 75 mg daily       Antihypertensives PRN if Blood pressure is greater than 220/120 or there is a concern for End organ damage/contraindications for permissive HTN. If blood pressure is greater than 220/120 give labetalol PO or IV or Vasotec IV with a goal of 15% reduction in BP during the first 24 hours.  Sign Out:       Discussed with Emergency Department Provider    ------------------------------------------------------------------------------  Advanced Imaging: Advanced Imaging Deferred because:  Poor functional status at baseline, a greater risk than benefit with acute intervention   Metrics: Last Known Well: 07/08/2023 18:45:00 [facility] Notification Time: 07/08/2023 20:22:00 Stamp Time: 07/08/2023 20:22:00 Initial Response Time: 07/08/2023 20:29:14 Symptoms: Left side weakness. . Initial patient interaction: 07/08/2023 20:32:19 NIHSS Assessment Completed: 07/08/2023 20:37:43 Patient is not a candidate for Thrombolytic. Thrombolytic Medical Decision: 07/08/2023 20:30:00 Patient was not  deemed candidate for Thrombolytic because of following reasons: Use of NOAs within 48 hours.  CT head showed no acute hemorrhage or acute core infarct.  Primary Provider Notified of Diagnostic Impression and Management Plan on: 07/08/2023 20:46:09    ------------------------------------------------------------------------------  History of Present Illness: Patient is a 89 year old Female.  89 year old female with a history of paroxysmal atrial fibrillation on Eliquis who presented to the ED earlier this morning with Afib with RVR. She was placed on a cardizem drip but then at 18:45 she became hypotensive and weak on the left side. By the time of the teleneuro evaluation patient's symptoms had improved.   Past Medical History:      Hypertension      Atrial Fibrillation  Medications:  Anticoagulant use:  Yes Eliquis No Antiplatelet use Reviewed EMR for current medications  Allergies:  Reviewed  Social History: Drug Use: No  Family History:  There is no family history of premature cerebrovascular disease pertinent to this consultation  ROS : 14 Points Review of Systems was performed and was negative except mentioned in HPI.  Past Surgical History: There Is No Surgical History Contributory To Today's Visit    Examination: BP(134/37), Pulse(90), 1A: Level of Consciousness - Alert; keenly responsive + 0 1B: Ask Month and Age - Both Questions Right + 0 1C: Blink Eyes & Squeeze Hands - Performs Both Tasks + 0 2: Test Horizontal Extraocular Movements - Normal + 0 3: Test Visual Fields - Partial  Hemianopia + 1 4: Test Facial Palsy (Use Grimace if Obtunded) - Normal symmetry + 0 5A: Test Left Arm Motor Drift - No Drift for 10 Seconds + 0 5B: Test Right Arm Motor Drift - No Drift for 10 Seconds + 0 6A: Test Left Leg Motor Drift - No Drift for 5 Seconds + 0 6B: Test Right Leg Motor Drift - No Drift for 5 Seconds + 0 7: Test Limb Ataxia (FNF/Heel-Shin) - No Ataxia + 0 8:  Test Sensation - Normal; No sensory loss + 0 9: Test Language/Aphasia - Normal; No aphasia + 0 10: Test Dysarthria - Mild-Moderate Dysarthria: Slurring but can be understood + 1 11: Test Extinction/Inattention - No abnormality + 0  NIHSS Score: 2   Pre-Morbid Modified Rankin Scale: 2 Points = Slight disability; unable to carry out all previous activities, but able to look after own affairs without assistance  Spoke with : Dr. Larinda Buttery  This consult was conducted in real time using interactive audio and Immunologist. Patient was informed of the technology being used for this visit and agreed to proceed. Patient located in hospital and provider located at home/office setting.   Patient is being evaluated for possible acute neurologic impairment and high probability of imminent or life-threatening deterioration. I spent total of 30 minutes providing care to this patient, including time for face to face visit via telemedicine, review of medical records, imaging studies and discussion of findings with providers, the patient and/or family.   Dr Joice Lofts   [facility] For Inpatient follow-up with [facility] physician please call [facility] [phone]. This is not an outpatient service. Post hospital discharge, please contact hospital directly.  Please do not communicate with [facility] physicians via secure chat. If you have any questions, Please contact [facility]. Please call or reconsult our service if there are any clinical or diagnostic changes.

## 2023-07-08 NOTE — ED Triage Notes (Signed)
Pt to ED for bright red vaginal bleeding since 2 days ago. A few clots, round, smaller than quarter. Pt takes eliquis. No pain.Pt recently started new cardiac med for rate control, has a fib. Unsure of name. Pt has good color, well appearing.

## 2023-07-08 NOTE — ED Notes (Signed)
CODE STROKE 

## 2023-07-09 ENCOUNTER — Encounter: Payer: Self-pay | Admitting: Internal Medicine

## 2023-07-09 ENCOUNTER — Inpatient Hospital Stay
Admit: 2023-07-09 | Discharge: 2023-07-09 | Disposition: A | Discharge status: Home / Self Care | Payer: Medicare Other | Attending: Internal Medicine | Admitting: Internal Medicine

## 2023-07-09 ENCOUNTER — Emergency Department: Payer: Medicare Other

## 2023-07-09 ENCOUNTER — Other Ambulatory Visit: Payer: Medicare Other

## 2023-07-09 DIAGNOSIS — N950 Postmenopausal bleeding: Secondary | ICD-10-CM

## 2023-07-09 DIAGNOSIS — I639 Cerebral infarction, unspecified: Secondary | ICD-10-CM | POA: Diagnosis not present

## 2023-07-09 DIAGNOSIS — G8194 Hemiplegia, unspecified affecting left nondominant side: Secondary | ICD-10-CM | POA: Diagnosis present

## 2023-07-09 DIAGNOSIS — I2699 Other pulmonary embolism without acute cor pulmonale: Secondary | ICD-10-CM | POA: Diagnosis not present

## 2023-07-09 DIAGNOSIS — I6350 Cerebral infarction due to unspecified occlusion or stenosis of unspecified cerebral artery: Secondary | ICD-10-CM | POA: Diagnosis not present

## 2023-07-09 DIAGNOSIS — I959 Hypotension, unspecified: Secondary | ICD-10-CM | POA: Diagnosis present

## 2023-07-09 DIAGNOSIS — I4819 Other persistent atrial fibrillation: Secondary | ICD-10-CM | POA: Diagnosis present

## 2023-07-09 DIAGNOSIS — N939 Abnormal uterine and vaginal bleeding, unspecified: Secondary | ICD-10-CM | POA: Diagnosis present

## 2023-07-09 DIAGNOSIS — I5032 Chronic diastolic (congestive) heart failure: Secondary | ICD-10-CM | POA: Diagnosis present

## 2023-07-09 DIAGNOSIS — T45515A Adverse effect of anticoagulants, initial encounter: Secondary | ICD-10-CM | POA: Diagnosis present

## 2023-07-09 DIAGNOSIS — I428 Other cardiomyopathies: Secondary | ICD-10-CM | POA: Diagnosis present

## 2023-07-09 DIAGNOSIS — I472 Ventricular tachycardia, unspecified: Secondary | ICD-10-CM | POA: Diagnosis present

## 2023-07-09 DIAGNOSIS — N179 Acute kidney failure, unspecified: Secondary | ICD-10-CM

## 2023-07-09 DIAGNOSIS — I130 Hypertensive heart and chronic kidney disease with heart failure and stage 1 through stage 4 chronic kidney disease, or unspecified chronic kidney disease: Secondary | ICD-10-CM | POA: Diagnosis present

## 2023-07-09 DIAGNOSIS — D696 Thrombocytopenia, unspecified: Secondary | ICD-10-CM | POA: Diagnosis not present

## 2023-07-09 DIAGNOSIS — I9589 Other hypotension: Secondary | ICD-10-CM | POA: Diagnosis not present

## 2023-07-09 DIAGNOSIS — I5022 Chronic systolic (congestive) heart failure: Secondary | ICD-10-CM | POA: Diagnosis not present

## 2023-07-09 DIAGNOSIS — I82412 Acute embolism and thrombosis of left femoral vein: Secondary | ICD-10-CM | POA: Diagnosis not present

## 2023-07-09 DIAGNOSIS — E870 Hyperosmolality and hypernatremia: Secondary | ICD-10-CM | POA: Diagnosis not present

## 2023-07-09 DIAGNOSIS — N1831 Chronic kidney disease, stage 3a: Secondary | ICD-10-CM | POA: Diagnosis present

## 2023-07-09 DIAGNOSIS — I4891 Unspecified atrial fibrillation: Secondary | ICD-10-CM | POA: Diagnosis not present

## 2023-07-09 DIAGNOSIS — D62 Acute posthemorrhagic anemia: Secondary | ICD-10-CM | POA: Diagnosis present

## 2023-07-09 DIAGNOSIS — I63521 Cerebral infarction due to unspecified occlusion or stenosis of right anterior cerebral artery: Secondary | ICD-10-CM | POA: Diagnosis present

## 2023-07-09 DIAGNOSIS — D6832 Hemorrhagic disorder due to extrinsic circulating anticoagulants: Secondary | ICD-10-CM | POA: Diagnosis present

## 2023-07-09 DIAGNOSIS — I739 Peripheral vascular disease, unspecified: Secondary | ICD-10-CM | POA: Diagnosis present

## 2023-07-09 DIAGNOSIS — E440 Moderate protein-calorie malnutrition: Secondary | ICD-10-CM | POA: Diagnosis present

## 2023-07-09 DIAGNOSIS — D519 Vitamin B12 deficiency anemia, unspecified: Secondary | ICD-10-CM | POA: Diagnosis present

## 2023-07-09 DIAGNOSIS — Z66 Do not resuscitate: Secondary | ICD-10-CM | POA: Diagnosis not present

## 2023-07-09 DIAGNOSIS — Z515 Encounter for palliative care: Secondary | ICD-10-CM | POA: Diagnosis not present

## 2023-07-09 DIAGNOSIS — E872 Acidosis, unspecified: Secondary | ICD-10-CM | POA: Diagnosis present

## 2023-07-09 LAB — URINE DRUG SCREEN, QUALITATIVE (ARMC ONLY)
Amphetamines, Ur Screen: NOT DETECTED
Barbiturates, Ur Screen: NOT DETECTED
Benzodiazepine, Ur Scrn: NOT DETECTED
Cannabinoid 50 Ng, Ur ~~LOC~~: NOT DETECTED
Cocaine Metabolite,Ur ~~LOC~~: NOT DETECTED
MDMA (Ecstasy)Ur Screen: NOT DETECTED
Methadone Scn, Ur: NOT DETECTED
Opiate, Ur Screen: NOT DETECTED
Phencyclidine (PCP) Ur S: NOT DETECTED
Tricyclic, Ur Screen: NOT DETECTED

## 2023-07-09 LAB — ECHOCARDIOGRAM COMPLETE
AR max vel: 1.64 cm2
AV Area VTI: 1.57 cm2
AV Area mean vel: 1.44 cm2
AV Mean grad: 5.7 mmHg
AV Peak grad: 11.9 mmHg
Ao pk vel: 1.73 m/s
Area-P 1/2: 5.62 cm2
Height: 64 in
MV VTI: 2.93 cm2
S' Lateral: 3.1 cm
Weight: 2048 [oz_av]

## 2023-07-09 LAB — PROTIME-INR
INR: 1.2 (ref 0.8–1.2)
Prothrombin Time: 15.3 s — ABNORMAL HIGH (ref 11.4–15.2)

## 2023-07-09 LAB — LIPID PANEL
Cholesterol: 132 mg/dL (ref 0–200)
HDL: 46 mg/dL (ref >40)
LDL Cholesterol: 69 mg/dL (ref 0–99)
Total CHOL/HDL Ratio: 2.9 ratio
Triglycerides: 86 mg/dL (ref <150)
VLDL: 17 mg/dL (ref 0–40)

## 2023-07-09 LAB — IRON AND TIBC
Iron: 243 ug/dL — ABNORMAL HIGH (ref 28–170)
Saturation Ratios: 83 % — ABNORMAL HIGH (ref 10.4–31.8)
TIBC: 293 ug/dL (ref 250–450)
UIBC: 50 ug/dL

## 2023-07-09 LAB — FERRITIN: Ferritin: 74 ng/mL (ref 11–307)

## 2023-07-09 LAB — APTT: aPTT: 28 s (ref 24–36)

## 2023-07-09 LAB — HEMOGLOBIN
Hemoglobin: 9.3 g/dL — ABNORMAL LOW (ref 12.0–15.0)
Hemoglobin: 8.8 g/dL — ABNORMAL LOW (ref 12.0–15.0)

## 2023-07-09 MED ORDER — ASPIRIN 81 MG PO TBEC
81.0000 mg | DELAYED_RELEASE_TABLET | Freq: Every day | ORAL | Status: DC
  Administered 2023-07-11 – 2023-07-15 (×5): 81 mg via ORAL
  Filled 2023-07-09 (×6): qty 1

## 2023-07-09 MED ORDER — SODIUM CHLORIDE 0.9 % IV BOLUS: 500.0000 mL | Freq: Once | INTRAVENOUS | Status: DC

## 2023-07-09 MED ORDER — ASPIRIN 325 MG PO TABS
325.0000 mg | ORAL_TABLET | Freq: Once | ORAL | Status: AC
  Administered 2023-07-09: 325 mg via ORAL
  Filled 2023-07-09: qty 1

## 2023-07-09 MED ORDER — ACETAMINOPHEN 650 MG RE SUPP: 650.0000 mg | RECTAL | Status: DC | PRN

## 2023-07-09 MED ORDER — SODIUM CHLORIDE 0.9 % IV SOLN: INTRAVENOUS | Status: DC

## 2023-07-09 MED ORDER — ACETAMINOPHEN 160 MG/5ML PO SOLN: 650.0000 mg | ORAL | Status: DC | PRN

## 2023-07-09 MED ORDER — SODIUM CHLORIDE 0.9 % IV BOLUS
500.0000 mL | Freq: Once | INTRAVENOUS | Status: AC
  Administered 2023-07-09: 500 mL via INTRAVENOUS

## 2023-07-09 MED ORDER — ATORVASTATIN CALCIUM 20 MG PO TABS
40.0000 mg | ORAL_TABLET | Freq: Every day | ORAL | Status: DC
  Administered 2023-07-09 – 2023-07-16 (×7): 40 mg via ORAL
  Filled 2023-07-09 (×8): qty 2

## 2023-07-09 MED ORDER — DIGOXIN 0.25 MG/ML IJ SOLN
0.2500 mg | Freq: Once | INTRAMUSCULAR | Status: AC
  Administered 2023-07-09: 0.25 mg via INTRAVENOUS
  Filled 2023-07-09: qty 2

## 2023-07-09 MED ORDER — ACETAMINOPHEN 325 MG PO TABS: 650.0000 mg | ORAL_TABLET | ORAL | Status: DC | PRN

## 2023-07-09 MED ORDER — STROKE: EARLY STAGES OF RECOVERY BOOK: Freq: Once | Status: AC

## 2023-07-09 MED ORDER — METOPROLOL TARTRATE 5 MG/5ML IV SOLN
2.5000 mg | Freq: Four times a day (QID) | INTRAVENOUS | Status: DC | PRN
  Administered 2023-07-09 – 2023-07-10 (×2): 2.5 mg via INTRAVENOUS
  Filled 2023-07-09 (×3): qty 5

## 2023-07-09 MED ORDER — DILTIAZEM HCL 30 MG PO TABS
30.0000 mg | ORAL_TABLET | Freq: Three times a day (TID) | ORAL | Status: DC
  Administered 2023-07-09 – 2023-07-17 (×21): 30 mg via ORAL
  Filled 2023-07-09 (×26): qty 1

## 2023-07-09 NOTE — Progress Notes (Signed)
Called to pt room by [facility], pt had multiple medium sized blood clots and blood. Pt cleaned and bed sheets changed. Will continue to monitor. Heart rate remains irregular.

## 2023-07-09 NOTE — Assessment & Plan Note (Deleted)
Creatinine 2.27 likely related to hypovolemia from blood loss IV hydration and monitor.

## 2023-07-09 NOTE — Progress Notes (Addendum)
PT Cancellation Note  Patient Details Name: Marissa Gilmore MRN: [id number] DOB: 05-21-1931   Cancelled Treatment:    Reason Eval/Treat Not Completed: Other (comment) Pt experiencing elevated and fluctuating HR at rest (peak observed 165bpm on telemetry outside of room). PT to hold at this time for further medical workup and re-attempt as appropriate.    Zahli Vetsch, PT, SPT 11:24 AM,07/09/23

## 2023-07-09 NOTE — Assessment & Plan Note (Addendum)
Patient had a stroke on 9/4 and again on 9/12.  Patient has left-sided weakness and difficulty swallowing.  The patient did not want a feeding tube.  Confirmed DNR/DNI status.  Hospice referral was made and patient accepted to the hospice home.  Declined aspirin at this point. Not a candidate for major anticoagulation secondary to vaginal bleeding.

## 2023-07-09 NOTE — Progress Notes (Signed)
*  PRELIMINARY RESULTS* Echocardiogram 2D Echocardiogram has been performed.  Marissa Gilmore 07/09/2023, 11:08 AM

## 2023-07-09 NOTE — Assessment & Plan Note (Addendum)
Atrial fibrillation with rapid ventricular response.  On metoprolol and Cardizem.  Unable to give Eliquis at this point.  Patient refused metoprolol and Cardizem so we will stop these medications also.

## 2023-07-09 NOTE — ED Notes (Signed)
Pt and family member both asleep, vitals WNL.

## 2023-07-09 NOTE — ED Notes (Signed)
Pt voicing need to urinate. Pt placed on bedpan. Pt cleaned of blood, pulling large clot, golf ball size, when cleaning. Pt gown and sheets changed, new brief and chuck pad placed.

## 2023-07-09 NOTE — Progress Notes (Addendum)
Progress Note   Patient: Marissa Gilmore RUE:[id number] DOB: Aug 28, 1931 DOA: 07/08/2023     0 DOS: the patient was seen and examined on 07/09/2023   Brief hospital course: Marissa Gilmore is a 89 y.o. female with medical history significant for Chronic atrial fibrillation on Eliquis and amiodarone, systolic CHF, HTN and HLD who presented to the ED initially with a 2-day history of persistent vaginal bleeding including passage of golf ball size clots.  While in the emergency room, patient developed atrial fibrillation with RVR, then subsequently she developed left-sided facial droop and slurred speech. MRI brain showed subtle 1.5 cm focus of diffusion signal abnormality involving the right frontal corona radiata, suspicious for an acute ischemic infarct. MRA head: 1. Acute occlusion of a proximal right M2 branch, superior division. 2. Additional moderate proximal right M2 stenosis, inferior division. 3. Severe distal right P2 stenosis, with additional moderate left P2 stenosis. 4. Focal severe right A1 stenosis. Patient is seen by neurology, due to large amount of vaginal bleeding, anticoagulation was not able to start.   Principal Problem:   Acute CVA (cerebrovascular accident) (HCC) Active Problems:   ABLA (acute blood loss anemia)   Hypotension   AKI (acute kidney injury) (HCC)   Chronic systolic (congestive) heart failure (HCC)   Chronic anticoagulation   Post-menopausal bleeding   Atrial fibrillation with rapid ventricular response (HCC)   Acute arterial ischemic stroke, multifocal, anterior circulation, right (HCC)   Assessment and Plan: * Acute CVA (cerebrovascular accident) [name]) Patient had a single stroke in the right ACA distribution.  Given multiple intracranial arterial obstructions, this most likely ischemic stroke.  However, due to large vaginal bleeding, not able to start antiplatelet antiplatelets. Continue statin.  ABLA (acute blood loss anemia) Postmenopausal  bleeding Being transfused in the ED Serial H&H thereafter and transfuse as necessary GYN consult to follow Continue progesterone 200 mg daily as recommended by GYN Per Dr Schermerhorn:Not much I can do for her at this except to keep  her on progesterone and for her to follow up with me as an outpatient to talk about evaluation of a thickened stripe and PMB   Continue to follow hemoglobin and transfuse as needed.  AKI (acute kidney injury) on chronic kidney disease stage IIIa.   Mild metabolic acidosis. Creatinine 2.27 likely related to hypovolemia from blood loss IV hydration and monitor.  Atrial fibrillation with rapid ventricular response (HCC) Hypotension. Not able to start anticoagulation due to large vaginal bleeding. Blood pressure seem to be better after initial fluids.  Continue fluids been Patient heart rate still fast, received dose of digoxin, not planning for continued use due to renal function.  I will also add lower dose of diltiazem. As needed metoprolol.  Chronic systolic (congestive) heart failure (HCC) EF 30% 2021 Clinically euvolemic Holding torsemide, spironolactone, Entresto, metoprolol to allow for permissive hypertension  Nonsustained VT. Patient had 5 beats of VT, will monitor closely, correct electrolytes abnormalities.   Patient condition is critical, high risk of deterioration, need close follow up.    Subjective:  Patient denies any short of breath, still has some vaginal bleeding, per RN, it is slowing down significantly.  Physical Exam: Vitals:   07/09/23 1330 07/09/23 1400 07/09/23 1415 07/09/23 1445  BP: 122/68 112/61 (!) 130/94 (!) 110/42  Pulse: 62   (!) 110  Resp: (!) 25 (!) 23 20 (!) 24  Temp:      TempSrc:      SpO2: 96%     Weight:  Height:       General exam: Appears calm and comfortable  Respiratory system: Clear to auscultation. Respiratory effort normal. Cardiovascular system: Irregular, tachycardic. No JVD, murmurs, rubs,  gallops or clicks. No pedal edema. Gastrointestinal system: Abdomen is nondistended, soft and nontender. No organomegaly or masses felt. Normal bowel sounds heard. Central nervous system: Alert and oriented x3. No focal neurological deficits. Extremities: Symmetric 5 x 5 power. Skin: No rashes, lesions or ulcers Psychiatry:Mood & affect appropriate.    Data Reviewed:  Reviewed lab results.  Family Communication: Updated friend listed as a contact person.  No family member listed.  Disposition: Status is: Inpatient Remains inpatient appropriate because: Severity of disease, IV treatment.     Time spent: 50 minutes  Author: Marrion Coy, MD 07/09/2023 3:01 PM  For on call review [email or web address].

## 2023-07-09 NOTE — H&P (Addendum)
History and Physical    Patient: Marissa Gilmore DGU:[id number] DOB: 1931-01-06 DOA: 07/08/2023 DOS: the patient was seen and examined on 07/09/2023 PCP: Erasmo Downer, MD  Patient coming from: Home  Chief Complaint:  Chief Complaint  Patient presents with   Vaginal Bleeding    HPI: Marissa Gilmore is a 89 y.o. female with medical history significant for Chronic atrial fibrillation on Eliquis and amiodarone, systolic CHF, HTN and HLD who presented to the ED initially with a 2-day history of persistent vaginal bleeding including passage of golf ball size clots.  She has been using pads which are becoming soaked at least twice a day.  She has had no prior symptoms since menopause and has not seen an OB/GYN of late.  She denies chest pain or shortness of breath or lightheadedness.  Initial ED course and data review: Initial BP 114/68 and pulse 97 Labs: Hemoglobin 9.1> 8.4, creatinine 2.27 up from 1.5 about 5 months prior.  Troponin 15.  Magnesium and potassium normal. EKG showed A-fib at 126 Pelvic ultrasound showed a thickened endometrium up to 24 mm dictation. OB/GYN was consulted and recommended progesterone 200 mg daily which was started  Rapid A-fib then code stroke event: During workup in the ED she went into rapid A-fib and was started on IV diltiazem.  Subsequent to initiation of diltiazem she was noted to have a new 2 left-sided facial droop and slurred speech that was not present upon her arrival.  BP was noted to be low and the diltiazem drip was discontinued code stroke was initiated. CT head was negative for acute process and patient was evaluated by the teleneurologist and was not deemed to be a candidate for TNK.  Due to low suspicion for LVO and current AKI seen on workup CTA was not recommended by teleneurologist. MRI and MRA pending  Downtrending H&H and vaginal bleeding treatment in the ED: H&H trended down 9.1>8.4 and transfusion of 1 unit PRBC was initiated.   Patient continued to have blood soaked undergarments in the ED with need for frequent changes.  She remains tachycardic.  Marland Kitchen After discussion with ED provider, decision made to administer Kcentra for Eliquis reversal  Hospitalist admission requested. At the time of my evaluation, patient is awake, speech somewhat unclear, left facial droop.  Friend at bedside contributes to history.  Patient denies abdominal pain, nausea or vomiting, fever or chills.  Review of Systems: As mentioned in the history of present illness. All other systems reviewed and are negative.  Past Medical History:  Diagnosis Date   Atrial fibrillation (HCC)    Cataract    Diastolic heart failure (HCC)    35% EF   Hypertension    Past Surgical History:  Procedure Laterality Date   CATARACT EXTRACTION Right    Social History:  reports that she has never smoked. She has never used smokeless tobacco. She reports that she does not drink alcohol and does not use drugs.  No Known Allergies  Family History  Problem Relation Age of Onset   Heart disease Mother        no MI   Peripheral Artery Disease Mother 53   Bladder Cancer Father 80    Prior to Admission medications   Medication Sig Start Date End Date Taking? Authorizing Provider  amiodarone (PACERONE) 200 MG tablet Take 200 mg by mouth as directed. Take 1 tablet (200 mg total) by mouth as directed Take 1 tablet twice daily for 2 weeks. Thereafter take 1  tablet daily. 06/24/23  Yes [provider]  atorvastatin (LIPITOR) 40 MG tablet Take 1 tablet (40 mg total) by mouth daily. 02/06/23  Yes Bacigalupo, Marzella Schlein, MD  ELIQUIS 2.5 MG TABS tablet SMARTSIG:1 Tablet(s) By Mouth Every 12 Hours   Yes [provider]  metoprolol tartrate (LOPRESSOR) 25 MG tablet Take 25 mg by mouth 2 (two) times daily.  04/01/19  Yes [provider]  potassium chloride (KLOR-CON) 20 MEQ packet Take 20 mEq by mouth daily. 03/30/21  Yes [provider]   sacubitril-valsartan (ENTRESTO) 24-26 MG Take 1 tablet by mouth 2 (two) times daily.  05/25/19  Yes [provider]  spironolactone (ALDACTONE) 25 MG tablet Take 12.5 mg by mouth daily.   Yes [provider]  torsemide (DEMADEX) 20 MG tablet Take 20 mg by mouth 2 (two) times daily. 06/01/21  Yes [provider]  furosemide (LASIX) 40 MG tablet TAKE 1 TABLET BY MOUTH EVERY DAY Patient not taking: Reported on 07/09/2023 12/04/20   Erasmo Downer, MD    Physical Exam: Vitals:   07/08/23 2300 07/08/23 2315 07/08/23 2330 07/09/23 0047  BP: (!) 110/54 (!) 106/44 104/72 (!) 120/47  Pulse: 80 81 90 (!) 134  Resp: 19 (!) 21 (!) 26 (!) 22  Temp:    98.1 F (36.7 C)  TempSrc:    Oral  SpO2: 100% 100% 100% 100%  Weight:      Height:       Physical Exam Vitals and nursing note reviewed.  Constitutional:      General: She is not in acute distress. HENT:     Head: Normocephalic and atraumatic.  Cardiovascular:     Rate and Rhythm: Normal rate and regular rhythm.     Heart sounds: Normal heart sounds.  Pulmonary:     Effort: Pulmonary effort is normal.     Breath sounds: Normal breath sounds.  Abdominal:     Palpations: Abdomen is soft.     Tenderness: There is no abdominal tenderness.  Neurological:     Mental Status: Mental status is at baseline.     Cranial Nerves: Dysarthria and facial asymmetry present.     Motor: No weakness.     Comments: Left facial droop Limb strength equal bilaterally     Labs on Admission: I have personally reviewed following labs and imaging studies  CBC: Recent Labs  Lab 07/08/23 1531 07/08/23 1955  WBC 10.3  --   NEUTROABS 8.4*  --   HGB 9.1* 8.4*  HCT 29.0* 25.7*  MCV 99.0  --   PLT 208  --    Basic Metabolic Panel: Recent Labs  Lab 07/08/23 1531  NA 136  K 4.5  CL 102  CO2 20*  GLUCOSE 137*  BUN 45*  CREATININE 2.27*  CALCIUM 9.9  MG 2.2   GFR: Estimated Creatinine Clearance: 14.2 mL/min (A) (by  C-G formula based on SCr of 2.27 mg/dL (H)). Liver Function Tests: No results for input(s): "AST", "ALT", "ALKPHOS", "BILITOT", "PROT", "ALBUMIN" in the last 168 hours. No results for input(s): "LIPASE", "AMYLASE" in the last 168 hours. No results for input(s): "AMMONIA" in the last 168 hours. Coagulation Profile: No results for input(s): "INR", "PROTIME" in the last 168 hours. Cardiac Enzymes: No results for input(s): "CKTOTAL", "CKMB", "CKMBINDEX", "TROPONINI" in the last 168 hours. BNP (last 3 results) No results for input(s): "PROBNP" in the last 8760 hours. HbA1C: No results for input(s): "HGBA1C" in the last 72 hours. CBG: No  results for input(s): "GLUCAP" in the last 168 hours. Lipid Profile: No results for input(s): "CHOL", "HDL", "LDLCALC", "TRIG", "CHOLHDL", "LDLDIRECT" in the last 72 hours. Thyroid Function Tests: No results for input(s): "TSH", "T4TOTAL", "FREET4", "T3FREE", "THYROIDAB" in the last 72 hours. Anemia Panel: No results for input(s): "VITAMINB12", "FOLATE", "FERRITIN", "TIBC", "IRON", "RETICCTPCT" in the last 72 hours. Urine analysis:    Component Value Date/Time   COLORURINE STRAW (A) 07/21/2017 1952   APPEARANCEUR CLEAR (A) 07/21/2017 1952   LABSPEC 1.006 07/21/2017 1952   PHURINE 7.0 07/21/2017 1952   GLUCOSEU NEGATIVE 07/21/2017 1952   HGBUR NEGATIVE 07/21/2017 1952   BILIRUBINUR NEGATIVE 07/21/2017 1952   KETONESUR NEGATIVE 07/21/2017 1952   PROTEINUR NEGATIVE 07/21/2017 1952   NITRITE NEGATIVE 07/21/2017 1952   LEUKOCYTESUR MODERATE (A) 07/21/2017 1952    Radiological Exams on Admission: US PELVIC COMPLETE WITH TRANSVAGINAL  Result Date: 07/08/2023 CLINICAL DATA:  Vaginal bleeding in a 89 year old female EXAM: TRANSABDOMINAL ULTRASOUND OF PELVIS TECHNIQUE: Transabdominal ultrasound examination of the pelvis was performed including evaluation of the uterus, ovaries, adnexal regions, and pelvic cul-de-sac. Patient declined transvaginal exam.  COMPARISON:  None Available. FINDINGS: Uterus Measurements: 50.3 x 6.6 x 8.6 cm = volume: 452 mL. Enlarged heterogenous uterus with multiple fibroids measuring up to 7.8 cm. Endometrium Thickness: 24 mm. Thickened heterogenous endometrium without definite focal lesion. Right ovary Not visualized due to enlarged heterogenous uterus and overlying bowel. Left ovary Not visualized due to enlarged heterogenous uterus and overlying. Other findings:  No abnormal free fluid. IMPRESSION: 1. Thickened heterogenous endometrium measuring up to 24 mm. In the setting of post-menopausal bleeding, endometrial sampling is indicated to exclude carcinoma. If results are benign, sonohysterogram should be considered for focal lesion work-up. (Ref: Radiological Reasoning: Algorithmic Workup of Abnormal Vaginal Bleeding with Endovaginal Sonography and Sonohysterography. AJR 2008; 161:W96-04) 2. Enlarged heterogenous uterus with multiple fibroids. 3. Nonvisualization of the ovaries. Electronically Signed   By: Minerva Fester M.D.   On: 07/08/2023 20:49   CT HEAD CODE STROKE WO CONTRAST  Result Date: 07/08/2023 CLINICAL DATA:  Code stroke.  Neuro deficit, acute, stroke suspected EXAM: CT HEAD WITHOUT CONTRAST TECHNIQUE: Contiguous axial images were obtained from the base of the skull through the vertex without intravenous contrast. RADIATION DOSE REDUCTION: This exam was performed according to the departmental dose-optimization program which includes automated exposure control, adjustment of the mA and/or kV according to patient size and/or use of iterative reconstruction technique. COMPARISON:  CT head 07/21/2017. FINDINGS: Brain: No evidence of acute infarction, hemorrhage, hydrocephalus, extra-axial collection or mass lesion/mass effect. Vascular: No hyperdense vessel or unexpected calcification. Skull: Normal. Negative for fracture or focal lesion. Sinuses/Orbits: No acute finding. Other: None. ASPECTS [facility] Stroke Program Early  CT Score) Total score (0-10 with 10 being normal): 10. IMPRESSION: 1. No evidence of acute intracranial abnormality. 2. ASPECTS is 10. Code stroke imaging results were communicated on 07/08/2023 at 8:12 pm to provider Chesley Noon via telephone, who verbally acknowledged these results. Electronically Signed   By: Feliberto Harts M.D.   On: 07/08/2023 20:13     Data Reviewed: Relevant notes from primary care and specialist visits, past discharge summaries as available in EHR, including Care Everywhere. Prior diagnostic testing as pertinent to current admission diagnoses Updated medications and problem lists for reconciliation ED course, including vitals, labs, imaging, treatment and response to treatment Triage notes, nursing and pharmacy notes and ED provider's notes Notable results as noted in HPI   Assessment and Plan: * Acute CVA (cerebrovascular  accident) [facility]) Patient developed left sided facial droop and dysarthria while in the ED   Code stroke >initial CT head negative> Seen by teleneurology ->recommended no tPA, no advanced imaging Will give IV fluids to boost blood pressure and monitor for fluid overload Follow-up MRI/MRA--->acute infarct with several areas of stenosis --see addendum below Teleneurology recommendations:       Stroke/Telemetry Floor       Neuro Checks       Bedside Swallow Eval       DVT Prophylaxis       IV Fluids, Normal Saline       Head of Bed 30 Degrees       Euglycemia and Avoid Hyperthermia (PRN Acetaminophen)       Hold Anticoagulation for Now       Initiate or continue Aspirin 81 MG daily       Initiate Clopidogrel 75 mg daily---(holding due to bleeding)       Antihypertensives PRN if Blood pressure is greater than 220/120 or there is a concern for End organ damage/contraindications for permissive HTN. If blood pressure is greater than 220/120 give labetalol PO or IV or Vasotec IV with a goal of 15% reduction in BP during the first 24  hours.  Addendum: MRIMRI HEAD IMPRESSION:   1. Subtle 1.5 cm focus of diffusion signal abnormality involving the right frontal corona radiata, suspicious for an acute ischemic infarct. No associated hemorrhage or mass effect. 2. 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   MRA HEAD IMPRESSION:   1. Acute occlusion of a proximal right M2 branch, superior division. 2. Additional moderate proximal right M2 stenosis, inferior division. 3. Severe distal right P2 stenosis, with additional moderate left P2 stenosis. 4. Focal severe right A1 stenosis.  ABLA (acute blood loss anemia) Postmenopausal bleeding Being transfused in the ED Serial H&H thereafter and transfuse as necessary GYN consult to follow Continue progesterone 200 mg daily as recommended by GYN  AKI (acute kidney injury) (HCC) Creatinine 2.27 likely related to hypovolemia from blood loss IV hydration and monitor.  Hypotension Patient developed hypotension along with strokelike symptoms, likely related to combination of acute blood loss anemia, diltiazem infusion Receiving blood, in addition to crystalloid   monitoring for fluid overload in view of systolic heart failure  Atrial fibrillation with rapid ventricular response (HCC) Patient currently on amiodarone with RVR in the ED treated with diltiazem Developed stroke symptoms while on diltiazem which was subsequently discontinued Eliquis reversed with Kcentra due to acute blood loss anemia from heavy vaginal bleeding Patient with intermittently soft blood pressures so will hold metoprolol and tonight's dose of amiodarone  Chronic systolic (congestive) heart failure (HCC) EF 30% 2021 Clinically euvolemic Holding torsemide, spironolactone, Entresto, metoprolol to allow for permissive hypertension    DVT prophylaxis: SCD  Consults: neurology, Dr Otelia Limes, Gyn  Advance Care Planning:  cfull code Family Communication: friend at bedside  Disposition Plan: Back to previous home environment  Severity of Illness: The appropriate patient status for this patient is INPATIENT. Inpatient status is judged to be reasonable and necessary in order to provide the required intensity of service to ensure the patient's safety. The patient's presenting symptoms, physical exam findings, and initial radiographic and laboratory data in the context of their chronic comorbidities is felt to place them at high risk for further clinical deterioration. Furthermore, it is not anticipated that the patient will be medically stable for discharge from the hospital within  2 midnights of admission.   * I certify that at the point of admission it is my clinical judgment that the patient will require inpatient hospital care spanning beyond 2 midnights from the point of admission due to high intensity of service, high risk for further deterioration and high frequency of surveillance required.* CRITICAL CARE Performed by: Andris Baumann   Total critical care time: 70 minutes  Critical care time was exclusive of separately billable procedures and treating other patients.  Critical care was necessary to treat or prevent imminent or life-threatening deterioration.  Critical care was time spent personally by me on the following activities: development of treatment plan with patient and/or surrogate as well as nursing, discussions with consultants, evaluation of patient's response to treatment, examination of patient, obtaining history from patient or surrogate, ordering and performing treatments and interventions, ordering and review of laboratory studies, ordering and review of radiographic studies, pulse oximetry and re-evaluation of patient's condition.  Author: Andris Baumann, MD 07/09/2023 1:27 AM  For on call review [email or web address].

## 2023-07-09 NOTE — ED Notes (Signed)
Echo at bedside

## 2023-07-09 NOTE — Evaluation (Signed)
Clinical/Bedside Swallow Evaluation Patient Details  Name: Marissa Gilmore MRN: [id number] Date of Birth: Dec 10, 1930  Today's Date: 07/09/2023 Time: SLP Start Time (ACUTE ONLY): 1335 SLP Stop Time (ACUTE ONLY): 1435 SLP Time Calculation (min) (ACUTE ONLY): 60 min  Past Medical History:  Past Medical History:  Diagnosis Date   Atrial fibrillation (HCC)    Cataract    Diastolic heart failure (HCC)    35% EF   Hypertension    Past Surgical History:  Past Surgical History:  Procedure Laterality Date   CATARACT EXTRACTION Right    HPI:  Marissa Gilmore is a 89 year old female with a history of CHF, HTN, paroxysmal atrial fibrillation on Eliquis who presented to the ED this morning with Afib with RVR and reports of bleeding.  Patient reports that for the past 2 days she has been dealing with persistent vaginal bleeding and passing clots about the size of a golf ball. She was placed on a cardizem drip but then at 18:45 she became hypotensive and weak on the Left side. Neurology has consulted Marissa Gilmore and assessed Left sided weakness w/ improvement of her s/s since initial complaint.  Marissa Gilmore and friend stated the s/s "come and go" -- MD and NSG were informed of this and the presence of Left OM weakenss w/ Dysarthria and Dysphagia at the time of this BSE/SLE (1400 on 07/09/2023).   Imaging today: MRI HEAD IMPRESSION:     1. Subtle 1.5 cm focus of diffusion signal abnormality involving the  right frontal corona radiata, suspicious for an acute ischemic  infarct. No associated hemorrhage or mass effect.  2. Underlying minor chronic microvascular ischemic disease with tiny  remote right occipital and left cerebellar infarcts.     MRA HEAD IMPRESSION:     1. Acute occlusion of a proximal right M2 branch, superior division.  2. Additional moderate proximal right M2 stenosis, inferior  division.  3. Severe distal right P2 stenosis, with additional moderate left P2  stenosis.  4. Focal severe right A1 stenosis.    Assessment /  Plan / Recommendation  Clinical Impression   Marissa Gilmore seen today for BSE and informal assessment of speech-language skills/abilities at bedside. Marissa Gilmore awake, verbal w/ MODERATE-SEVERE Dysarthria noted. A/O; followed general instructions. Friend present in room. Marissa Gilmore lives alone w/ support of friends for grocery needs. She is involved in her church/choir.  On RA, afebrile. WBC WNL.   Marissa Gilmore appears to present w/ functional pharyngeal phase swallowing w/ consistencies assessed at this evaluation; MODERATE+ oral phase dysphagia assessed. Suspect sensorimotor deficits in setting of acute infarct(See Imaging of MRI/MRA). Marissa Gilmore consumed po trials w/ No immediate, overt clinical s/s of aspiration during po trials, however, the impact of the oral phase dysphagia can increase risk for pharyngeal phase deficits to occur.  Marissa Gilmore appears at reduced risk for aspiration when using a modified diet consistency and following aspiration precautions w/ Supervision during po intake.  The impact of the new CVA, fatigue/weakness, and advanced age are factors that can increase risk for aspiration, dysphagia as well as decreased oral intake overall.   During po trials, Marissa Gilmore consumed all consistencies w/ no overt coughing, decline in vocal quality, or change in respiratory presentation during/post trials. O2 sats remained 97-98%. Noted min elevated HR to 125. Oral phase was c/b MODERATE+ oral motor weakness impacting timely bolus management, mastication, and control of bolus propulsion for A-P transfer for swallowing. Oral residue noted in Left buccal area w/ increased need/cues for lingual sweeping to clear. Noted consistent  Left anterior leakage of thin liquids via Cup -- NO Straw use recommended at this time. Oral clearing was achieved w/ all trial consistencies w/ Time and lingual sweeping; f/u dry swallow to ensure clearing.  OM Exam revealed Left unilateral labial and lingual weakness; lingual deviation w/ decreased labial seal. Reduced Left oral  sensation noted: CN 5, 7, 12. Speech c/b Dysarthria. Marissa Gilmore fed self w/ setup support.  Recommend a Minced consistency diet w/ well-moistened foods; Thin liquids via CUP -- carefully monitor and Marissa Gilmore should help Hold Cup when drinking. Recommend strict aspiration precautions, tray setup and sitting up support. Cues for Left oral clearing using lingual sweeping. Pills CRUSHED in Puree for safer, easier swallowing recommended. Reduce distractions/talking during eating/drinking.  Education given on Pills in Puree; food consistencies; aspiration precautions; need for support to Marissa Gilmore and Team. ST services will continue to follow during this admit for education and dysphagia tx. MD/NSG updated at the time of this eval via secure chat. Recommend Dietician f/u for support.   Marissa Gilmore was also seen for informal SLE at beside. Assessment completed via informal means. Marissa Gilmore alert and oriented x3. She followed 1-2 step commands w/ accuracy though appeared fatigued. She exhibited insight into CLOF and the changes in her speech intelligibility.  Marissa Gilmore presents with s/s of MODERATE-SEVERE Dysarthria c/b short rushes of speech with Marissa Gilmore tending to speak on residual volume, articulatory imprecision, and hypophonic vocal quality. Marissa Gilmore's speech judged to be 25-50% intelligible to an unfamiliar listener; some short phrases were <25% intelligible. W/ Repetition and increased effort, she was able to increase intelligibility.  No overt anomia noted during conversational exchanges or confrontation naming task; however, reduced details in elaboration of utterances was noted -- suspect the impact of fatigue w/ tasks in current setting of fluctuating Neurological status. She answered basic yes/no questions and indicated food/drink preferences and how to prepare iced tea("w/ sugar"). Any further assessment of higher functioning executive skills could be had post some improvement of her Dysarthria in order that she could better engage verbally.   Discussed  and recommended Dysarthria exs targeting Over-articulation: Articulatory strategies were discussed and practiced w/ Marissa Gilmore using automatic speech tasks-- when she utilized strategies of slowing rate of speech, emphasizing speech sounds, and increasing volume, her intelligibility improved.   Education and practice of articulation strategies was completed w/ Marissa Gilmore w/ focus on Big, Loud speech and Slowing down when talking. Handouts given on strategies and exs. Recommend ongoing Dysarthria tx/exs targeting such in order to improve speech intelligibility in communication ADLs w/ ST services f/u during this admit and post D/C to next venue of care. MD updated. SLP Visit Diagnosis: Dysphagia, oral phase (R13.11);Dysarthria and anarthria (R47.1)    Aspiration Risk  Mild aspiration risk;Moderate aspiration risk;Risk for inadequate nutrition/hydration (in setting of oral phase dysphagia; OM weakness. Reduced following general aspiration precautions.)    Diet Recommendation   Thin;Dysphagia 2 (chopped) (added purees) = a Minced consistency diet w/ well-moistened foods; Thin liquids via CUP -- carefully monitor and Marissa Gilmore should help Hold Cup when drinking. Recommend strict aspiration precautions, tray setup and sitting up support. Cues for Left oral clearing using lingual sweeping. Reduce distractions/talking during eating/drinking.   Medication Administration: Crushed with puree    Other  Recommendations Recommended Consults:  (Neurology; Dietician f/u) Oral Care Recommendations: Oral care BID;Oral care before and after PO;Staff/trained caregiver to provide oral care    Recommendations for follow up therapy are one component of a multi-disciplinary discharge planning process, led by the attending physician.  Recommendations may be updated based on patient status, additional functional criteria and insurance authorization.  Follow up Recommendations Follow physician's recommendations for discharge plan and follow up  therapies (skilled ST services at d/c)      Assistance Recommended at Discharge  FULL  Functional Status Assessment Patient has had a recent decline in their functional status and demonstrates the ability to make significant improvements in function in a reasonable and predictable amount of time.  Frequency and Duration min 2x/week  2 weeks       Prognosis Prognosis for improved oropharyngeal function: Fair Barriers to Reach Goals: Time post onset;Severity of deficits Barriers/Prognosis Comment: advanced age      Swallow Study   General Date of Onset: 07/08/23 HPI: Marissa Gilmore is a 89 year old female with a history of CHF, HTN, paroxysmal atrial fibrillation on Eliquis who presented to the ED this morning with Afib with RVR and reports of bleeding.  Patient reports that for the past 2 days she has been dealing with persistent vaginal bleeding and passing clots about the size of a golf ball. She was placed on a cardizem drip but then at 18:45 she became hypotensive and weak on the Left side. Neurology has consulted Marissa Gilmore and assessed Left sided weakness w/ improvement of her s/s since initial complaint.  Marissa Gilmore and friend stated the s/s "come and go" -- MD and NSG were informed of this and the presence of Left OM weakenss w/ Dysarthria and Dysphagia at the time of this BSE/SLE (1400 on 07/09/2023).   Imaging today: MRI HEAD IMPRESSION:     1. Subtle 1.5 cm focus of diffusion signal abnormality involving the  right frontal corona radiata, suspicious for an acute ischemic  infarct. No associated hemorrhage or mass effect.  2. Underlying minor chronic microvascular ischemic disease with tiny  remote right occipital and left cerebellar infarcts.     MRA HEAD IMPRESSION:     1. Acute occlusion of a proximal right M2 branch, superior division.  2. Additional moderate proximal right M2 stenosis, inferior  division.  3. Severe distal right P2 stenosis, with additional moderate left P2  stenosis.  4. Focal severe right A1  stenosis. Type of Study: Bedside Swallow Evaluation Previous Swallow Assessment: none Diet Prior to this Study: Regular;Thin liquids (Level 0) Temperature Spikes Noted: No (wbc 10.3) Respiratory Status: Room air History of Recent Intubation: No Behavior/Cognition: Alert;Cooperative;Pleasant mood (Dysarthria) Oral Cavity Assessment: Within Functional Limits Oral Care Completed by SLP: Yes Oral Cavity - Dentition: Adequate natural dentition Vision: Functional for self-feeding Self-Feeding Abilities: Able to feed self;Needs set up (support) Patient Positioning: Upright in bed (positioning support) Baseline Vocal Quality: Low vocal intensity (-Fair) Volitional Cough: Strong Volitional Swallow: Able to elicit    Oral/Motor/Sensory Function Overall Oral Motor/Sensory Function: Moderate impairment (-Severe impairment) Facial ROM: Reduced left;Suspected CN VII (facial) dysfunction Facial Symmetry: Abnormal symmetry left;Suspected CN VII (facial) dysfunction Facial Strength: Reduced left;Suspected CN VII (facial) dysfunction Facial Sensation: Reduced left;Suspected CN V (Trigeminal) dysfunction Lingual ROM: Within Functional Limits Lingual Symmetry: Abnormal symmetry left;Suspected CN XII (hypoglossal) dysfunction Lingual Strength: Reduced;Suspected CN XII (hypoglossal) dysfunction (across to the R side) Lingual Sensation: Reduced;Suspected CN VII (facial) dysfunction-anterior 2/3 tongue Velum: Within Functional Limits (grossly) Mandible: Within Functional Limits   Ice Chips Ice chips: Impaired Presentation: Spoon (fed 2 trials) Oral Phase Impairments: Reduced labial seal;Reduced lingual movement/coordination Oral Phase Functional Implications: Left anterior spillage;Prolonged oral transit Pharyngeal Phase Impairments:  (none)   Thin Liquid Thin Liquid: Impaired Presentation: Cup;Self  Fed (10+ trials) Oral Phase Impairments: Reduced labial seal;Reduced lingual movement/coordination Oral  Phase Functional Implications: Left anterior spillage;Prolonged oral transit Pharyngeal  Phase Impairments:  (none) Other Comments: consistent spillage    Nectar Thick Nectar Thick Liquid: Not tested   Honey Thick Honey Thick Liquid: Not tested   Puree Puree: Impaired Presentation: Spoon (fed, 8 trials) Oral Phase Impairments: Reduced labial seal;Reduced lingual movement/coordination Oral Phase Functional Implications: Left anterior spillage;Oral residue;Prolonged oral transit Pharyngeal Phase Impairments:  (none)   Solid     Solid: Impaired (cut) Presentation: Spoon (fed; 2 trials) Oral Phase Impairments: Reduced labial seal;Reduced lingual movement/coordination;Impaired mastication Oral Phase Functional Implications: Left anterior spillage;Impaired mastication;Prolonged oral transit Pharyngeal Phase Impairments:  (none)        Jerilynn Som, MS, CCC-SLP Speech Language Pathologist Rehab Services; [facility] [phone] (ascom)  Nehemiah Mcfarren 07/09/2023,2:49 PM

## 2023-07-09 NOTE — ED Notes (Signed)
Patient's heart rate has increased into the 130's on average. Dr. Para March made aware

## 2023-07-09 NOTE — ED Notes (Signed)
Dr. Zhang at bedside.  

## 2023-07-09 NOTE — Hospital Course (Addendum)
Marissa Gilmore is a 89 y.o. female with medical history significant for Chronic atrial fibrillation on Eliquis and amiodarone, systolic CHF, HTN and HLD who presented to the ED initially with a 2-day history of persistent vaginal bleeding including passage of golf ball size clots.  While in the emergency room, patient developed atrial fibrillation with RVR, then subsequently she developed left-sided facial droop and slurred speech. MRI brain showed subtle 1.5 cm focus of diffusion signal abnormality involving the right frontal corona radiata, suspicious for an acute ischemic infarct. MRA head: 1. Acute occlusion of a proximal right M2 branch, superior division. 2. Additional moderate proximal right M2 stenosis, inferior division. 3. Severe distal right P2 stenosis, with additional moderate left P2 stenosis. 4. Focal severe right A1 stenosis. Patient is seen by neurology, due to large amount of vaginal bleeding, anticoagulation was not able to start. Patient continued to have vaginal bleeding while in the hospital. D&C was performed on 9/7 for vaginal bleeding.  9/11.  Reviewed that patient received 5 units of packed red blood cells during the hospital course already.  Will give another unit of packed red blood cells today for a heme globin that dipped down to 7.9.  Case discussed with gynecology and interventional radiology and they did a bilateral gonadal artery embolization and IVC filter.  Stopped heparin drip with continued vaginal bleeding. 9/12.  Called this morning with rapid atrial fibrillation.  When I saw the patient she was drooling out of the left side of her mouth and had left arm and left leg weakness.  Case discussed with Dr. Selina Cooley neurology who came and saw the patient quickly and a code stroke was called.  CT angio showed unchanged occlusion of the right MCA proximal superior M2 division, unchanged moderate stenosis of the right MCA proximal inferior M2 division, unchanged severe  stenosis of the right PCA P2 division, severe stenosis of the proximal left cervical ICA, no core infarct on the CT perfusion, 9 mm ischemic penumbra in the right frontal operculum.  CT head shows evolving late subacute infarct of the right frontal operculum.  Case discussed with neurology and will give aspirin.  Unable to give major blood thinner right now with her vaginal bleeding.  MRI of the brain shows acute infarcts in the right frontal lobe and right anterior parietal lobe with majority of the infarct new compared to 07/09/2023. 9/13.  Patient still with left-sided weakness and difficulty swallowing.  Patient confirmed DNR and DNI status.  The patient does not want a feeding tube.  Diet as tolerated.  Speech therapy did not think that she was going to keep up with her nutritional needs.  The patient was interested in hospice care.  Patient accepted to hospice home.

## 2023-07-09 NOTE — Evaluation (Signed)
Occupational Therapy Evaluation Patient Details Name: Marissa Gilmore MRN: [id number] DOB: 1931-02-17 Today's Date: 07/09/2023   History of Present Illness 89 y.o. female with medical history significant for Chronic atrial fibrillation on Eliquis and amiodarone, systolic CHF, HTN and HLD who presented to the ED initially with a 2-day history of persistent vaginal bleeding including passage of golf ball size clots.  She has been using pads which are becoming soaked at least twice a day.  She has had no prior symptoms since menopause and has not seen an OB/GYN of late.  She denies chest pain or shortness of breath or lightheadedness.   Clinical Impression   Patient presenting with decreased Ind in self care,balance, functional mobility/transfers, endurance, and safety awareness. Patient reports living at home alone without use of AD. She does not drive and neighbors assist her with getting groceries and going to church. Pt uses microwave for meals. Pt performs bed mobility with min A and stands with min A for several side steps to the L before HR increased to 170's and pt needing mod A to return to bed. Pt fatigues quickly. Patient will benefit from acute OT to increase overall independence in the areas of ADLs, functional mobility, and safety awareness in order to safely discharge.      If plan is discharge home, recommend the following: A lot of help with walking and/or transfers;A lot of help with bathing/dressing/bathroom;Assistance with cooking/housework;Assist for transportation;Help with stairs or ramp for entrance    Functional Status Assessment  Patient has had a recent decline in their functional status and demonstrates the ability to make significant improvements in function in a reasonable and predictable amount of time.  Equipment Recommendations  Other (comment);BSC/3in1 (RW)       Precautions / Restrictions Precautions Precautions: Fall Precaution Comments: HR      Mobility  Bed Mobility Overal bed mobility: Needs Assistance Bed Mobility: Supine to Sit, Sit to Supine     Supine to sit: Min assist Sit to supine: Mod assist        Transfers Overall transfer level: Needs assistance Equipment used: 1 person hand held assist Transfers: Sit to/from Stand Sit to Stand: Min assist                  Balance Overall balance assessment: Needs assistance Sitting-balance support: Feet supported Sitting balance-Leahy Scale: Fair     Standing balance support: During functional activity, Single extremity supported Standing balance-Leahy Scale: Poor                             ADL either performed or assessed with clinical judgement   ADL Overall ADL's : Needs assistance/impaired                     Lower Body Dressing: Maximal assistance Lower Body Dressing Details (indicate cue type and reason): assistance to don B socks from bed level Toilet Transfer: Minimal assistance;Moderate assistance Toilet Transfer Details (indicate cue type and reason): simulated transfer                 Vision Patient Visual Report: No change from baseline              Pertinent Vitals/Pain Pain Assessment Pain Assessment: No/denies pain     Extremity/Trunk Assessment Upper Extremity Assessment Upper Extremity Assessment: Generalized weakness   Lower Extremity Assessment Lower Extremity Assessment: Generalized weakness       Communication  Cognition Arousal: Alert Behavior During Therapy: WFL for tasks assessed/performed Overall Cognitive Status: Within Functional Limits for tasks assessed                                                  Home Living Family/patient expects to be discharged to:: Private residence Living Arrangements: Alone Available Help at Discharge: Friend(s);Neighbor;Available PRN/intermittently Type of Home: House Home Access: Stairs to enter [employer] of Steps:  4 Entrance Stairs-Rails: Right;Left Home Layout: One level     Bathroom Shower/Tub: Sponge bathes at baseline   [employer]: Standard     Home Equipment: Cane - single point          Prior Functioning/Environment Prior Level of Function : Independent/Modified Independent               ADLs Comments: Pt reports living at home alone without use of AD. She does not drive and neighbors take her to store for groceries and church. Cooks microwave meals and takes sink bath at baseline.        OT Problem List: Decreased strength;Decreased activity tolerance;Decreased safety awareness;Impaired balance (sitting and/or standing);Decreased knowledge of use of DME or AE      OT Treatment/Interventions: Self-care/ADL training;Therapeutic exercise;Therapeutic activities;Energy conservation;Patient/family education;Balance training;Manual therapy    OT Goals(Current goals can be found in the care plan section) Acute Rehab OT Goals Patient Stated Goal: to go home OT Goal Formulation: With patient Time For Goal Achievement: 07/23/23 Potential to Achieve Goals: Fair ADL Goals Pt Will Perform Grooming: with modified independence;standing Pt Will Perform Lower Body Dressing: with supervision;sit to/from stand Pt Will Transfer to Toilet: with supervision;ambulating Pt Will Perform Toileting - Clothing Manipulation and hygiene: with supervision;sit to/from stand  OT Frequency: Min 1X/week    AM-PAC OT "6 Clicks" Daily Activity     Outcome Measure Help from another person eating meals?: None Help from another person taking care of personal grooming?: A Little Help from another person toileting, which includes using toliet, bedpan, or urinal?: A Lot Help from another person bathing (including washing, rinsing, drying)?: A Lot Help from another person to put on and taking off regular upper body clothing?: A Little Help from another person to put on and taking off regular lower body  clothing?: A Lot 6 Click Score: 16   End of Session Nurse Communication: Mobility status  Activity Tolerance: Patient limited by fatigue;Treatment limited secondary to medical complications (Comment) (elevated HR) Patient left: in bed;with call bell/phone within reach;with bed alarm set  OT Visit Diagnosis: Unsteadiness on feet (R26.81);Muscle weakness (generalized) (M62.81)                Time: 1001-1016 OT Time Calculation (min): 15 min Charges:  OT General Charges $OT Visit: 1 Visit OT Evaluation $OT Eval Moderate Complexity: 1 Mod OT Treatments $Therapeutic Activity: 8-22 mins  Jackquline Denmark, MS, OTR/L , CBIS ascom [phone]  07/09/23, 1:00 PM

## 2023-07-09 NOTE — Assessment & Plan Note (Addendum)
EF 30% 2021 Clinically euvolemic

## 2023-07-09 NOTE — Consult Note (Signed)
NEURO HOSPITALIST CONSULT NOTE   Requestig physician: Dr. Para March  Reason for Consult: Acute stroke on MRI  History obtained from: Daughter and Chart     HPI:                                                                                                                                          Marissa Gilmore is a 89 y.o. female with a PMHx of chronic atrial fibrillation on Eliquis, cataract, systolic HF with 09% EF, HLD and HTN who initially presented to the ED for assessment of vaginal bleeding. Code stroke was called after she experienced sudden onset neurologic deficit with dysarthria and left facial droop. CT head was negative for acute process. Patient was evaluated by Teleneurology and was not deemed to be a candidate for TNK. Due to low suspicion for LVO and current AKI seen on workup, CTA was not recommended by Teleneurology. MRI confirmed acute faint restricted diffusion in the corona radiata on the right, within the MCA territory. LKN was 1845 on Wednesday.   Past Medical History:  Diagnosis Date   Atrial fibrillation (HCC)    Cataract    Diastolic heart failure (HCC)    35% EF   Hypertension     Past Surgical History:  Procedure Laterality Date   CATARACT EXTRACTION Right     Family History  Problem Relation Age of Onset   Heart disease Mother        no MI   Peripheral Artery Disease Mother 46   Bladder Cancer Father 27             Social History:  reports that she has never smoked. She has never used smokeless tobacco. She reports that she does not drink alcohol and does not use drugs.  No Known Allergies  MEDICATIONS:                                                                                                                     No current facility-administered medications on file prior to encounter.   Current Outpatient Medications on File Prior to Encounter  Medication Sig Dispense Refill   amiodarone (PACERONE) 200 MG tablet Take  200 mg by mouth as directed. Take 1 tablet (200 mg total) by mouth as  directed Take 1 tablet twice daily for 2 weeks. Thereafter take 1 tablet daily.     atorvastatin (LIPITOR) 40 MG tablet Take 1 tablet (40 mg total) by mouth daily. 90 tablet 3   ELIQUIS 2.5 MG TABS tablet SMARTSIG:1 Tablet(s) By Mouth Every 12 Hours     metoprolol tartrate (LOPRESSOR) 25 MG tablet Take 25 mg by mouth 2 (two) times daily.      potassium chloride (KLOR-CON) 20 MEQ packet Take 20 mEq by mouth daily.     sacubitril-valsartan (ENTRESTO) 24-26 MG Take 1 tablet by mouth 2 (two) times daily.      spironolactone (ALDACTONE) 25 MG tablet Take 12.5 mg by mouth daily.     torsemide (DEMADEX) 20 MG tablet Take 20 mg by mouth 2 (two) times daily.     furosemide (LASIX) 40 MG tablet TAKE 1 TABLET BY MOUTH EVERY DAY (Patient not taking: Reported on 07/09/2023) 90 tablet 1    Scheduled:  [START ON 07/10/2023]  stroke: early stages of recovery book   Does not apply Once   [START ON 07/10/2023] aspirin EC  81 mg Oral Daily   atorvastatin  40 mg Oral Daily   progesterone  200 mg Oral Daily   Continuous:  sodium chloride 75 mL/hr at 07/09/23 0728     ROS:                                                                                                                                       Unable to obtain due to severe dysarthria and expressive dysphasia.    Blood pressure (!) 87/55, pulse 86, temperature 98 F (36.7 C), temperature source Oral, resp. rate (!) 24, height 5\' 4"  (1.626 m), weight 58.1 kg, SpO2 99%.   General Examination:                                                                                                       Physical Exam HEENT- [place]/AT   Lungs- Respirations unlabored Extremities- Warm and well-perfused  Neurological Examination Mental Status: Awake and alert. Oriented x 5. Speech is severely dysarthric and dysfluent with a stuttering quality. Naming is intact. Repetition also intact in the  context of the halting speech and dysarthria. Comprehension intact; able to follow all commands.  Cranial Nerves: II: Temporal visual fields intact with no extinction to DSS. PERRL. III,IV, VI: No ptosis. EOMI. No nystagmus. V: Temp sensation equal bilaterally VII: Left facial droop VIII: Hearing intact to voice IX,X:  No hypophonia or hoarseness XI: Symmetric XII: Midline tongue extension Motor: RUE and RLE 5/5 LUE 4/5 LLE 5/5 Sensory: Temp and FT intact x 4. Positive for left sided extinction when testing with DSS. Deep Tendon Reflexes: Unremarkable Plantars: Equivocal bilaterally Cerebellar: No ataxia with FNF on the right. Dysmetric FNF on the left with poor fine motor coordination.  Gait: Deferred    Lab Results: Basic Metabolic Panel: Recent Labs  Lab 07/08/23 1531  NA 136  K 4.5  CL 102  CO2 20*  GLUCOSE 137*  BUN 45*  CREATININE 2.27*  CALCIUM 9.9  MG 2.2    CBC: Recent Labs  Lab 07/08/23 1531 07/08/23 1955 07/09/23 0406  WBC 10.3  --   --   NEUTROABS 8.4*  --   --   HGB 9.1* 8.4* 9.3*  HCT 29.0* 25.7*  --   MCV 99.0  --   --   PLT 208  --   --     Cardiac Enzymes: No results for input(s): "CKTOTAL", "CKMB", "CKMBINDEX", "TROPONINI" in the last 168 hours.  Lipid Panel: Recent Labs  Lab 07/09/23 0406  CHOL 132  TRIG 86  HDL 46  CHOLHDL 2.9  VLDL 17  LDLCALC 69    Imaging: MR BRAIN WO CONTRAST  Addendum Date: 07/09/2023   ADDENDUM REPORT: 07/09/2023 03:11 ADDENDUM: In addition to the initially described findings, note is made of a 9 mm T1 hypointense lesion within the C4 vertebral body, indeterminate. This is described in the body of the report, omitted from the impression by mistake. Further evaluation with dedicated MRI of the cervical spine, with and without contrast, suggested for further evaluation as warranted. Electronically Signed   By: Rise Mu M.D.   On: 07/09/2023 03:11   Result Date: 07/09/2023 CLINICAL DATA:  Initial  evaluation for neuro deficit, stroke suspected. Left-sided deficit. EXAM: MRI HEAD WITHOUT CONTRAST MRA HEAD WITHOUT CONTRAST TECHNIQUE: Multiplanar, multi-echo pulse sequences of the brain and surrounding structures were acquired without intravenous contrast. Angiographic images of the Circle of Willis were acquired using MRA technique without intravenous contrast. COMPARISON:  Comparison made with prior CT from 07/08/2023. FINDINGS: MRI HEAD FINDINGS Brain: Cerebral volume within normal limits. Minimal hazy FLAIR signal abnormality noted involving the periventricular [place] matter, likely related to chronic microvascular ischemic disease, minimal in nature and less than is typically seen for age. Tiny remote infarct present at the right occipital lobe (series 20, image 21). Additional tiny remote left cerebellar infarct noted. Subtle focus of diffusion signal abnormality measuring approximately 1.5 cm seen at the right frontal corona radiata (series 10, image 78). Associated subtle signal loss on ADC map (series 11, image 30). Findings suspicious for a possible acute ischemic infarct. No associated hemorrhage or mass effect. No other evidence for acute or subacute ischemia. Gray-Barcomb matter differentiation otherwise maintained. No areas of chronic cortical infarction. No significant acute or chronic intracranial blood products. No mass lesion, midline shift or mass effect. No hydrocephalus or extra-axial fluid collection. Pituitary gland and suprasellar region within normal limits. Vascular: Major intracranial vascular flow voids are maintained. Skull and upper cervical spine: Craniocervical junction within normal limits. 9 mm T1 hypointense lesion noted within the visualized C4 vertebral body (series 14, image 12), indeterminate. Bone marrow signal intensity otherwise normal. No scalp soft tissue abnormality. Sinuses/Orbits: Prior ocular lens replacement on the right. Globes and orbital soft tissues otherwise  unremarkable. Paranasal sinuses are largely clear. No significant mastoid effusion. Other: None. MRA HEAD FINDINGS Anterior circulation:  Visualized distal cervical segments of the internal carotid arteries are mildly tortuous but patent with antegrade flow. Petrous, cavernous, and supraclinoid segments patent. Left A1 segment widely patent. Focal severe right A1 stenosis (series 81191, image 8). Normal anterior communicating artery complex. Anterior cerebral arteries patent without stenosis. No M1 stenosis or occlusion. Left MCA branches well perfused. On the right, there is acute occlusion of a proximal right M2 branch, superior division (series 47829, image 13). Inferior division remains patent and perfused, although a probable moderate proximal right M2 stenosis noted (series 56213, image 12). Posterior circulation: Both vertebral arteries are patent to the vertebrobasilar junction without visible stenosis. Right PICA patent. Left PICA not well seen. Basilar patent without stenosis. Superior cerebral arteries patent bilaterally. Both PCAs primarily supplied via the basilar. Focal moderate left P2 stenosis (series 1021, image 2). Left PCA otherwise patent. On the right, there is a severe distal right P2 stenosis (series 1021, image 7). Right PCA is markedly attenuated distally and only faintly visible on 3D time-of-flight sequence. Anatomic variants: None significant.  No aneurysm. IMPRESSION: MRI HEAD IMPRESSION: 1. Subtle 1.5 cm focus of diffusion signal abnormality involving the right frontal corona radiata, suspicious for an acute ischemic infarct. No associated hemorrhage or mass effect. 2. 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 MRA HEAD IMPRESSION: 1. Acute occlusion of a proximal right M2 branch, superior division. 2. Additional moderate proximal right M2 stenosis, inferior division. 3. Severe distal right P2 stenosis, with  additional moderate left P2 stenosis. 4. Focal severe right A1 stenosis. Electronically Signed: By: Rise Mu M.D. On: 07/09/2023 03:05   MR ANGIO HEAD WO CONTRAST  Addendum Date: 07/09/2023   ADDENDUM REPORT: 07/09/2023 03:11 ADDENDUM: In addition to the initially described findings, note is made of a 9 mm T1 hypointense lesion within the C4 vertebral body, indeterminate. This is described in the body of the report, omitted from the impression by mistake. Further evaluation with dedicated MRI of the cervical spine, with and without contrast, suggested for further evaluation as warranted. Electronically Signed   By: Rise Mu M.D.   On: 07/09/2023 03:11   Result Date: 07/09/2023 CLINICAL DATA:  Initial evaluation for neuro deficit, stroke suspected. Left-sided deficit. EXAM: MRI HEAD WITHOUT CONTRAST MRA HEAD WITHOUT CONTRAST TECHNIQUE: Multiplanar, multi-echo pulse sequences of the brain and surrounding structures were acquired without intravenous contrast. Angiographic images of the Circle of Willis were acquired using MRA technique without intravenous contrast. COMPARISON:  Comparison made with prior CT from 07/08/2023. FINDINGS: MRI HEAD FINDINGS Brain: Cerebral volume within normal limits. Minimal hazy FLAIR signal abnormality noted involving the periventricular [place] matter, likely related to chronic microvascular ischemic disease, minimal in nature and less than is typically seen for age. Tiny remote infarct present at the right occipital lobe (series 20, image 21). Additional tiny remote left cerebellar infarct noted. Subtle focus of diffusion signal abnormality measuring approximately 1.5 cm seen at the right frontal corona radiata (series 10, image 78). Associated subtle signal loss on ADC map (series 11, image 30). Findings suspicious for a possible acute ischemic infarct. No associated hemorrhage or mass effect. No other evidence for acute or subacute ischemia. Gray-Lemma matter  differentiation otherwise maintained. No areas of chronic cortical infarction. No significant acute or chronic intracranial blood products. No mass lesion, midline shift or mass effect. No hydrocephalus or extra-axial fluid collection. Pituitary gland and suprasellar region within normal limits. Vascular: Major intracranial vascular flow voids are maintained.  Skull and upper cervical spine: Craniocervical junction within normal limits. 9 mm T1 hypointense lesion noted within the visualized C4 vertebral body (series 14, image 12), indeterminate. Bone marrow signal intensity otherwise normal. No scalp soft tissue abnormality. Sinuses/Orbits: Prior ocular lens replacement on the right. Globes and orbital soft tissues otherwise unremarkable. Paranasal sinuses are largely clear. No significant mastoid effusion. Other: None. MRA HEAD FINDINGS Anterior circulation: Visualized distal cervical segments of the internal carotid arteries are mildly tortuous but patent with antegrade flow. Petrous, cavernous, and supraclinoid segments patent. Left A1 segment widely patent. Focal severe right A1 stenosis (series 16109, image 8). Normal anterior communicating artery complex. Anterior cerebral arteries patent without stenosis. No M1 stenosis or occlusion. Left MCA branches well perfused. On the right, there is acute occlusion of a proximal right M2 branch, superior division (series 60454, image 13). Inferior division remains patent and perfused, although a probable moderate proximal right M2 stenosis noted (series 09811, image 12). Posterior circulation: Both vertebral arteries are patent to the vertebrobasilar junction without visible stenosis. Right PICA patent. Left PICA not well seen. Basilar patent without stenosis. Superior cerebral arteries patent bilaterally. Both PCAs primarily supplied via the basilar. Focal moderate left P2 stenosis (series 1021, image 2). Left PCA otherwise patent. On the right, there is a severe distal  right P2 stenosis (series 1021, image 7). Right PCA is markedly attenuated distally and only faintly visible on 3D time-of-flight sequence. Anatomic variants: None significant.  No aneurysm. IMPRESSION: MRI HEAD IMPRESSION: 1. Subtle 1.5 cm focus of diffusion signal abnormality involving the right frontal corona radiata, suspicious for an acute ischemic infarct. No associated hemorrhage or mass effect. 2. 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 MRA HEAD IMPRESSION: 1. Acute occlusion of a proximal right M2 branch, superior division. 2. Additional moderate proximal right M2 stenosis, inferior division. 3. Severe distal right P2 stenosis, with additional moderate left P2 stenosis. 4. Focal severe right A1 stenosis. Electronically Signed: By: Rise Mu M.D. On: 07/09/2023 03:05   US PELVIC COMPLETE WITH TRANSVAGINAL  Result Date: 07/08/2023 CLINICAL DATA:  Vaginal bleeding in a 89 year old female EXAM: TRANSABDOMINAL ULTRASOUND OF PELVIS TECHNIQUE: Transabdominal ultrasound examination of the pelvis was performed including evaluation of the uterus, ovaries, adnexal regions, and pelvic cul-de-sac. Patient declined transvaginal exam. COMPARISON:  None Available. FINDINGS: Uterus Measurements: 50.3 x 6.6 x 8.6 cm = volume: 452 mL. Enlarged heterogenous uterus with multiple fibroids measuring up to 7.8 cm. Endometrium Thickness: 24 mm. Thickened heterogenous endometrium without definite focal lesion. Right ovary Not visualized due to enlarged heterogenous uterus and overlying bowel. Left ovary Not visualized due to enlarged heterogenous uterus and overlying. Other findings:  No abnormal free fluid. IMPRESSION: 1. Thickened heterogenous endometrium measuring up to 24 mm. In the setting of post-menopausal bleeding, endometrial sampling is indicated to exclude carcinoma. If results are benign, sonohysterogram should be considered for  focal lesion work-up. (Ref: Radiological Reasoning: Algorithmic Workup of Abnormal Vaginal Bleeding with Endovaginal Sonography and Sonohysterography. AJR 2008; [id number]:[id number]) 2. Enlarged heterogenous uterus with multiple fibroids. 3. Nonvisualization of the ovaries. Electronically Signed   By: Minerva Fester M.D.   On: 07/08/2023 20:49   CT HEAD CODE STROKE WO CONTRAST  Result Date: 07/08/2023 CLINICAL DATA:  Code stroke.  Neuro deficit, acute, stroke suspected EXAM: CT HEAD WITHOUT CONTRAST TECHNIQUE: Contiguous axial images were obtained from the base of the skull through the vertex without intravenous contrast. RADIATION DOSE REDUCTION: This exam was  performed according to the departmental dose-optimization program which includes automated exposure control, adjustment of the mA and/or kV according to patient size and/or use of iterative reconstruction technique. COMPARISON:  CT head 07/21/2017. FINDINGS: Brain: No evidence of acute infarction, hemorrhage, hydrocephalus, extra-axial collection or mass lesion/mass effect. Vascular: No hyperdense vessel or unexpected calcification. Skull: Normal. Negative for fracture or focal lesion. Sinuses/Orbits: No acute finding. Other: None. ASPECTS [facility] Stroke Program Early CT Score) Total score (0-10 with 10 being normal): 10. IMPRESSION: 1. No evidence of acute intracranial abnormality. 2. ASPECTS is 10. Code stroke imaging results were communicated on 07/08/2023 at 8:12 pm to provider Chesley Noon via telephone, who verbally acknowledged these results. Electronically Signed   By: Feliberto Harts M.D.   On: 07/08/2023 20:13     Assessment:  89 y.o. female with a PMHx of chronic atrial fibrillation on Eliquis, cataract, systolic HF with 81% EF, HLD and HTN who initially presented to the ED for assessment of vaginal bleeding. She was placed on a cardizem drip for RVR but then at 18:45 PM she became hypotensive and weak on the left side with left facial droop and  dysarthria. Code stroke was then called. CT head was negative for acute process. Patient was evaluated by Teleneurology and she was not deemed to be a candidate for TNK due to anticoagulation. NIHSS was 2 with Teleneurology. Due to low suspicion for LVO and current AKI seen on workup, CTA was not recommended by Teleneurology. MRI confirmed acute faint restricted diffusion in the corona radiata on the right, within the MCA territory, as well as right M2 acute occlusion. As she continued to have a low NIHSS, and in the setting of her AKI, thrombectomy continued not to be a good option from a risk/benefit standpoint at the time of MRI/MRA completion. After Hospitalist discussion with ED provider, decision was made to administer Kcentra for Eliquis reversal. She has had some fluctuating neurological symptoms since Teleneurology evaluation, most likely related to her intermittently soft blood pressures. Also received a blood transfusion. She is currently on IVF.  - Exam reveals left facial droop, LUE weakness, sensory extinction on the left and severely dysarthric speech that is dysfluent with a stuttering quality. However, naming comprehension and repetition are all intact in the context of the halting speech and dysarthria. She is fully oriented.    - Labs: Hemoglobin 9.1 > 8.4, creatinine 2.27 up from 1.5 about 5 months previously. Elevated BUN of 45. Troponin 15.  Magnesium and potassium normal. HgbA1c in April was 6.4. Fasting lipid panel negative.  - EKG in the ED showed A-fib with a heart rate of 126 - MRI brain (0305 AM today): Subtle 1.5 cm focus of faint diffusion signal abnormality involving the right frontal corona radiata, suspicious for an acute ischemic infarct. No associated hemorrhage or mass effect. 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  - MRA head (0305 AM today): Acute occlusion of a proximal right M2 branch,  superior division. Additional moderate proximal right M2 stenosis, inferior division. Severe distal right P2 stenosis, with additional moderate left P2 stenosis. Focal severe right A1 stenosis. - TTE: LVEF 55 to 60%. The left ventricle has normal function. The left ventricle has no regional  wall motion abnormalities. Left atrial size was mildly dilated. No valvular vegetation or mural thrombus mentioned in the report.  - Continues not to be a candidate for thrombectomy due to high risk of procedure in the setting  of her frailty with significantly increased risk of hemorrhagic conversion from luxury perfusion if she is treated this many hours out from initial stroke detection by MRI. Additional factors also contribute to the high risk of thrombectomy in this patient, including further bleeding from puncture site in an already anemic patient and her AKI.    Recommendations: - Carotid ultrasound.  - Unrelated to her acute presentation is a 9 mm T1 hypointense lesion within the C4 vertebral body. Radiology recommends further imaging with dedicated MRI of the cervical spine, with and without contrast.  - Cardiac telemetry - Regarding anticoagulation, will need to continue to hold until the source of her vaginal bleeding is diagnosed and fully treated.  - Agree with ASA for now to provide some stroke prophylaxis in the context of her Eliquis having been stopped due to heavy vaginal bleeding. Will defer to primary team and GYN if ASA needs to temporarily be held for continued bleeding or a procedure.  - Permissive HTN - Frequent neuro checks - PT/OT/Speech - Per GYN, continue progesterone 200 mg daily. They feel that there is not much they can do for her at this time except to keep her on progesterone and for her to follow up with GYN as an outpatient to talk about evaluation of a thickened stripe and PMB. Additional inpatient management consists of serial H&H's and transfuse as necessary. - Frequent neuro  checks - NPO until passes stroke swallow screen   Addendum: NIHSS increased to 7 at 1219. Not a good candidate for thrombectomy due to her frailty with significantly increased risk of hemorrhagic conversion from luxury perfusion. Additional factors also contribute to the high risk of thrombectomy in this patient, including further bleeding from puncture site in an already anemic patient and her AKI.     Electronically signed: Dr. Caryl Pina 07/09/2023, 8:11 AM

## 2023-07-09 NOTE — Assessment & Plan Note (Addendum)
Postmenopausal vaginal bleeding. Patient received 6 units of packed red blood cells during the hospital course.  Patient had bilateral gonadal artery embolization 9/11 by interventional radiology.  This morning's hemoglobin at 8.5.

## 2023-07-09 NOTE — Assessment & Plan Note (Addendum)
Resolved

## 2023-07-10 DIAGNOSIS — N179 Acute kidney failure, unspecified: Secondary | ICD-10-CM | POA: Diagnosis not present

## 2023-07-10 DIAGNOSIS — I639 Cerebral infarction, unspecified: Secondary | ICD-10-CM | POA: Diagnosis not present

## 2023-07-10 DIAGNOSIS — Z515 Encounter for palliative care: Secondary | ICD-10-CM

## 2023-07-10 DIAGNOSIS — D62 Acute posthemorrhagic anemia: Secondary | ICD-10-CM | POA: Diagnosis not present

## 2023-07-10 DIAGNOSIS — I4891 Unspecified atrial fibrillation: Secondary | ICD-10-CM | POA: Diagnosis not present

## 2023-07-10 DIAGNOSIS — N939 Abnormal uterine and vaginal bleeding, unspecified: Secondary | ICD-10-CM | POA: Diagnosis not present

## 2023-07-10 LAB — HEMOGLOBIN: Hemoglobin: 8.3 g/dL — ABNORMAL LOW (ref 12.0–15.0)

## 2023-07-10 LAB — PREPARE RBC (CROSSMATCH)

## 2023-07-10 LAB — CBC
HCT: 20.4 % — ABNORMAL LOW (ref 36.0–46.0)
Hemoglobin: 6.7 g/dL — ABNORMAL LOW (ref 12.0–15.0)
MCH: 30.9 pg (ref 26.0–34.0)
MCHC: 32.8 g/dL (ref 30.0–36.0)
MCV: 94 fL (ref 80.0–100.0)
Platelets: 150 10*3/uL (ref 150–400)
RBC: 2.17 MIL/uL — ABNORMAL LOW (ref 3.87–5.11)
RDW: 16.9 % — ABNORMAL HIGH (ref 11.5–15.5)
WBC: 8.5 10*3/uL (ref 4.0–10.5)
nRBC: 0 % (ref 0.0–0.2)

## 2023-07-10 LAB — BASIC METABOLIC PANEL
Anion gap: 7 (ref 5–15)
BUN: 22 mg/dL (ref 8–23)
CO2: 20 mmol/L — ABNORMAL LOW (ref 22–32)
Calcium: 8.9 mg/dL (ref 8.9–10.3)
Chloride: 114 mmol/L — ABNORMAL HIGH (ref 98–111)
Creatinine, Ser: 1.35 mg/dL — ABNORMAL HIGH (ref 0.44–1.00)
GFR, Estimated: 37 mL/min — ABNORMAL LOW (ref >60)
Glucose, Bld: 127 mg/dL — ABNORMAL HIGH (ref 70–99)
Potassium: 4.7 mmol/L (ref 3.5–5.1)
Sodium: 141 mmol/L (ref 135–145)

## 2023-07-10 LAB — MAGNESIUM: Magnesium: 2.2 mg/dL (ref 1.7–2.4)

## 2023-07-10 LAB — VITAMIN B12: Vitamin B-12: 135 pg/mL — ABNORMAL LOW (ref 180–914)

## 2023-07-10 MED ORDER — SODIUM BICARBONATE 650 MG PO TABS
650.0000 mg | ORAL_TABLET | Freq: Two times a day (BID) | ORAL | Status: DC
  Administered 2023-07-10 – 2023-07-11 (×3): 650 mg via ORAL
  Filled 2023-07-10 (×5): qty 1

## 2023-07-10 MED ORDER — METOPROLOL TARTRATE 25 MG PO TABS
25.0000 mg | ORAL_TABLET | Freq: Two times a day (BID) | ORAL | Status: DC
  Administered 2023-07-10 – 2023-07-17 (×15): 25 mg via ORAL
  Filled 2023-07-10 (×16): qty 1

## 2023-07-10 MED ORDER — SODIUM CHLORIDE 0.9% IV SOLUTION: Freq: Once | INTRAVENOUS | Status: AC

## 2023-07-10 MED ORDER — MEDROXYPROGESTERONE ACETATE 10 MG PO TABS
20.0000 mg | ORAL_TABLET | Freq: Three times a day (TID) | ORAL | Status: DC
  Administered 2023-07-10 – 2023-07-12 (×6): 20 mg via ORAL
  Filled 2023-07-10 (×10): qty 2

## 2023-07-10 MED ORDER — VITAMIN B-12 1000 MCG PO TABS
2000.0000 ug | ORAL_TABLET | Freq: Every day | ORAL | Status: DC
  Administered 2023-07-11 – 2023-07-16 (×6): 2000 ug via ORAL
  Filled 2023-07-10 (×6): qty 2

## 2023-07-10 NOTE — Progress Notes (Signed)
  Inpatient Rehab Admissions Coordinator :  Per therapy recommendations patient was screened for CIR candidacy by Ottie Glazier RN MSN. Patient is not yet at a level to tolerate the intensity required to pursue a CIR admit . Bed level assessment with PT today.The CIR admissions team will follow and monitor for progress and place a Rehab Consult order if felt to be appropriate. Otherwise, other rehab venues should be pursued at this time. Please contact me with any questions.  Ottie Glazier RN MSN Admissions Coordinator [phone]

## 2023-07-10 NOTE — Progress Notes (Addendum)
Subjective: Lying comfortably in bed with no complaints.   Objective: Current vital signs: BP 119/61 (BP Location: Right Arm)   Pulse 79   Temp 98.3 F (36.8 C)   Resp 18   Ht 5\' 4"  (1.626 m)   Wt 58.1 kg   SpO2 99%   BMI 21.97 kg/m  Vital signs in last 24 hours: Temp:  [97.8 F (36.6 C)-98.6 F (37 C)] 98.3 F (36.8 C) (09/05 2114) Pulse Rate:  [62-108] 79 (09/05 2114) Resp:  [18-22] 18 (09/05 2114) BP: (116-135)/(47-97) 119/61 (09/05 2114) SpO2:  [98 %-100 %] 99 % (09/05 2114)  Intake/Output from previous day: 09/04 0701 - 09/05 0700 In: 500 [IV Piggyback:500] Out: -  Intake/Output this shift: No intake/output data recorded. Nutritional status:  Diet Order             DIET - DYS 1 Room service appropriate? Yes; Fluid consistency: Nectar Thick  Diet effective now                   Physical Exam HEENT- [place]/AT   Lungs- Respirations unlabored Extremities- Warm and well-perfused   Neurological Examination Mental Status: Awake and alert. Oriented x 5. Speech is severely dysarthric and dysfluent with a stuttering quality. Naming is intact. Repetition also intact in the context of the halting speech and dysarthria. Comprehension intact; able to follow all commands.  Cranial Nerves: II: Temporal visual fields intact with no extinction to DSS. PERRL. III,IV, VI: No ptosis. EOMI. No nystagmus. V: Temp sensation equal bilaterally VII: Prominent left facial droop VIII: Hearing intact to voice IX,X: No hypophonia or hoarseness XI: Symmetric XII: Midline tongue extension Motor: RUE and RLE 5/5 LUE 3-4/5 LLE 5/5 Sensory: Temp and FT intact x 4.  Deep Tendon Reflexes: Normoactive x 4 Cerebellar: No ataxia with FNF on the right. Dysmetric FNF on the left with poor fine motor coordination.  Gait: Deferred  Lab Results: Results for orders placed or performed during the hospital encounter of 07/08/23 (from the past 48 hour(s))  Protime-INR     Status: None    Collection Time: 07/09/23  4:06 AM  Result Value Ref Range   Prothrombin Time TEST WILL BE CREDITED 11.4 - 15.2 seconds    Comment: Specimen [place] CORRECTED ON 09/04 AT 0531: PREVIOUSLY REPORTED AS 15.3    INR TEST WILL BE CREDITED 0.8 - 1.2    Comment: clotted Performed at [facility], [address] [place], [place] [address] CORRECTED ON 09/04 AT 0531: PREVIOUSLY REPORTED AS 1.2   APTT     Status: None   Collection Time: 07/09/23  4:06 AM  Result Value Ref Range   aPTT TEST WILL BE CREDITED 24 - 36 seconds    Comment: Performed at [facility], [address] [place], [place] [address] CORRECTED ON 09/04 AT 0531: PREVIOUSLY REPORTED AS 20   Lipid panel     Status: None   Collection Time: 07/09/23  4:06 AM  Result Value Ref Range   Cholesterol 132 0 - 200 mg/dL   Triglycerides 86 <027 mg/dL   HDL 46 >25 mg/dL   Total CHOL/HDL Ratio 2.9 RATIO   VLDL 17 0 - 40 mg/dL   LDL Cholesterol 69 0 - 99 mg/dL    Comment:        Total Cholesterol/HDL:CHD Risk Coronary Heart Disease Risk Table                     Men  Women  1/2 Average Risk   3.4   3.3  Average Risk       5.0   4.4  2 X Average Risk   9.6   7.1  3 X Average Risk  23.4   11.0        Use the calculated Patient Ratio above and the CHD Risk Table to determine the patient's CHD Risk.        ATP III CLASSIFICATION (LDL):  <100     mg/dL   Optimal  629-528  mg/dL   Near or Above                    Optimal  130-159  mg/dL   Borderline  413-244  mg/dL   High  >010     mg/dL   Very High Performed at [facility], [address] [place], [place] [address]   Hemoglobin     Status: Abnormal   Collection Time: 07/09/23  4:06 AM  Result Value Ref Range   Hemoglobin 9.3 (L) 12.0 - 15.0 g/dL    Comment: Performed at [facility], [address] [place], [place] [address]  Iron and TIBC     Status: Abnormal   Collection Time: 07/09/23  4:06 AM  Result Value Ref Range   Iron  243 (H) 28 - 170 ug/dL   TIBC 347 425 - 956 ug/dL   Saturation Ratios 83 (H) 10.4 - 31.8 %   UIBC 50 ug/dL    Comment: Performed at [facility], [address] [place], [place] [address]  Ferritin     Status: None   Collection Time: 07/09/23  4:06 AM  Result Value Ref Range   Ferritin 74 11 - 307 ng/mL    Comment: Performed at [facility], [address] [place], [place] [address]  Hemoglobin     Status: Abnormal   Collection Time: 07/09/23  8:02 AM  Result Value Ref Range   Hemoglobin 8.8 (L) 12.0 - 15.0 g/dL    Comment: Performed at [facility], [address] [place], [place] [address]  APTT     Status: None   Collection Time: 07/09/23  8:02 AM  Result Value Ref Range   aPTT 28 24 - 36 seconds    Comment: Performed at [facility], [address] [place], [place] [address]  Protime-INR     Status: Abnormal   Collection Time: 07/09/23  8:02 AM  Result Value Ref Range   Prothrombin Time 15.3 (H) 11.4 - 15.2 seconds   INR 1.2 0.8 - 1.2    Comment: (NOTE) INR goal varies based on device and disease states. Performed at [facility], [address] [place], [place] [address]   Urine Drug Screen, Qualitative     Status: None   Collection Time: 07/09/23  4:04 PM  Result Value Ref Range   Tricyclic, Ur Screen NONE DETECTED NONE DETECTED   Amphetamines, Ur Screen NONE DETECTED NONE DETECTED   MDMA (Ecstasy)Ur Screen NONE DETECTED NONE DETECTED   Cocaine Metabolite,Ur [place] NONE DETECTED NONE DETECTED   Opiate, Ur Screen NONE DETECTED NONE DETECTED   Phencyclidine (PCP) Ur S NONE DETECTED NONE DETECTED   Cannabinoid 50 Ng, Ur [place] NONE DETECTED NONE DETECTED   Barbiturates, Ur Screen NONE DETECTED NONE DETECTED   Benzodiazepine, Ur Scrn NONE DETECTED NONE DETECTED   Methadone Scn, Ur NONE DETECTED NONE DETECTED    Comment: (NOTE) Tricyclics +  metabolites, urine    Cutoff 1000 ng/mL Amphetamines + metabolites, urine  Cutoff  1000 ng/mL MDMA (Ecstasy), urine              Cutoff 500 ng/mL Cocaine Metabolite, urine          Cutoff 300 ng/mL Opiate + metabolites, urine        Cutoff 300 ng/mL Phencyclidine (PCP), urine         Cutoff 25 ng/mL Cannabinoid, urine                 Cutoff 50 ng/mL Barbiturates + metabolites, urine  Cutoff 200 ng/mL Benzodiazepine, urine              Cutoff 200 ng/mL Methadone, urine                   Cutoff 300 ng/mL  The urine drug screen provides only a preliminary, unconfirmed analytical test result and should not be used for non-medical purposes. Clinical consideration and professional judgment should be applied to any positive drug screen result due to possible interfering substances. A more specific alternate chemical method must be used in order to obtain a confirmed analytical result. Gas chromatography / mass spectrometry (GC/MS) is the preferred confirm atory method. Performed at [facility], [address] [place], [place] [address]   CBC     Status: Abnormal   Collection Time: 07/10/23  4:28 AM  Result Value Ref Range   WBC 8.5 4.0 - 10.5 K/uL   RBC 2.17 (L) 3.87 - 5.11 MIL/uL   Hemoglobin 6.7 (L) 12.0 - 15.0 g/dL   HCT 47.8 (L) 29.5 - 62.1 %   MCV 94.0 80.0 - 100.0 fL   MCH 30.9 26.0 - 34.0 pg   MCHC 32.8 30.0 - 36.0 g/dL   RDW 30.8 (H) 65.7 - 84.6 %   Platelets 150 150 - 400 K/uL   nRBC 0.0 0.0 - 0.2 %    Comment: Performed at [facility], [address] [place], [place] [address]  Basic metabolic panel     Status: Abnormal   Collection Time: 07/10/23  4:28 AM  Result Value Ref Range   Sodium 141 135 - 145 mmol/L   Potassium 4.7 3.5 - 5.1 mmol/L   Chloride 114 (H) 98 - 111 mmol/L   CO2 20 (L) 22 - 32 mmol/L   Glucose, Bld 127 (H) 70 - 99 mg/dL    Comment: Glucose reference range applies only to samples taken after fasting for at least 8 hours.   BUN 22 8 - 23 mg/dL   Creatinine, Ser 2.84 (H) 0.44 - 1.00 mg/dL   Calcium 8.9 8.9 -  13.2 mg/dL   GFR, Estimated 37 (L) >60 mL/min    Comment: (NOTE) Calculated using the CKD-EPI Creatinine Equation (2021)    Anion gap 7 5 - 15    Comment: Performed at [facility], [address] [place], [place] [address]  Magnesium     Status: None   Collection Time: 07/10/23  4:28 AM  Result Value Ref Range   Magnesium 2.2 1.7 - 2.4 mg/dL    Comment: Performed at [facility], [address] [place], [place] [address]  Vitamin B12     Status: Abnormal   Collection Time: 07/10/23  4:28 AM  Result Value Ref Range   Vitamin B-12 135 (L) 180 - 914 pg/mL    Comment: (NOTE) This assay is not validated for testing neonatal  or myeloproliferative syndrome specimens for Vitamin B12 levels. Performed at [facility], [address], [place], [place] [address]   Prepare RBC (crossmatch)     Status: None   Collection Time: 07/10/23 10:00 AM  Result Value Ref Range   Order Confirmation      DUPLICATE REQUEST Performed at [facility], [address] [place], [place] [address]   Hemoglobin     Status: Abnormal   Collection Time: 07/10/23  4:53 PM  Result Value Ref Range   Hemoglobin 8.3 (L) 12.0 - 15.0 g/dL    Comment: Performed at [facility], [address] [place], [place] [address]    No results found for this or any previous visit (from the past 240 hour(s)).  Lipid Panel Recent Labs    07/09/23 0406  CHOL 132  TRIG 86  HDL 46  CHOLHDL 2.9  VLDL 17  LDLCALC 69    Studies/Results: ECHOCARDIOGRAM COMPLETE  Result Date: 07/09/2023    ECHOCARDIOGRAM REPORT   Patient Name:   Marissa Gilmore Date of Exam: 07/09/2023 Medical Rec #:  [id number]          Height:       64.0 in Accession #:    [id number]         Weight:       128.0 lb Date of Birth:  [birth year]          BSA:          1.618 m Patient Age:    89 years           BP:           101/62 mmHg Patient Gender: F                  HR:           119 bpm. Exam Location:  [facility]  Procedure: 2D Echo, Cardiac Doppler and Color Doppler Indications:     Stroke  History:         Patient has no prior history of Echocardiogram examinations.                  Stroke, Arrythmias:Atrial Fibrillation, Signs/Symptoms:Dyspnea                  and Fatigue; Risk Factors:Hypertension and Dyslipidemia.  Sonographer:     Mikki Harbor Referring Phys:  [id number] Andris Baumann Diagnosing Phys: [name] MD IMPRESSIONS  1. Left ventricular ejection fraction, by estimation, is 55 to 60%. The left ventricle has normal function. The left ventricle has no regional wall motion abnormalities. Left ventricular diastolic function could not be evaluated.  2. Right ventricular systolic function is normal. The right ventricular size is normal. There is moderately elevated pulmonary artery systolic pressure.  3. Left atrial size was mildly dilated.  4. The mitral valve is degenerative. Mild to moderate mitral valve regurgitation.  5. Tricuspid valve regurgitation is moderate.  6. The aortic valve is normal in structure. Aortic valve regurgitation is trivial. FINDINGS  Left Ventricle: Left ventricular ejection fraction, by estimation, is 55 to 60%. The left ventricle has normal function. The left ventricle has no regional wall motion abnormalities. The left ventricular internal cavity size was normal in size. There is  no left ventricular hypertrophy. Left ventricular diastolic function could not be evaluated. Right Ventricle: The right ventricular size is normal. No increase in right ventricular wall thickness. Right ventricular systolic function is normal. There  is moderately elevated pulmonary artery systolic pressure. The tricuspid regurgitant velocity is 3.32 m/s, and with an assumed right atrial pressure of 8 mmHg, the estimated right ventricular systolic pressure is 52.1 mmHg. Left Atrium: Left atrial size was mildly dilated. Right Atrium: Right atrial size was normal in size. Pericardium: There is no  evidence of pericardial effusion. Mitral Valve: Eccentric posterior jet. The mitral valve is degenerative in appearance. Mild to moderate mitral valve regurgitation. MV peak gradient, 4.0 mmHg. The mean mitral valve gradient is 2.0 mmHg. Tricuspid Valve: Eccentric posterior jet. The tricuspid valve is grossly normal. Tricuspid valve regurgitation is moderate. Aortic Valve: The aortic valve is normal in structure. Aortic valve regurgitation is trivial. Aortic valve mean gradient measures 5.7 mmHg. Aortic valve peak gradient measures 11.9 mmHg. Aortic valve area, by VTI measures 1.57 cm. Pulmonic Valve: The pulmonic valve was normal in structure. Pulmonic valve regurgitation is not visualized. Aorta: The ascending aorta was not well visualized. IAS/Shunts: No atrial level shunt detected by color flow Doppler.  LEFT VENTRICLE PLAX 2D LVIDd:         4.40 cm LVIDs:         3.10 cm LV PW:         1.10 cm LV IVS:        0.80 cm LVOT diam:     2.00 cm LV SV:         50 LV SV Index:   31 LVOT Area:     3.14 cm  RIGHT VENTRICLE RV Basal diam:  3.60 cm RV Mid diam:    2.70 cm RV S prime:     13.40 cm/s LEFT ATRIUM             Index        RIGHT ATRIUM           Index LA diam:        4.30 cm 2.66 cm/m   RA Area:     22.30 cm LA Vol (A2C):   93.7 ml 57.90 ml/m  RA Volume:   64.70 ml  39.98 ml/m LA Vol (A4C):   78.7 ml 48.63 ml/m LA Biplane Vol: 85.8 ml 53.02 ml/m  AORTIC VALVE                     PULMONIC VALVE AV Area (Vmax):    1.64 cm      PV Vmax:       1.32 m/s AV Area (Vmean):   1.44 cm      PV Peak grad:  7.0 mmHg AV Area (VTI):     1.57 cm AV Vmax:           172.67 cm/s AV Vmean:          110.000 cm/s AV VTI:            0.319 m AV Peak Grad:      11.9 mmHg AV Mean Grad:      5.7 mmHg LVOT Vmax:         90.20 cm/s LVOT Vmean:        50.450 cm/s LVOT VTI:          0.159 m LVOT/AV VTI ratio: 0.50  AORTA Ao Root diam: 3.00 cm MITRAL VALVE               TRICUSPID VALVE MV Area (PHT): 5.62 cm    TR Peak grad:    44.1 mmHg MV Area VTI:  2.93 cm    TR Vmax:        332.00 cm/s MV Peak grad:  4.0 mmHg MV Mean grad:  2.0 mmHg    SHUNTS MV Vmax:       1.00 m/s    Systemic VTI:  0.16 m MV Vmean:      58.3 cm/s   Systemic Diam: 2.00 cm MV Decel Time: 135 msec MV E velocity: 91.70 cm/s [name] MD Electronically signed by [name] MD Signature Date/Time: 07/09/2023/1:15:37 PM    Final    MR BRAIN WO CONTRAST  Addendum Date: 07/09/2023   ADDENDUM REPORT: 07/09/2023 03:11 ADDENDUM: In addition to the initially described findings, note is made of a 9 mm T1 hypointense lesion within the C4 vertebral body, indeterminate. This is described in the body of the report, omitted from the impression by mistake. Further evaluation with dedicated MRI of the cervical spine, with and without contrast, suggested for further evaluation as warranted. Electronically Signed   By: Rise Mu M.D.   On: 07/09/2023 03:11   Result Date: 07/09/2023 CLINICAL DATA:  Initial evaluation for neuro deficit, stroke suspected. Left-sided deficit. EXAM: MRI HEAD WITHOUT CONTRAST MRA HEAD WITHOUT CONTRAST TECHNIQUE: Multiplanar, multi-echo pulse sequences of the brain and surrounding structures were acquired without intravenous contrast. Angiographic images of the Circle of Willis were acquired using MRA technique without intravenous contrast. COMPARISON:  Comparison made with prior CT from 07/08/2023. FINDINGS: MRI HEAD FINDINGS Brain: Cerebral volume within normal limits. Minimal hazy FLAIR signal abnormality noted involving the periventricular [place] matter, likely related to chronic microvascular ischemic disease, minimal in nature and less than is typically seen for age. Tiny remote infarct present at the right occipital lobe (series 20, image 21). Additional tiny remote left cerebellar infarct noted. Subtle focus of diffusion signal abnormality measuring approximately 1.5 cm seen at the right frontal corona radiata (series 10,  image 78). Associated subtle signal loss on ADC map (series 11, image 30). Findings suspicious for a possible acute ischemic infarct. No associated hemorrhage or mass effect. No other evidence for acute or subacute ischemia. Gray-Apsey matter differentiation otherwise maintained. No areas of chronic cortical infarction. No significant acute or chronic intracranial blood products. No mass lesion, midline shift or mass effect. No hydrocephalus or extra-axial fluid collection. Pituitary gland and suprasellar region within normal limits. Vascular: Major intracranial vascular flow voids are maintained. Skull and upper cervical spine: Craniocervical junction within normal limits. 9 mm T1 hypointense lesion noted within the visualized C4 vertebral body (series 14, image 12), indeterminate. Bone marrow signal intensity otherwise normal. No scalp soft tissue abnormality. Sinuses/Orbits: Prior ocular lens replacement on the right. Globes and orbital soft tissues otherwise unremarkable. Paranasal sinuses are largely clear. No significant mastoid effusion. Other: None. MRA HEAD FINDINGS Anterior circulation: Visualized distal cervical segments of the internal carotid arteries are mildly tortuous but patent with antegrade flow. Petrous, cavernous, and supraclinoid segments patent. Left A1 segment widely patent. Focal severe right A1 stenosis (series 25956, image 8). Normal anterior communicating artery complex. Anterior cerebral arteries patent without stenosis. No M1 stenosis or occlusion. Left MCA branches well perfused. On the right, there is acute occlusion of a proximal right M2 branch, superior division (series 38756, image 13). Inferior division remains patent and perfused, although a probable moderate proximal right M2 stenosis noted (series 43329, image 12). Posterior circulation: Both vertebral arteries are patent to the vertebrobasilar junction without visible stenosis. Right PICA patent. Left PICA not well seen.  Basilar patent without stenosis. [place] cerebral arteries patent bilaterally. Both PCAs primarily supplied via the basilar. Focal moderate left P2 stenosis (series 1021, image 2). Left PCA otherwise patent. On the right, there is a severe distal right P2 stenosis (series 1021, image 7). Right PCA is markedly attenuated distally and only faintly visible on 3D time-of-flight sequence. Anatomic variants: None significant.  No aneurysm. IMPRESSION: MRI HEAD IMPRESSION: 1. Subtle 1.5 cm focus of diffusion signal abnormality involving the right frontal corona radiata, suspicious for an acute ischemic infarct. No associated hemorrhage or mass effect. 2. 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 MRA HEAD IMPRESSION: 1. Acute occlusion of a proximal right M2 branch, superior division. 2. Additional moderate proximal right M2 stenosis, inferior division. 3. Severe distal right P2 stenosis, with additional moderate left P2 stenosis. 4. Focal severe right A1 stenosis. Electronically Signed: By: Rise Mu M.D. On: 07/09/2023 03:05   MR ANGIO HEAD WO CONTRAST  Addendum Date: 07/09/2023   ADDENDUM REPORT: 07/09/2023 03:11 ADDENDUM: In addition to the initially described findings, note is made of a 9 mm T1 hypointense lesion within the C4 vertebral body, indeterminate. This is described in the body of the report, omitted from the impression by mistake. Further evaluation with dedicated MRI of the cervical spine, with and without contrast, suggested for further evaluation as warranted. Electronically Signed   By: Rise Mu M.D.   On: 07/09/2023 03:11   Result Date: 07/09/2023 CLINICAL DATA:  Initial evaluation for neuro deficit, stroke suspected. Left-sided deficit. EXAM: MRI HEAD WITHOUT CONTRAST MRA HEAD WITHOUT CONTRAST TECHNIQUE: Multiplanar, multi-echo pulse sequences of the brain and surrounding structures were acquired  without intravenous contrast. Angiographic images of the Circle of Willis were acquired using MRA technique without intravenous contrast. COMPARISON:  Comparison made with prior CT from 07/08/2023. FINDINGS: MRI HEAD FINDINGS Brain: Cerebral volume within normal limits. Minimal hazy FLAIR signal abnormality noted involving the periventricular [place] matter, likely related to chronic microvascular ischemic disease, minimal in nature and less than is typically seen for age. Tiny remote infarct present at the right occipital lobe (series 20, image 21). Additional tiny remote left cerebellar infarct noted. Subtle focus of diffusion signal abnormality measuring approximately 1.5 cm seen at the right frontal corona radiata (series 10, image 78). Associated subtle signal loss on ADC map (series 11, image 30). Findings suspicious for a possible acute ischemic infarct. No associated hemorrhage or mass effect. No other evidence for acute or subacute ischemia. Gray-[place] matter differentiation otherwise maintained. No areas of chronic cortical infarction. No significant acute or chronic intracranial blood products. No mass lesion, midline shift or mass effect. No hydrocephalus or extra-axial fluid collection. Pituitary gland and suprasellar region within normal limits. Vascular: Major intracranial vascular flow voids are maintained. Skull and upper cervical spine: Craniocervical junction within normal limits. 9 mm T1 hypointense lesion noted within the visualized C4 vertebral body (series 14, image 12), indeterminate. Bone marrow signal intensity otherwise normal. No scalp soft tissue abnormality. Sinuses/Orbits: Prior ocular lens replacement on the right. Globes and orbital soft tissues otherwise unremarkable. Paranasal sinuses are largely clear. No significant mastoid effusion. Other: None. MRA HEAD FINDINGS Anterior circulation: Visualized distal cervical segments of the internal carotid arteries are mildly tortuous but  patent with antegrade flow. Petrous, cavernous, and supraclinoid segments patent. Left A1 segment widely patent. Focal severe right A1 stenosis (series 57846, image 8). Normal anterior communicating artery complex. Anterior cerebral arteries patent without stenosis. No  M1 stenosis or occlusion. Left MCA branches well perfused. On the right, there is acute occlusion of a proximal right M2 branch, superior division (series 28413, image 13). Inferior division remains patent and perfused, although a probable moderate proximal right M2 stenosis noted (series 24401, image 12). Posterior circulation: Both vertebral arteries are patent to the vertebrobasilar junction without visible stenosis. Right PICA patent. Left PICA not well seen. Basilar patent without stenosis. [place] cerebral arteries patent bilaterally. Both PCAs primarily supplied via the basilar. Focal moderate left P2 stenosis (series 1021, image 2). Left PCA otherwise patent. On the right, there is a severe distal right P2 stenosis (series 1021, image 7). Right PCA is markedly attenuated distally and only faintly visible on 3D time-of-flight sequence. Anatomic variants: None significant.  No aneurysm. IMPRESSION: MRI HEAD IMPRESSION: 1. Subtle 1.5 cm focus of diffusion signal abnormality involving the right frontal corona radiata, suspicious for an acute ischemic infarct. No associated hemorrhage or mass effect. 2. 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 MRA HEAD IMPRESSION: 1. Acute occlusion of a proximal right M2 branch, superior division. 2. Additional moderate proximal right M2 stenosis, inferior division. 3. Severe distal right P2 stenosis, with additional moderate left P2 stenosis. 4. Focal severe right A1 stenosis. Electronically Signed: By: Rise Mu M.D. On: 07/09/2023 03:05    Medications: Scheduled:  aspirin EC  81 mg Oral Daily   atorvastatin  40 mg Oral  Daily   diltiazem  30 mg Oral Q8H   medroxyPROGESTERone  20 mg Oral TID   metoprolol tartrate  25 mg Oral BID   sodium bicarbonate  650 mg Oral BID   Continuous:  sodium chloride 50 mL/hr at 07/10/23 1049   Assessment:  89 y.o. female with a PMHx of chronic atrial fibrillation on Eliquis, who initially presented to the ED for assessment of vaginal bleeding. She was placed on a cardizem drip for RVR but then at 18:45 PM she became hypotensive and weak on the left side with left facial droop and dysarthria. Code stroke was then called. CT head was negative for acute process. Patient was evaluated by Teleneurology and she was not deemed to be a candidate for TNK due to anticoagulation. NIHSS was 2 with Teleneurology. Due to low suspicion for LVO and current AKI seen on workup, CTA was not recommended by Teleneurology. MRI confirmed acute faint restricted diffusion in the corona radiata on the right, within the MCA territory, as well as right M2 acute occlusion. As she continued to have a low NIHSS, and in the setting of her AKI, thrombectomy continued not to be a good option from a risk/benefit standpoint at the time of MRI/MRA completion. After Hospitalist discussion with ED provider, decision was made to administer Kcentra for Eliquis reversal for her vaginal bleeding.   - Exam today (Thursday) reveals left facial droop, LUE weakness and severely dysarthric speech that is dysfluent with a stuttering quality. However, naming, comprehension and repetition continue to be intact in the context of the halting speech and dysarthria. She is fully oriented.    - EKG in the ED showed A-fib with a heart rate of 126 - MRI brain: Subtle 1.5 cm focus of faint diffusion signal abnormality involving the right frontal corona radiata, suspicious for an acute ischemic infarct. No associated hemorrhage or mass effect. Underlying minor chronic microvascular ischemic disease with tiny remote right occipital and left  cerebellar infarcts.  - MRA head: Acute occlusion of a  proximal right M2 branch, superior division. Additional moderate proximal right M2 stenosis, inferior division. Severe distal right P2 stenosis, with additional moderate left P2 stenosis. Focal severe right A1 stenosis. - TTE: LVEF 55 to 60%. The left ventricle has normal function. The left ventricle has no regional  wall motion abnormalities. Left atrial size was mildly dilated. No valvular vegetation or mural thrombus mentioned in the report.  - B12 is low at 135, consistent with deficiency      Recommendations: - Carotid ultrasound.  - Unrelated to her acute presentation is a 9 mm T1 hypointense lesion within the C4 vertebral body. Radiology recommends further imaging with dedicated MRI of the cervical spine, with and without contrast.  - Cardiac telemetry - Regarding anticoagulation, will need to continue to hold until the source of her vaginal bleeding is diagnosed and fully treated.  - Agree with ASA for now to provide some stroke prophylaxis in the context of her Eliquis having been stopped due to heavy vaginal bleeding. Will defer to primary team and GYN if ASA needs to temporarily be held for continued bleeding or a procedure. - Would restart Eliquis once cleared by GYN. Once Eliquis is restarted, would discontinue ASA.  - Permissive HTN - Frequent neuro checks - PT/OT/Speech - Per GYN, continue progesterone 200 mg daily. They feel that there is not much they can do for her at this time except to keep her on progesterone and for her to follow up with GYN as an outpatient to talk about evaluation of a thickened stripe and PMB. Additional inpatient management consists of serial H&H's and transfuse as necessary. - Frequent neuro checks - NPO until passes stroke swallow screen - Starting vitamin B12 at 2000 mcg po every day. Continue outpatient. Will need repeat level in 3 months.  - MRI of cervical spine without contrast is  recommended (has low eGFR) to further assess the 9 mm T1 hypointense lesion within the C4 vertebral body that was imaged on brain MRI at low resolution.      LOS: 1 day   @Electronically  signed: Dr. Caryl Pina 07/10/2023  10:16 PM

## 2023-07-10 NOTE — Progress Notes (Signed)
Pt's bleeding has slowed down at this time with minimal clots noted.   HGB is 6.7 this morning . Dr Arville Care notified no new orders given except to consult with MD at 7. Pt resting at this time.

## 2023-07-10 NOTE — Progress Notes (Signed)
Progress Note   Patient: Marissa Gilmore QIO:[id number] DOB: [birth year] DOA: 07/08/2023     1 DOS: the patient was seen and examined on 07/10/2023   Brief hospital course: Albertina Piper is a 89 y.o. female with medical history significant for Chronic atrial fibrillation on Eliquis and amiodarone, systolic CHF, HTN and HLD who presented to the ED initially with a 2-day history of persistent vaginal bleeding including passage of golf ball size clots.  While in the emergency room, patient developed atrial fibrillation with RVR, then subsequently she developed left-sided facial droop and slurred speech. MRI brain showed subtle 1.5 cm focus of diffusion signal abnormality involving the right frontal corona radiata, suspicious for an acute ischemic infarct. MRA head: 1. Acute occlusion of a proximal right M2 branch, superior division. 2. Additional moderate proximal right M2 stenosis, inferior division. 3. Severe distal right P2 stenosis, with additional moderate left P2 stenosis. 4. Focal severe right A1 stenosis. Patient is seen by neurology, due to large amount of vaginal bleeding, anticoagulation was not able to start. Patient continued to have vaginal bleeding while in the hospital, not able to start anticoagulation.   Principal Problem:   Acute CVA (cerebrovascular accident) (HCC) Active Problems:   ABLA (acute blood loss anemia)   Hypotension   AKI (acute kidney injury) (HCC)   Chronic systolic (congestive) heart failure (HCC)   Chronic anticoagulation   Post-menopausal bleeding   Atrial fibrillation with rapid ventricular response (HCC)   Acute arterial ischemic stroke, multifocal, anterior circulation, right (HCC)   Assessment and Plan: * Acute CVA (cerebrovascular accident) [facility]) Patient had a single stroke in the right ACA distribution.  Given multiple intracranial arterial obstructions, this most likely ischemic stroke.  However, due to large vaginal bleeding, not able to  start antiplatelet antiplatelets or Eliquis. Continue statin. Patient is a followed by neurology, patient appeared to have more aphasia today.   ABLA (acute blood loss anemia) Postmenopausal bleeding Being transfused in the ED Serial H&H thereafter and transfuse as necessary Continue progesterone 200 mg daily as recommended by GYN Per Dr Schermerhorn:Not much I can do for her at this except to keep  her on progesterone and for her to follow up with me as an outpatient to talk about evaluation of a thickened stripe and PMB   Patient had more vaginal bleeding today, required another units of PRBC.  Have reached out to OB/GYN, per Dr. Jean Rosenthal, there is nothing he can do other than switch current progesterone to Provera at 20 mg 3 times a day.  Patient probably has a cancer.  Will continue to follow hemoglobin, transfuse 1 unit for hemoglobin of 6.9.    AKI (acute kidney injury) on chronic kidney disease stage IIIa.   Mild metabolic acidosis. Creatinine 2.27 likely related to hypovolemia from blood loss Renal function has improved after giving IV fluids.  Currently on lower dose of normal saline.  Continue to monitor.   Atrial fibrillation with rapid ventricular response (HCC) Hypotension. Not able to start anticoagulation due to large vaginal bleeding. Blood pressure seem to be better after initial fluids.   Patient also received a 0.5 mg of digoxin on 9/4 diltiazem 30 mg every 8 hours for RVR. Patient heart rate seems to be better.   Chronic systolic (congestive) heart failure (HCC) EF 30% 2021 Clinically euvolemic Holding torsemide, spironolactone, Entresto, metoprolol to allow for permissive hypertension   Nonsustained VT. Patient had 5 beats of VT, will monitor closely, correct electrolytes abnormalities.  CODE STATUS. Patient still clear minded, with the presence of her friend to verify, patient now wanted to not resuscitate status. Due to advanced age, multiple medical  problems, patient long-term prognosis is poor.  Obtain palliative care consult.     Subjective:  Patient still has significant vaginal bleeding, she also seem to have worsening aphasia.  Physical Exam: Vitals:   07/10/23 0440 07/10/23 0847 07/10/23 1108 07/10/23 1126  BP: (!) 131/97 (!) 135/49 (!) 134/47 (!) 127/50  Pulse: 96 93 62 (!) 108  Resp: 19 20 18 18   Temp: 97.8 F (36.6 C) 97.9 F (36.6 C) 97.9 F (36.6 C) 98.6 F (37 C)  TempSrc: Oral Oral  Oral  SpO2: 100% 99% 100%   Weight:      Height:       General exam: Appears calm and comfortable  Respiratory system: Clear to auscultation. Respiratory effort normal. Cardiovascular system: Irregularly irregular. No JVD, murmurs, rubs, gallops or clicks. No pedal edema. Gastrointestinal system: Abdomen is nondistended, soft and nontender. No organomegaly or masses felt. Normal bowel sounds heard. Central nervous system: Alert and oriented x3.  Aphasia Extremities: Symmetric 5 x 5 power. Skin: No rashes, lesions or ulcers Psychiatry:Mood & affect appropriate.    Data Reviewed:  Lab results reviewed.  Family Communication: Friend at the bedside.  Disposition: Status is: Inpatient Remains inpatient appropriate because: Severity of disease, IV treatment.     Time spent: 50 minutes  Author: Marrion Coy, MD 07/10/2023 1:56 PM  For on call review [email or web address].

## 2023-07-10 NOTE — Consult Note (Signed)
Consultation Note Date: 07/10/2023 at 1430  Patient Name: Marissa Gilmore  DOB: 12-30-1930  MRN: [id number]  Age / Sex: 89 y.o., female  PCP: Erasmo Downer, MD Referring Physician: Marrion Coy, MD  Reason for Consultation: Establishing goals of care  HPI/Patient Profile: 89 y.o. female  with past medical history of chronic A-fib (Eliquis and amiodarone), systolic CHF, HTN, and HLD admitted on 07/08/2023 with persistent abnormal vaginal bleeding with passage of golf ball size clots.  While in the ER, patient developed A-fib with RVR and subsequent left-sided facial droop with slurred speech.  MRI brain revealed 1.5 cm focus of diffusion signal abnormality involving the right frontal corona radiata, suspicious for an acute ischemic infarct.   MRA head revealed" 1. Acute occlusion of a proximal right M2 branch, superior division. 2. Additional moderate proximal right M2 stenosis, inferior division. 3. Severe distal right P2 stenosis, with additional moderate left P2 stenosis. 4. Focal severe right A1 stenosis.  Patient is seen by neurology, due to large amount of vaginal bleeding, anticoagulation was not able to start.  Patient continued to have vaginal bleeding while in the hospital, not able to start anticoagulation.  OB/GYN was consulted and has recommended Provera 20 mg 3 times a day.  Patient likely has cancer and will need to follow-up as outpatient to discuss evaluation of thickened stripe and PMB.  PMT was consulted to discuss goals of care.  Clinical Assessment and Goals of Care: I have reviewed medical records including EPIC notes, labs and imaging, assessed the patient and then met with patient and her longtime friend Clerance Lav at bedside to discuss diagnosis prognosis, GOC, EOL wishes, disposition and options.  Patient's speech is slurred and somewhat garbled but she is fully alert and  oriented x 4, able to make medical decisions for herself at full capacity.   I introduced Palliative Medicine as specialized medical care for people living with serious illness. It focuses on providing relief from the symptoms and stress of a serious illness. The goal is to improve quality of life for both the patient and the family.  We discussed a brief life review of the patient.  Patient is from [place], [place].  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  She had 1 brother who is deceased.  She has 1 living relative, and niece, who lives in [place] and is traveling to [place] (should be here by tomorrow 9/6).  As far as functional and nutritional status PTA patient was independent with ADLs.  We discussed patient's current illness and what it means in the larger context of patient's on-going co-morbidities.  Education provided on post menopasual bleeding, CVA, dysphagia and A-fib with RVR.  Patient and friend giving space and time to ask questions.   I attempted to elicit values and goals of care important to the patient.  Patient and friend endorse patient has agreed to DNR and DNI status.  Additionally, patient and friend share that while medication is treating the bleeding  the source has not been determined.  Patient would likely require a full hysterectomy, which they understand would not to be a medically appropriate/safe option at this point.    3 flee discussed advanced care planning and boundaries of medical care.  However, SLP came to evaluate the patient.  Importance of nutritional intake and safe swallowing reviewed.  I agree with SLP's recommendation of Magic cup and Ensure.  Orders placed.  PMT will continue to follow and support patient throughout her hospitalization.  She was in agreement to having return to further discuss goals and boundaries of care tomorrow.  Primary Decision Maker PATIENT  Physical Exam Vitals reviewed.   Constitutional:      General: She is not in acute distress.    Appearance: She is normal weight.  HENT:     Mouth/Throat:     Comments: Left side facial droop Pulmonary:     Effort: Pulmonary effort is normal.  Abdominal:     Palpations: Abdomen is soft.  Musculoskeletal:     Comments: LUE weakness  Skin:    General: Skin is warm and dry.  Neurological:     Mental Status: She is alert and oriented to person, place, and time.  Psychiatric:        Mood and Affect: Mood normal.        Behavior: Behavior normal.        Thought Content: Thought content normal.        Judgment: Judgment normal.     Palliative Assessment/Data: 40%     Thank you for this consult. Palliative medicine will continue to follow and assist holistically.   Time Total: 75 minutes  Signed by: Georgiann Cocker, DNP, FNP-BC Palliative Medicine    Please contact Palliative Medicine Team phone at [phone] for questions and concerns.  For individual provider: See Loretha Stapler

## 2023-07-10 NOTE — Progress Notes (Signed)
Speech Language Pathology Treatment: Dysphagia  Patient Details Name: Marissa Gilmore MRN: [id number] DOB: Sep 22, 1931 Today's Date: 07/10/2023 Time: 4098-1191 SLP Time Calculation (min) (ACUTE ONLY): 45 min  Assessment / Plan / Recommendation Clinical Impression  Pt seen at bedside for follow up after BSE completed yesterday. Visitor and NP present upon arrival of SLP. Pt reported to have poor PO intake, with difficulty chewing Dys2 textures, left pocketing, and anterior leakage. SLP set suction up at bedside to facilitate thorough oral care and assist with clearance of left side pocketing. Pt was seated in upright position with pillow to correct leaning to the left. Pt completed oral care after set up. Her alertness was noted to wax and wane throughout this session.  Pt was encouraged to hold her head neutral to slightly tilted back to facilitate posterior propulsion (she tends to look down toward her lap). Upright position in bed with her head on the pillow was mildly successful.   Following oral care, pt accepted trials of ice chips, which repeatedly spilled from the left side of her mouth. Trial of nectar thick liquid via straw placed on the right side was unsuccessful - pt unable to pull NTL through the straw (in therapy, cutting straw in half may be beneficial). Teaspoon boluses of nectar thick liquid were inconsistently contained orally, with left anterior leakage on half of the boluses presented. Half teaspoon boluses of puree were contained orally without anterior leakage, however, despite multiple swallows and encouragement to use lingual sweep, a large amount of pudding/secretions was suctioned from pt's oral cavity several minutes later. At this time, downgrading diet to dys1 with nectar thick liquids may be more manageable, however, it is highly doubtful that she is going to meet her nutrition/hydration needs by mouth at this time.   Re: dysarthria, pt continues to be difficult to  understand, and has difficulty utilizing strategy of making big movements with lips and tongue, and speaking at a slow rate (one word at a time). SLP educated pt on why her speech is slurred and how she can facilitate better intelligibility. Visitor indicated she would encourage use of strategies. As able, visitor was encouraged to help pt with simple oral motor exercises to strengthen lips and tongue.   SLP will continue to follow to assess diet tolerance and complete dysphagia/dysarthria treatment.  Recommend dietician consult, and magic cup with meals. Team notified.    HPI HPI: Pt is a 89 year old female with a history of CHF, HTN, paroxysmal atrial fibrillation on Eliquis who presented to the ED this morning with Afib with RVR and reports of bleeding.  Patient reports that for the past 2 days she has been dealing with persistent vaginal bleeding and passing clots about the size of a golf ball. She was placed on a cardizem drip but then at 18:45 she became hypotensive and weak on the Left side. Neurology has consulted pt and assessed Left sided weakness w/ improvement of her s/s since initial complaint.  Pt and friend stated the s/s "come and go" -- MD and NSG were informed of this and the presence of Left OM weakenss w/ Dysarthria and Dysphagia at the time of this BSE/SLE (1400 on 07/09/2023).   Imaging today: MRI HEAD IMPRESSION:     1. Subtle 1.5 cm focus of diffusion signal abnormality involving the  right frontal corona radiata, suspicious for an acute ischemic  infarct. No associated hemorrhage or mass effect.  2. Underlying minor chronic microvascular ischemic disease with tiny  remote right occipital and left cerebellar infarcts.     MRA HEAD IMPRESSION:     1. Acute occlusion of a proximal right M2 branch, superior division.  2. Additional moderate proximal right M2 stenosis, inferior  division.  3. Severe distal right P2 stenosis, with additional moderate left P2  stenosis.  4. Focal severe right  A1 stenosis.      SLP Plan  Continue with current plan of care      Recommendations for follow up therapy are one component of a multi-disciplinary discharge planning process, led by the attending physician.  Recommendations may be updated based on patient status, additional functional criteria and insurance authorization.    Recommendations  Diet recommendations: Dysphagia 1 (puree);Nectar-thick liquid Liquids provided via: Teaspoon Medication Administration: Crushed with puree Supervision: Full supervision/cueing for compensatory strategies;Trained caregiver to feed patient Compensations: Minimize environmental distractions;Slow rate;Small sips/bites;Other (Comment) (small teaspoon boluses of nectar thick liquid and puree) Postural Changes and/or Swallow Maneuvers: Seated upright 90 degrees;Upright 30-60 min after meal (head neutral to slightly tilted back)                  Oral care before and after PO;Staff/trained caregiver to provide oral care;Oral care QID   Frequent or constant Supervision/Assistance Dysphagia, oral phase (R13.11);Dysarthria and anarthria (R47.1)     Continue with current plan of care    Marissa Gilmore B. Murvin Natal, [facility], CCC-SLP Speech Language Pathologist  Marissa Gilmore 07/10/2023, 4:03 PM

## 2023-07-10 NOTE — Plan of Care (Signed)
  Problem: Education: Goal: Knowledge of disease or condition will improve Outcome: Progressing Goal: Knowledge of secondary prevention will improve (MUST DOCUMENT ALL) Outcome: Progressing Goal: Knowledge of patient specific risk factors will improve Loraine Leriche N/A or DELETE if not current risk factor) Outcome: Progressing   Problem: Ischemic Stroke/TIA Tissue Perfusion: Goal: Complications of ischemic stroke/TIA will be minimized Outcome: Progressing   Problem: Health Behavior/Discharge Planning: Goal: Goals will be collaboratively established with patient/family Outcome: Progressing   Problem: Self-Care: Goal: Ability to participate in self-care as condition permits will improve Outcome: Progressing   Problem: Nutrition: Goal: Risk of aspiration will decrease Outcome: Progressing

## 2023-07-10 NOTE — Evaluation (Signed)
Physical Therapy Evaluation Patient Details Name: Marissa Gilmore MRN: [id number] DOB: 12-Jan-1931 Today's Date: 07/10/2023  History of Present Illness  Marissa Gilmore is a 89 y.o. female with medical history significant for Chronic atrial fibrillation on Eliquis and amiodarone, systolic CHF, HTN and HLD who presented to the ED initially with a 2-day history of persistent vaginal bleeding including passage of golf ball size clots.   While in the emergency room, patient developed atrial fibrillation with RVR, then subsequently she developed left-sided facial droop and slurred speech.  MRI brain showed subtle 1.5 cm focus of diffusion signal abnormality involving the right frontal corona radiata, suspicious for an acute ischemic infarct. Patient with L side weakness and aphasia.  Clinical Impression  Patient received in bed, she is apahsic. Able to follow direction, cognitively intact. She is pleasant and agrees to PT assessment. During session her friend arrived to room and is very supportive. Tells me patient is very independent at baseline. Patient required mod A for supine to sit due to left side weakness. Left lateral leaning in sitting requiring assistance to maintain midline. Poor awareness of L UE. L UE is weaker than L LE. Grossly 1/5 in L UE, 3/5 in L LE. Patient lethargic in sitting probably due to low hgb due to vaginal bleeding. She will continue to benefit from skilled PT to improve strength and safety with mobility to improve independence.            If plan is discharge home, recommend the following: Help with stairs or ramp for entrance;Assistance with feeding;Assistance with cooking/housework;Two people to help with walking and/or transfers;Two people to help with bathing/dressing/bathroom   Can travel by private vehicle        Equipment Recommendations Other (comment) (TBD)  Recommendations for Other Services  Rehab consult    Functional Status Assessment Patient has had a  recent decline in their functional status and demonstrates the ability to make significant improvements in function in a reasonable and predictable amount of time.     Precautions / Restrictions Precautions Precautions: Fall Precaution Comments: Low Hgb from vaginal bleeding, L Hemi Restrictions Weight Bearing Restrictions: No      Mobility  Bed Mobility Overal bed mobility: Needs Assistance Bed Mobility: Supine to Sit     Supine to sit: Mod assist, Used rails Sit to supine: Max assist, +2 for physical assistance, Used rails   General bed mobility comments: patient required min/mod A for supine to sit. She demonstrates decreased awareness and weakness of L side. Left leaning in sitting.    Transfers                   General transfer comment: unable to safely attempt this session    Ambulation/Gait               General Gait Details: unable  Stairs            Wheelchair Mobility     Tilt Bed    Modified Rankin (Stroke Patients Only)       Balance Overall balance assessment: Needs assistance Sitting-balance support: Feet supported Sitting balance-Leahy Scale: Poor Sitting balance - Comments: left leaning in sitting requires assist for upright and poor awareness of deficits. Postural control: Left lateral lean                                   Pertinent Vitals/Pain Pain Assessment Pain  Assessment: No/denies pain    Home Living Family/patient expects to be discharged to:: Skilled nursing facility Living Arrangements: Alone Available Help at Discharge: Friend(s);Available PRN/intermittently Type of Home: House Home Access: Stairs to enter Entrance Stairs-Rails: Doctor, general practice of Steps: 4   Home Layout: One level Home Equipment: Cane - single point      Prior Function Prior Level of Function : Independent/Modified Independent               ADLs Comments: Pt reports living at home alone without  use of AD. She does not drive and neighbors take her to store for groceries and church. Cooks microwave meals and takes sink bath at baseline.     Extremity/Trunk Assessment   Upper Extremity Assessment Upper Extremity Assessment: Right hand dominant;LUE deficits/detail LUE Deficits / Details: minimal movement of L UE and neglect present, she nodded head that she has sensation in L UE. LUE Coordination: decreased gross motor    Lower Extremity Assessment Lower Extremity Assessment: LLE deficits/detail LLE Deficits / Details: global weakness of L LE, 3/5 strength LLE Coordination: decreased gross motor    Cervical / Trunk Assessment Cervical / Trunk Assessment: Normal  Communication   Communication Communication: Other (comment) (apahasia) Cueing Techniques: Verbal cues;Gestural cues;Tactile cues  Cognition Arousal: Alert Behavior During Therapy: WFL for tasks assessed/performed Overall Cognitive Status: Difficult to assess                                 General Comments: patient apahasic        General Comments      Exercises     Assessment/Plan    PT Assessment Patient needs continued PT services  PT Problem List Decreased strength;Decreased range of motion;Decreased activity tolerance;Decreased balance;Decreased mobility;Decreased safety awareness;Decreased knowledge of use of DME;Impaired tone;Decreased coordination       PT Treatment Interventions DME instruction;Gait training;Stair training;Functional mobility training;Therapeutic activities;Therapeutic exercise;Wheelchair mobility training;Patient/family education;Neuromuscular re-education;Balance training    PT Goals (Current goals can be found in the Care Plan section)  Acute Rehab PT Goals Patient Stated Goal: to return to independence PT Goal Formulation: With patient Time For Goal Achievement: 07/24/23 Potential to Achieve Goals: Fair    Frequency 7X/week     Co-evaluation                AM-PAC PT "6 Clicks" Mobility  Outcome Measure Help needed turning from your back to your side while in a flat bed without using bedrails?: A Lot Help needed moving from lying on your back to sitting on the side of a flat bed without using bedrails?: A Lot Help needed moving to and from a bed to a chair (including a wheelchair)?: Total Help needed standing up from a chair using your arms (e.g., wheelchair or bedside chair)?: Total Help needed to walk in hospital room?: Total Help needed climbing 3-5 steps with a railing? : Total 6 Click Score: 8    End of Session   Activity Tolerance: Patient limited by fatigue Patient left: in bed;with call bell/phone within reach;with bed alarm set Nurse Communication: Mobility status PT Visit Diagnosis: Hemiplegia and hemiparesis;Muscle weakness (generalized) (M62.81);Difficulty in walking, not elsewhere classified (R26.2);Other abnormalities of gait and mobility (R26.89) Hemiplegia - Right/Left: Left Hemiplegia - dominant/non-dominant: Non-dominant Hemiplegia - caused by: Cerebral infarction    Time: 7829-5621 PT Time Calculation (min) (ACUTE ONLY): 26 min   Charges:   PT Evaluation $PT Eval  Moderate Complexity: 1 Mod PT Treatments $Therapeutic Activity: 8-22 mins PT General Charges $$ ACUTE PT VISIT: 1 Visit         Latarra Eagleton, PT, GCS 07/10/23,11:10 AM

## 2023-07-10 NOTE — Progress Notes (Signed)
Dr Freda Munro Zhangnotified of HGB and transfusion orders to be placed.

## 2023-07-10 NOTE — TOC Initial Note (Signed)
Transition of Care [facility]) - Initial/Assessment Note    Patient Details  Name: Marissa Gilmore MRN: [id number] Date of Birth: 1931-10-23  Transition of Care [facility]) CM/SW Contact:    Allena Katz, LCSW Phone Number: 07/10/2023, 11:17 AM  Clinical Narrative:    Pt admitted from home alone with acute CVA. Pt is recommended for AIR. CIR to evaluate. CSW will speak with patient when she is able to converse more if not wanting AIR.            Expected Discharge Plan: Skilled Nursing Facility     Patient Goals and CMS Choice            Expected Discharge Plan and Services       Living arrangements for the past 2 months: Single Family Home                                      Prior Living Arrangements/Services Living arrangements for the past 2 months: Single Family Home Lives with:: Self Patient language and need for interpreter reviewed:: Yes Do you feel safe going back to the place where you live?: Yes      Need for Family Participation in Patient Care: No (Comment) Care giver support system in place?: No (comment)   Criminal Activity/Legal Involvement Pertinent to Current Situation/Hospitalization: No - Comment as needed  Activities of Daily Living Home Assistive Devices/Equipment: None ADL Screening (condition at time of admission) Patient's cognitive ability adequate to safely complete daily activities?: Yes Is the patient deaf or have difficulty hearing?: No Does the patient have difficulty seeing, even when wearing glasses/contacts?: No Does the patient have difficulty concentrating, remembering, or making decisions?: No Patient able to express need for assistance with ADLs?: Yes Does the patient have difficulty dressing or bathing?: Yes Independently performs ADLs?: No Communication: Independent Dressing (OT): Needs assistance Is this a change from baseline?: Change from baseline, expected to last <3days Grooming: Needs assistance Is this a change  from baseline?: Change from baseline, expected to last <3 days Feeding: Needs assistance Is this a change from baseline?: Change from baseline, expected to last <3 days Bathing: Needs assistance Is this a change from baseline?: Change from baseline, expected to last <3 days Toileting: Needs assistance Is this a change from baseline?: Change from baseline, expected to last <3 days In/Out Bed: Needs assistance Is this a change from baseline?: Change from baseline, expected to last <3 days Walks in Home: Needs assistance Is this a change from baseline?: Change from baseline, expected to last <3 days Does the patient have difficulty walking or climbing stairs?: Yes Weakness of Legs: Both Weakness of Arms/Hands: None  Permission Sought/Granted                  Emotional Assessment       Orientation: : Fluctuating Orientation (Suspected and/or reported Sundowners) Alcohol / Substance Use: Never Used    Admission diagnosis:  Vaginal bleeding [N93.9] CVA (cerebral vascular accident) (HCC) [I63.9] Atrial fibrillation with RVR (HCC) [I48.91] Acute CVA (cerebrovascular accident) (HCC) [I63.9] Cerebrovascular accident (CVA), unspecified mechanism (HCC) [I63.9] Patient Active Problem List   Diagnosis Date Noted   Acute CVA (cerebrovascular accident) (HCC) 07/09/2023   ABLA (acute blood loss anemia) 07/09/2023   Post-menopausal bleeding 07/09/2023   Atrial fibrillation with rapid ventricular response (HCC) 07/09/2023   Hypotension 07/09/2023   AKI (acute kidney injury) (HCC) 07/09/2023  Acute arterial ischemic stroke, multifocal, anterior circulation, right (HCC) 07/09/2023   Atherosclerosis of native arteries of extremity with intermittent claudication (HCC) 04/14/2023   Hypertensive heart disease with heart failure (HCC) 09/11/2020   Peripheral edema 09/11/2020   Chronic systolic (congestive) heart failure (HCC) 03/09/2020   Paroxysmal atrial fibrillation (HCC) 03/09/2020    Chronic anticoagulation 03/09/2020   Bilateral lower extremity edema 11/23/2019   Dyspnea on exertion 11/23/2019   Mixed hyperlipidemia 09/02/2019   Prediabetes 09/02/2019   Osteoarthritis 11/14/2017   Allergic rhinitis 10/03/2017   Hypertension 08/22/2017   Fatigue 08/22/2017   PCP:  Erasmo Downer, MD Pharmacy:   [facility] [phone] [place], [place] - [address] [place] [address] Phone: [phone] Fax: [phone]     Social Determinants of Health (SDOH) Social History: SDOH Screenings   Food Insecurity: No Food Insecurity (07/09/2023)  Housing: Low Risk  (07/09/2023)  Transportation Needs: No Transportation Needs (07/09/2023)  Utilities: Not At Risk (07/09/2023)  Alcohol Screen: Low Risk  (09/16/2022)  Depression (PHQ2-9): Low Risk  (09/16/2022)  Financial Resource Strain: Low Risk  (09/16/2022)  Physical Activity: Insufficiently Active (09/16/2022)  Social Connections: Moderately Isolated (09/16/2022)  Stress: No Stress Concern Present (09/16/2022)  Tobacco Use: Low Risk  (07/09/2023)   SDOH Interventions:     Readmission Risk Interventions     No data to display

## 2023-07-11 ENCOUNTER — Inpatient Hospital Stay: Payer: Medicare Other

## 2023-07-11 DIAGNOSIS — E440 Moderate protein-calorie malnutrition: Secondary | ICD-10-CM | POA: Insufficient documentation

## 2023-07-11 DIAGNOSIS — N950 Postmenopausal bleeding: Secondary | ICD-10-CM | POA: Diagnosis not present

## 2023-07-11 DIAGNOSIS — D519 Vitamin B12 deficiency anemia, unspecified: Secondary | ICD-10-CM | POA: Insufficient documentation

## 2023-07-11 DIAGNOSIS — I4891 Unspecified atrial fibrillation: Secondary | ICD-10-CM | POA: Diagnosis not present

## 2023-07-11 DIAGNOSIS — N939 Abnormal uterine and vaginal bleeding, unspecified: Secondary | ICD-10-CM | POA: Diagnosis not present

## 2023-07-11 DIAGNOSIS — D62 Acute posthemorrhagic anemia: Secondary | ICD-10-CM | POA: Diagnosis not present

## 2023-07-11 DIAGNOSIS — I639 Cerebral infarction, unspecified: Secondary | ICD-10-CM | POA: Diagnosis not present

## 2023-07-11 DIAGNOSIS — Z515 Encounter for palliative care: Secondary | ICD-10-CM | POA: Diagnosis not present

## 2023-07-11 LAB — CBC
HCT: 19.2 % — ABNORMAL LOW (ref 36.0–46.0)
Hemoglobin: 6.5 g/dL — ABNORMAL LOW (ref 12.0–15.0)
MCH: 31 pg (ref 26.0–34.0)
MCHC: 33.9 g/dL (ref 30.0–36.0)
MCV: 91.4 fL (ref 80.0–100.0)
Platelets: 134 10*3/uL — ABNORMAL LOW (ref 150–400)
RBC: 2.1 MIL/uL — ABNORMAL LOW (ref 3.87–5.11)
RDW: 17.6 % — ABNORMAL HIGH (ref 11.5–15.5)
WBC: 9.1 10*3/uL (ref 4.0–10.5)
nRBC: 0.7 % — ABNORMAL HIGH (ref 0.0–0.2)

## 2023-07-11 LAB — PREPARE RBC (CROSSMATCH)

## 2023-07-11 LAB — BASIC METABOLIC PANEL
Anion gap: 5 (ref 5–15)
BUN: 29 mg/dL — ABNORMAL HIGH (ref 8–23)
CO2: 21 mmol/L — ABNORMAL LOW (ref 22–32)
Calcium: 9 mg/dL (ref 8.9–10.3)
Chloride: 118 mmol/L — ABNORMAL HIGH (ref 98–111)
Creatinine, Ser: 1.57 mg/dL — ABNORMAL HIGH (ref 0.44–1.00)
GFR, Estimated: 31 mL/min — ABNORMAL LOW (ref >60)
Glucose, Bld: 133 mg/dL — ABNORMAL HIGH (ref 70–99)
Potassium: 4.5 mmol/L (ref 3.5–5.1)
Sodium: 144 mmol/L (ref 135–145)

## 2023-07-11 LAB — GLUCOSE, CAPILLARY: Glucose-Capillary: 121 mg/dL — ABNORMAL HIGH (ref 70–99)

## 2023-07-11 LAB — HEMOGLOBIN AND HEMATOCRIT, BLOOD
HCT: 26.3 % — ABNORMAL LOW (ref 36.0–46.0)
Hemoglobin: 8.9 g/dL — ABNORMAL LOW (ref 12.0–15.0)

## 2023-07-11 LAB — MAGNESIUM: Magnesium: 2.6 mg/dL — ABNORMAL HIGH (ref 1.7–2.4)

## 2023-07-11 LAB — PHOSPHORUS: Phosphorus: 3.1 mg/dL (ref 2.5–4.6)

## 2023-07-11 MED ORDER — SODIUM CHLORIDE 0.9 % IV SOLN
300.0000 mg | Freq: Once | INTRAVENOUS | Status: AC
  Administered 2023-07-11: 300 mg via INTRAVENOUS
  Filled 2023-07-11: qty 300

## 2023-07-11 MED ORDER — SODIUM CHLORIDE 0.9% IV SOLUTION: Freq: Once | INTRAVENOUS | Status: AC

## 2023-07-11 MED ORDER — ADULT MULTIVITAMIN W/MINERALS CH
1.0000 | ORAL_TABLET | Freq: Every day | ORAL | Status: DC
  Administered 2023-07-11 – 2023-07-16 (×6): 1 via ORAL
  Filled 2023-07-11 (×6): qty 1

## 2023-07-11 MED ORDER — NEPRO/CARBSTEADY PO LIQD: 237.0000 mL | Freq: Three times a day (TID) | ORAL | Status: DC

## 2023-07-11 NOTE — Progress Notes (Signed)
Palliative Care Progress Note, Assessment & Plan   Patient Name: Marissa Gilmore       Date: 07/11/2023 DOB: Sep 09, 1931  Age: 89 y.o. MRN#: [id number] Attending Physician: Marissa Coy, MD Primary Care Physician: Marissa Downer, MD Admit Date: 07/08/2023  Subjective: Patient is sitting up in bed in no apparent distress.  She acknowledges my presence and is able to make her wishes known.  1 unit of RBCs is infusing.  Her friend Marissa Gilmore is at bedside during my visit.  HPI: 89 y.o. female  with past medical history of chronic A-fib (Eliquis and amiodarone), systolic CHF, HTN, and HLD admitted on 07/08/2023 with persistent abnormal vaginal bleeding with passage of golf ball size clots.   While in the ER, patient developed A-fib with RVR and subsequent left-sided facial droop with slurred speech.   MRI brain revealed 1.5 cm focus of diffusion signal abnormality involving the right frontal corona radiata, suspicious for an acute ischemic infarct.    MRA head revealed" 1. Acute occlusion of a proximal right M2 branch, superior division. 2. Additional moderate proximal right M2 stenosis, inferior division. 3. Severe distal right P2 stenosis, with additional moderate left P2 stenosis. 4. Focal severe right A1 stenosis.   Patient is seen by neurology, due to large amount of vaginal bleeding, anticoagulation was not able to start.   Patient continued to have vaginal bleeding while in the hospital, not able to start anticoagulation.  OB/GYN was consulted and has recommended Provera 20 mg 3 times a day.  Patient likely has cancer and will need to follow-up as outpatient to discuss evaluation of thickened stripe and PMB.   PMT was consulted to discuss goals of care.  Summary of counseling/coordination of  care: After reviewing the patient's chart and assessing the patient at bedside, I spoke with patient and her friend Marissa Gilmore in regards to plan and boundaries of care.  Symptoms assessed.  Patient denies pain or discomfort at this time.  She shares she feels as though she is choking when she takes in food but can past the thickened liquids without difficulty.  No adjustment to more needed at this time.  I attempted to elicit values and goals important to the patient.  Patient has completed an advanced directive naming her friend Marissa Gilmore and is her next of kin decision maker with her niece as the next alternate.  Patient is also outlined that she would never be accepting of a ventilator, permanent feeding tube, CPR/ACLS protocol.  Patient remains in agreement with DNR and DNI. Spiritual care team is following closely for AD completion.   Marissa Gilmore at bedside is very encouraging of patient to accept a temporary feeding tube.  Patient is shaking her head no.  With patient's permission, we discussed the pros and cons of a temporary feeding tube. I also discussed the difference between and termporary and permanenet feeding tube. Pt again confirmed that patient would never be accepting a permanent feeding tube.  She remains hopeful that she can eat/drink enough "naturally" and not need even a temporary feeding tube.  Discussed importance of not only nutrition but functional and cognitive status as important indicators of patient's overall prognosis.  I reiterated to  patient that she has full agency over her medical decision making.  While her friend Marissa Gilmore is saying the patient should get a feeding tube, reminded patient that she has complete autonomy over her medical decisions. I highlighted that naming Marissa Gilmore as her [place] is only enacted when Marissa Gilmore is unable to speak for herself.   Therapeutic silence, active listening, and emotional support provided.  PMT will continue to follow.  Patient and friend are  aware that I am off service until Tuesday.  My colleagues will shadow/monitor the patient peripherally as I plan to follow-up with patient next Tuesday.  Please utilize [facility] for any acute palliative needs until that time.  Of note, once dispo is more clear, I plan to complete a MOST form.  Physical Exam Vitals reviewed.  Constitutional:      General: She is not in acute distress.    Appearance: She is normal weight.  HENT:     Head: Normocephalic.     Mouth/Throat:     Mouth: Mucous membranes are moist.     Comments: Left facial droop Eyes:     Pupils: Pupils are equal, round, and reactive to light.  Cardiovascular:     Rate and Rhythm: Normal rate.     Pulses: Normal pulses.  Pulmonary:     Effort: Pulmonary effort is normal.  Abdominal:     Palpations: Abdomen is soft.  Musculoskeletal:     Comments: Left sided weakness  Skin:    General: Skin is warm and dry.  Neurological:     Mental Status: She is alert and oriented to person, place, and time.  Psychiatric:        Mood and Affect: Mood normal.        Behavior: Behavior normal.        Thought Content: Thought content normal.        Judgment: Judgment normal.             Total Time 35 minutes   Marissa Gilmore L. Bonita Quin, DNP, FNP-BC Palliative Medicine Team

## 2023-07-11 NOTE — Progress Notes (Signed)
Inpatient Rehabilitation Admissions Coordinator   She will need supports after any rehab venue. If not available, CIR will not be pursued for unlikely to reach Mod I level to return home alone with her multiple medical issues and needs.  Ottie Glazier, RN, MSN Rehab Admissions Coordinator [phone] 07/11/2023 11:54 AM

## 2023-07-11 NOTE — Care Management Important Message (Signed)
Important Message  Patient Details  Name: Marissa Gilmore MRN: [id number] Date of Birth: 03-16-1931   Medicare Important Message Given:  N/A - LOS <3 / Initial given by admissions     Johnell Comings 07/11/2023, 8:59 AM

## 2023-07-11 NOTE — Progress Notes (Signed)
   07/11/23 1200  Spiritual Encounters  Type of Visit Initial  Care provided to: Pt and family  Conversation partners present during encounter Nurse  Referral source Patient request;Family  Reason for visit Advance directives  OnCall Visit Yes  Spiritual Framework  Presenting Themes Community and relationships  Patient Stress Factors Not reviewed  Family Stress Factors Health changes  Interventions  Spiritual Care Interventions Made Established relationship of care and support;Compassionate presence  Intervention Outcomes  Outcomes Connection to spiritual care;Connection to values and goals of care  Spiritual Care Plan  Spiritual Care Issues Still Outstanding Chaplain will continue to follow   Receive an call from the Nurses Station patient, friends, and Renato Gails all want paperwork for Advance Directive. Patient is not verbal but understands verbal communication. Nurse was in the room when I ask if she could comprehend what is and was going on. Nurses shake head in agree that she could. Advise friends, patient, and pastor give [place] sometime to get this completed. We have to get everyone inline to make sure this is done correctly.

## 2023-07-11 NOTE — Progress Notes (Signed)
Physical Therapy Treatment Patient Details Name: Marissa Gilmore MRN: [id number] DOB: Nov 13, 1930 Today's Date: 07/11/2023   History of Present Illness Marissa Gilmore is a 89 y.o. female with medical history significant for Chronic atrial fibrillation on Eliquis and amiodarone, systolic CHF, HTN and HLD who presented to the ED initially with a 2-day history of persistent vaginal bleeding including passage of golf ball size clots.   While in the emergency room, patient developed atrial fibrillation with RVR, then subsequently she developed left-sided facial droop and slurred speech.  MRI brain showed subtle 1.5 cm focus of diffusion signal abnormality involving the right frontal corona radiata, suspicious for an acute ischemic infarct. Patient with L side weakness and aphasia.    PT Comments  Pt is slowly progressing towards PT goals. Pt is received in bed with family friend at bedside, she is agreeable to PT session. Pt cont to demonstrate difficulty with expressive aphasia but able to consistently response to yes/no questions throughout session. Pt performs bed mobility and transfer max-total A x2. Able to demonstrate initiative with bed mobility and B LE movement in supine but requires max-total A x2 due to decrease activity tolerance and weakness. In sitting EOB, Pt demonstrates heavy L trunk lean with reports of "feeling at midline". 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 Additionally, Pt required max-total A to maintain seated balance and demonstrated poor initiation to sit upright. Attempted STS x1 with unable to achieve task due to weakness. Overall, Pt demonstrates increased L trunk lean and decreased activity tolerance. Pt would benefit from cont skilled PT to address above deficits and promote optimal return to PLOF.     If plan is discharge home, recommend the following:  Help with stairs or ramp for entrance;Assistance with feeding;Assistance with cooking/housework;Two people to help with walking and/or transfers;Two people to help with bathing/dressing/bathroom   Can travel by private vehicle        Equipment Recommendations  Other (comment) (TBD at this time)    Recommendations for Other Services       Precautions / Restrictions Precautions Precautions: Fall Precaution Comments: Low Hgb from vaginal bleeding, L Hemi Restrictions Weight Bearing Restrictions: No     Mobility  Bed Mobility Overal bed mobility: Needs Assistance Bed Mobility: Supine to Sit, Sit to Supine     Supine to sit: Max assist, +2 for physical assistance, Total assist, HOB elevated Sit to supine: Max assist, Total assist, +2 for physical assistance   General bed mobility comments: Pt required max-total A x2 for bed mobility to get EOB and return to supine    Transfers Overall transfer level: Needs assistance Equipment used: 2 person hand held assist Transfers: Sit to/from Stand Sit to Stand: Total assist, +2 physical assistance           General transfer comment: 1x STS attempt per Pt's request but unable to achieve task due to generalized weakness    Ambulation/Gait               General Gait Details: Not performed at this time due to safety concerns   Stairs             Wheelchair Mobility     Tilt Bed    Modified Rankin (Stroke Patients Only)       Balance Overall balance assessment: Needs assistance Sitting-balance support: Feet supported Sitting balance-Leahy Scale: Poor Sitting balance -  Comments: heavy L trunk lean when seated EOB requiring max-total A for midline correction; occasions of able to correct to midline with verbal and tactile cues but cont to demonstrate pushers syndrome Postural control: Left lateral lean Standing balance support: During functional activity, Bilateral upper extremity supported Standing  balance-Leahy Scale: Poor Standing balance comment: Unable to assess due to increase fatigue and safety concerns                            Cognition Arousal: Lethargic Behavior During Therapy: WFL for tasks assessed/performed Overall Cognitive Status: Difficult to assess                                 General Comments: Pt demonstrates expressive aphasia, although speech production slightly improved        Exercises Other Exercises Other Exercises: seated x3 knee extension with assistance for trunk support    General Comments        Pertinent Vitals/Pain Pain Assessment Pain Assessment: No/denies pain    Home Living                          Prior Function            PT Goals (current goals can now be found in the care plan section) Acute Rehab PT Goals Patient Stated Goal: to return to independence PT Goal Formulation: With patient Time For Goal Achievement: 07/24/23 Potential to Achieve Goals: Fair Progress towards PT goals: Progressing toward goals    Frequency    Min 1X/week      PT Plan      Co-evaluation              AM-PAC PT "6 Clicks" Mobility   Outcome Measure  Help needed turning from your back to your side while in a flat bed without using bedrails?: A Lot Help needed moving from lying on your back to sitting on the side of a flat bed without using bedrails?: A Lot Help needed moving to and from a bed to a chair (including a wheelchair)?: Total Help needed standing up from a chair using your arms (e.g., wheelchair or bedside chair)?: Total Help needed to walk in hospital room?: Total Help needed climbing 3-5 steps with a railing? : Total 6 Click Score: 8    End of Session   Activity Tolerance: Patient limited by fatigue Patient left: in bed;with call bell/phone within reach;with bed alarm set;with family/visitor present;with SCD's reapplied Nurse Communication: Mobility status PT Visit Diagnosis:  Hemiplegia and hemiparesis;Muscle weakness (generalized) (M62.81);Difficulty in walking, not elsewhere classified (R26.2);Other abnormalities of gait and mobility (R26.89) Hemiplegia - Right/Left: Left Hemiplegia - dominant/non-dominant: Non-dominant Hemiplegia - caused by: Cerebral infarction     Time: 4098-1191 PT Time Calculation (min) (ACUTE ONLY): 25 min  Charges:                            Elmon Else, SPT    Laysha Childers 07/11/2023, 2:32 PM

## 2023-07-11 NOTE — NC FL2 (Signed)
[place] MEDICAID FL2 LEVEL OF CARE FORM     IDENTIFICATION  Patient Name: Marissa Gilmore Birthdate: 1931-09-26 Sex: female Admission Date (Current Location): 07/08/2023  [facility] and [place] Number:  Chiropodist and Address:  [facility], [address], [place], [place] [address]      Provider Number: [id number]  Attending Physician Name and Address:  Marrion Coy, MD  Relative Name and Phone Number:       Current Level of Care: Hospital Recommended Level of Care: Skilled Nursing Facility Prior Approval Number:    Date Approved/Denied:   PASRR Number: [id number]  Discharge Plan: SNF    Current Diagnoses: Patient Active Problem List   Diagnosis Date Noted   B12 deficiency anemia 07/11/2023   Malnutrition of moderate degree 07/11/2023   Acute CVA (cerebrovascular accident) (HCC) 07/09/2023   ABLA (acute blood loss anemia) 07/09/2023   Post-menopausal bleeding 07/09/2023   Atrial fibrillation with rapid ventricular response (HCC) 07/09/2023   Hypotension 07/09/2023   AKI (acute kidney injury) (HCC) 07/09/2023   Acute arterial ischemic stroke, multifocal, anterior circulation, right (HCC) 07/09/2023   Atherosclerosis of native arteries of extremity with intermittent claudication (HCC) 04/14/2023   Hypertensive heart disease with heart failure (HCC) 09/11/2020   Peripheral edema 09/11/2020   Chronic systolic (congestive) heart failure (HCC) 03/09/2020   Paroxysmal atrial fibrillation (HCC) 03/09/2020   Chronic anticoagulation 03/09/2020   Bilateral lower extremity edema 11/23/2019   Dyspnea on exertion 11/23/2019   Mixed hyperlipidemia 09/02/2019   Prediabetes 09/02/2019   Osteoarthritis 11/14/2017   Allergic rhinitis 10/03/2017   Hypertension 08/22/2017   Fatigue 08/22/2017    Orientation RESPIRATION BLADDER Height & Weight     Self, Place  Normal Incontinent Weight: 128 lb (58.1 kg) Height:  5\' 4"  (162.6 cm)   BEHAVIORAL SYMPTOMS/MOOD NEUROLOGICAL BOWEL NUTRITION STATUS   (None)  (Acute CVA) Continent Diet (DYS 1. Fluid nectar thick.)  AMBULATORY STATUS COMMUNICATION OF NEEDS Skin   Extensive Assist Verbally Bruising                       Personal Care Assistance Level of Assistance  Bathing, Feeding, Dressing Bathing Assistance: Maximum assistance Feeding assistance: Limited assistance Dressing Assistance: Maximum assistance     Functional Limitations Info  Sight, Hearing, Speech Sight Info: Adequate Hearing Info: Adequate Speech Info: Impaired (Slurred/dysarthria)    SPECIAL CARE FACTORS FREQUENCY  PT (By licensed PT), OT (By licensed OT)     PT Frequency: 5 x week OT Frequency: 5 x week            Contractures Contractures Info: Not present    Additional Factors Info  Code Status, Allergies Code Status Info: DNR Allergies Info: NKDA           Current Medications (07/11/2023):  This is the current hospital active medication list Current Facility-Administered Medications  Medication Dose Route Frequency Provider Last Rate Last Admin   0.9 %  sodium chloride infusion   Intravenous Continuous Marrion Coy, MD 50 mL/hr at 07/11/23 0016 New Bag at 07/11/23 0016   acetaminophen (TYLENOL) tablet 650 mg  650 mg Oral Q4H PRN Andris Baumann, MD       Or   acetaminophen (TYLENOL) 160 MG/5ML solution 650 mg  650 mg Per Tube Q4H PRN Andris Baumann, MD       Or   acetaminophen (TYLENOL) suppository 650 mg  650 mg Rectal Q4H PRN Andris Baumann, MD  aspirin EC tablet 81 mg  81 mg Oral Daily Lindajo Royal V, MD   81 mg at 07/11/23 0929   atorvastatin (LIPITOR) tablet 40 mg  40 mg Oral Daily Andris Baumann, MD   40 mg at 07/11/23 2595   cyanocobalamin (VITAMIN B12) tablet 2,000 mcg  2,000 mcg Oral Daily Caryl Pina, MD   2,000 mcg at 07/11/23 6387   diltiazem (CARDIZEM) tablet 30 mg  30 mg Oral Q8H Marrion Coy, MD   30 mg at 07/11/23 1413   feeding supplement (NEPRO  CARB STEADY) liquid 237 mL  237 mL Oral TID BM Marrion Coy, MD       medroxyPROGESTERone (PROVERA) tablet 20 mg  20 mg Oral TID Marrion Coy, MD   20 mg at 07/11/23 0929   metoprolol tartrate (LOPRESSOR) injection 2.5 mg  2.5 mg Intravenous Q6H PRN Marrion Coy, MD   2.5 mg at 07/10/23 0153   metoprolol tartrate (LOPRESSOR) tablet 25 mg  25 mg Oral BID Marrion Coy, MD   25 mg at 07/11/23 5643   multivitamin with minerals tablet 1 tablet  1 tablet Oral Daily Marrion Coy, MD   1 tablet at 07/11/23 3295   sodium bicarbonate tablet 650 mg  650 mg Oral BID Marrion Coy, MD   650 mg at 07/11/23 1884     Discharge Medications: Please see discharge summary for a list of discharge medications.  Relevant Imaging Results:  Relevant Lab Results:   Additional Information SS#: [id number]  Margarito Liner, LCSW

## 2023-07-11 NOTE — Progress Notes (Signed)
   07/11/23 1400  Spiritual Encounters  Type of Visit Follow up  Care provided to: Pt and family  Referral source Patient request;Family  Reason for visit Advance directives  OnCall Visit No  Spiritual Framework  Presenting Themes Impactful experiences and emotions  Patient Stress Factors Not reviewed  Family Stress Factors Health changes  Interventions  Spiritual Care Interventions Made Established relationship of care and support;Decision-making support/facilitation  Intervention Outcomes  Outcomes Awareness of health;Autonomy/agency  Spiritual Care Plan  Spiritual Care Issues Still Outstanding No further spiritual care needs at this time (see row info)   Chaplain went to help patient with an advance directive. Chaplain got a Conservation officer, nature and witnesses to sign the paperwork for the patient. Paperwork was scanned in.

## 2023-07-11 NOTE — Progress Notes (Signed)
Progress Note   Patient: Marissa Gilmore UYQ:[id number] DOB: [birth year] DOA: 07/08/2023     2 DOS: the patient was seen and examined on 07/11/2023   Brief hospital course: Marissa Gilmore is a 89 y.o. female with medical history significant for Chronic atrial fibrillation on Eliquis and amiodarone, systolic CHF, HTN and HLD who presented to the ED initially with a 2-day history of persistent vaginal bleeding including passage of golf ball size clots.  While in the emergency room, patient developed atrial fibrillation with RVR, then subsequently she developed left-sided facial droop and slurred speech. MRI brain showed subtle 1.5 cm focus of diffusion signal abnormality involving the right frontal corona radiata, suspicious for an acute ischemic infarct. MRA head: 1. Acute occlusion of a proximal right M2 branch, superior division. 2. Additional moderate proximal right M2 stenosis, inferior division. 3. Severe distal right P2 stenosis, with additional moderate left P2 stenosis. 4. Focal severe right A1 stenosis. Patient is seen by neurology, due to large amount of vaginal bleeding, anticoagulation was not able to start. Patient continued to have vaginal bleeding while in the hospital, not able to start anticoagulation.   Principal Problem:   Acute CVA (cerebrovascular accident) (HCC) Active Problems:   ABLA (acute blood loss anemia)   Hypotension   AKI (acute kidney injury) (HCC)   Chronic systolic (congestive) heart failure (HCC)   Chronic anticoagulation   Post-menopausal bleeding   Atrial fibrillation with rapid ventricular response (HCC)   Acute arterial ischemic stroke, multifocal, anterior circulation, right (HCC)   B12 deficiency anemia   Malnutrition of moderate degree   Assessment and Plan: * Acute CVA (cerebrovascular accident) (HCC) Dysphagia secondary to CVA. Patient had a single stroke in the right ACA distribution.  Given multiple intracranial arterial  obstructions, this most likely ischemic stroke.  However, due to large vaginal bleeding, not able to start antiplatelet antiplatelets or Eliquis. Continue statin. Patient has worsening aphasia and left-sided weakness. Patient also being seen by speech therapy, she has a severe dysphagia.  Diet modified. Patient also had very poor appetite, very little p.o. intake.  Discussed with the patient and her friend at the bedside, patient could not make a decision about tube feeding through NG tube.  Patient niece is coming from [place], will continue discussion. Due to multiple medical problems, patient long-term prognosis is poor.  Patient has been evaluated by palliative care, will continue goals of care discussion.   ABLA (acute blood loss anemia) Postmenopausal bleeding B12 deficient anemia. Patient so far has received 3 units of PRBC, she will also give a dose of IV iron given large amount of bleeding.  She also has B12 deficiency, started on B12 supplement. Initially placed on progesterone 200 mg daily as recommended by GYN after discussion with OB/GYN.  But bleeding does not seem to be improving. Have reached out to OB/GYN again 9/5, per Dr. Jean Gilmore, there is nothing he can do other than switching current progesterone to Provera at 20 mg 3 times a day.  Patient probably has a cancer.   She is receiving the third unit of PRBC today for hemoglobin of 6.5.  AKI (acute kidney injury) on chronic kidney disease stage IIIa.   Mild metabolic acidosis. Renal function has been better after fluids.   Atrial fibrillation with rapid ventricular response (HCC) Hypotension. Not able to start anticoagulation due to large vaginal bleeding. Blood pressure seem to be better after initial fluids.   Patient also received a 0.5 mg of digoxin on  9/4 diltiazem 30 mg every 8 hours for RVR. Heart rate much better controlled.   Chronic systolic (congestive) heart failure (HCC) EF 30% 2021 Clinically  euvolemic Holding torsemide, spironolactone, Entresto for relative low blood pressure.  Metoprolol restarted for HR control.   Nonsustained VT. Patient had 5 beats of VT, will monitor closely, correct electrolytes abnormalities.       Subjective:  Patient still had vaginal bleeding yesterday.  She has a worsening aphasia and dysphagia, speech therapy had downgraded patient diet. She does not seem to have any short of breath.  She has no confusion.  Physical Exam: Vitals:   07/11/23 0009 07/11/23 0559 07/11/23 0730 07/11/23 1159  BP: (!) 106/56 (!) 124/49 (!) 131/45 (!) 114/43  Pulse: 86 81 90 76  Resp: 18 18 17 18   Temp: 98 F (36.7 C) 98.2 F (36.8 C) 97.6 F (36.4 C) 97.8 F (36.6 C)  TempSrc:   Oral Oral  SpO2: 98% 97% 99% 98%  Weight:      Height:       General exam: Appears calm and comfortable, moderately malnourished. Respiratory system: Clear to auscultation. Respiratory effort normal. Cardiovascular system: Irregular no JVD, murmurs, rubs, gallops or clicks. No pedal edema. Gastrointestinal system: Abdomen is nondistended, soft and nontender. No organomegaly or masses felt. Normal bowel sounds heard. Central nervous system: Alert and oriented.  Aphasia and left hemiparesis Extremities: Symmetric 5 x 5 power. Skin: No rashes, lesions or ulcers Psychiatry:  Mood & affect appropriate.    Data Reviewed:  Lab results reviewed.  Family Communication: Friend at the bedside.  Disposition: Status is: Inpatient Remains inpatient appropriate because: Severity of disease, IV treatment.     Time spent: 55 minutes  Author: Marrion Coy, MD 07/11/2023 12:12 PM  For on call review [email or web address].

## 2023-07-11 NOTE — Progress Notes (Signed)
Initial Nutrition Assessment  DOCUMENTATION CODES:   Non-severe (moderate) malnutrition in context of chronic illness  INTERVENTION:   -Nepro Shake po TID, each supplement provides 425 kcal and 19 grams protein  -Magic cup TID with meals, each supplement provides 290 kcal and 9 grams of protein  -MVI with minerals daily -Feeding assistance with meals -RD will follow for goals of care and make further recommendations as applicable  NUTRITION DIAGNOSIS:   Moderate Malnutrition related to chronic illness (CHF) as evidenced by mild fat depletion, moderate fat depletion, mild muscle depletion, moderate muscle depletion.  GOAL:   Patient will meet greater than or equal to 90% of their needs  MONITOR:   PO intake, Supplement acceptance  REASON FOR ASSESSMENT:   Consult Assessment of nutrition requirement/status, Poor PO  ASSESSMENT:   Pt with medical history significant for Chronic atrial fibrillation on Eliquis and amiodarone, systolic CHF, HTN and HLD who presented with a 2-day PTA history of persistent vaginal bleeding including passage of golf ball size clots.  Pt admitted with acute CVA and ABLA.   9/4- s/p BSE- dysphagia 2 diet with thin liquids 9/5- s/p BSE- downgraded to dysphagia 1 diet with nectar thick liquids  Reviewed I/O's: +221 ml x 24 hours and +721 ml x 24 hours   Per neurology notes, pt is not a good candidate for thrombectomy secondary to frailty.   Palliative care consulted for goals of care discussions; noted concerns of pt being able to meet her nutritional needs due to frailty and restrictions of diet. Plan to follow-up tomorrow when family members arrive.   Case discussed with SLP, who repots concern over pt's ability to feed herself and receive adequate nutrition.   Pt resting in bed at time of visit. Pt easily arose to voice. Pt with garbled speech, but able to make her needs known by responding to close ended questions. She complained of a dry mouth  and difficulty chewing and swallowing related to lack of teeth. She is unsure if the puree diet will help with this. Per pt, she had a good appetite PTA- she did not cook, but frequently ate out- her favorite meal is meat loaf from [employer].   Pt denies any weight loss. Reviewed wt hx; pt has experienced a 1.7% wt loss over the past 3 months, which is not significant for time frame.   Discussed importance of good meal and supplement intake to promote healing. Pt is amenable. RD also offered feeding assistance with meals due to limited mobility of extremities. Agree that pt will likely be unable to meet nutritional needs via PO route and continued goals of care discussions.   Medications reviewed and include vitamin B-12, cardizem, and 0.9% sodium chloride infusion @ 50 ml/hr  Labs reviewed.    NUTRITION - FOCUSED PHYSICAL EXAM:  Flowsheet Row Most Recent Value  Orbital Region Moderate depletion  Upper Arm Region Mild depletion  Thoracic and Lumbar Region No depletion  Buccal Region Mild depletion  Temple Region Mild depletion  Clavicle Bone Region Moderate depletion  Clavicle and Acromion Bone Region Moderate depletion  Scapular Bone Region Moderate depletion  Dorsal Hand Mild depletion  Patellar Region Mild depletion  Anterior Thigh Region Mild depletion  Posterior Calf Region Mild depletion  Edema (RD Assessment) None  Hair Reviewed  Eyes Reviewed  Mouth Reviewed  Skin Reviewed  Nails Reviewed       Diet Order:   Diet Order  DIET - DYS 1 Room service appropriate? Yes; Fluid consistency: Nectar Thick  Diet effective now                   EDUCATION NEEDS:   Education needs have been addressed  Skin:  Skin Assessment: Reviewed RN Assessment  Last BM:  Unknown  Height:   Ht Readings from Last 1 Encounters:  07/08/23 5\' 4"  (1.626 m)    Weight:   Wt Readings from Last 1 Encounters:  07/08/23 58.1 kg    Ideal Body Weight:  54.5  kg  BMI:  Body mass index is 21.97 kg/m.  Estimated Nutritional Needs:   Kcal:  1650-1850  Protein:  85-100 grams  Fluid:  > 1.6 L    Levada Schilling, RD, LDN, CDCES Registered Dietitian II Certified Diabetes Care and Education Specialist Please refer to [facility] for RD and/or RD on-call/weekend/after hours pager

## 2023-07-11 NOTE — TOC Progression Note (Signed)
Transition of Care [facility]) - Progression Note    Patient Details  Name: Marissa Gilmore MRN: [id number] Date of Birth: 1931/03/05  [facility] of [facility]) CM/SW Contact  Margarito Liner, LCSW Phone Number: 07/11/2023, 2:56 PM  Clinical Narrative:  CSW met with patient. Wanda Plump, niece, and grandnephew. CSW introduced role and explained that therapy recommendations would be discussed. CSW provided CMS scores for SNF's within 25 miles of her zip code. Armando Reichert is hopeful that she will regain her strength once she receives more nutrition. CSW sent out SNF referral to see what her options are. No further concerns. CSW encouraged patient to contact CSW as needed. CSW will continue to follow patient for support and facilitate discharge once medically stable.  Expected Discharge Plan: Skilled Nursing Facility    Expected Discharge Plan and Services       Living arrangements for the past 2 months: Single Family Home                                       Social Determinants of Health (SDOH) Interventions SDOH Screenings   Food Insecurity: No Food Insecurity (07/09/2023)  Housing: Low Risk  (07/09/2023)  Transportation Needs: No Transportation Needs (07/09/2023)  Utilities: Not At Risk (07/09/2023)  Alcohol Screen: Low Risk  (09/16/2022)  Depression (PHQ2-9): Low Risk  (09/16/2022)  Financial Resource Strain: Low Risk  (09/16/2022)  Physical Activity: Insufficiently Active (09/16/2022)  Social Connections: Moderately Isolated (09/16/2022)  Stress: No Stress Concern Present (09/16/2022)  Tobacco Use: Low Risk  (07/09/2023)    Readmission Risk Interventions     No data to display

## 2023-07-11 NOTE — Progress Notes (Signed)
SLP Cancellation Note  Patient Details Name: Marissa Gilmore MRN: [id number] DOB: September 20, 1931   Cancelled treatment:       Reason Eval/Treat Not Completed: Patient at procedure or test/unavailable   PT in working with pt. Will re-attempt at next available opportunity.   Mayra Brahm B. Dreama Saa, M.S., CCC-SLP, CBIS Speech-Language Pathologist Certified Brain Injury Specialist [facility]  [facility] [phone] Ascom [phone] Fax [phone]   Reuel Derby 07/11/2023, 1:56 PM

## 2023-07-12 ENCOUNTER — Inpatient Hospital Stay: Payer: Medicare Other | Admitting: Anesthesiology

## 2023-07-12 ENCOUNTER — Other Ambulatory Visit: Payer: Self-pay

## 2023-07-12 ENCOUNTER — Encounter
Admission: EM | Disposition: A | Discharge status: Hospice | Payer: Self-pay | Source: Home / Self Care | Attending: Internal Medicine

## 2023-07-12 DIAGNOSIS — I639 Cerebral infarction, unspecified: Secondary | ICD-10-CM | POA: Diagnosis not present

## 2023-07-12 DIAGNOSIS — N950 Postmenopausal bleeding: Secondary | ICD-10-CM | POA: Diagnosis not present

## 2023-07-12 DIAGNOSIS — E870 Hyperosmolality and hypernatremia: Secondary | ICD-10-CM | POA: Insufficient documentation

## 2023-07-12 DIAGNOSIS — D62 Acute posthemorrhagic anemia: Secondary | ICD-10-CM | POA: Diagnosis not present

## 2023-07-12 DIAGNOSIS — I4891 Unspecified atrial fibrillation: Secondary | ICD-10-CM | POA: Diagnosis not present

## 2023-07-12 HISTORY — PX: DILATION AND CURETTAGE OF UTERUS: SHX78

## 2023-07-12 LAB — TYPE AND SCREEN
ABO/RH(D): AB POS
Antibody Screen: NEGATIVE
Unit division: 0
Unit division: 0
Unit division: 0

## 2023-07-12 LAB — BPAM RBC
Blood Product Expiration Date: 202409112359
Blood Product Expiration Date: 202409142359
Blood Product Expiration Date: 202409292359
ISSUE DATE / TIME: 202409032219
ISSUE DATE / TIME: 202409051104
ISSUE DATE / TIME: 202409061155
Unit Type and Rh: 5100
Unit Type and Rh: 600
Unit Type and Rh: 6200

## 2023-07-12 LAB — CBC
HCT: 24.3 % — ABNORMAL LOW (ref 36.0–46.0)
Hemoglobin: 8.4 g/dL — ABNORMAL LOW (ref 12.0–15.0)
MCH: 31.6 pg (ref 26.0–34.0)
MCHC: 34.6 g/dL (ref 30.0–36.0)
MCV: 91.4 fL (ref 80.0–100.0)
Platelets: 142 10*3/uL — ABNORMAL LOW (ref 150–400)
RBC: 2.66 MIL/uL — ABNORMAL LOW (ref 3.87–5.11)
RDW: 16.9 % — ABNORMAL HIGH (ref 11.5–15.5)
WBC: 11.9 10*3/uL — ABNORMAL HIGH (ref 4.0–10.5)
nRBC: 1.5 % — ABNORMAL HIGH (ref 0.0–0.2)

## 2023-07-12 LAB — PHOSPHORUS: Phosphorus: 3.8 mg/dL (ref 2.5–4.6)

## 2023-07-12 LAB — MAGNESIUM: Magnesium: 2.7 mg/dL — ABNORMAL HIGH (ref 1.7–2.4)

## 2023-07-12 LAB — BASIC METABOLIC PANEL
Anion gap: 8 (ref 5–15)
BUN: 30 mg/dL — ABNORMAL HIGH (ref 8–23)
CO2: 21 mmol/L — ABNORMAL LOW (ref 22–32)
Calcium: 9.5 mg/dL (ref 8.9–10.3)
Chloride: 117 mmol/L — ABNORMAL HIGH (ref 98–111)
Creatinine, Ser: 1.57 mg/dL — ABNORMAL HIGH (ref 0.44–1.00)
GFR, Estimated: 31 mL/min — ABNORMAL LOW (ref >60)
Glucose, Bld: 131 mg/dL — ABNORMAL HIGH (ref 70–99)
Potassium: 4.6 mmol/L (ref 3.5–5.1)
Sodium: 146 mmol/L — ABNORMAL HIGH (ref 135–145)

## 2023-07-12 LAB — HEMOGLOBIN: Hemoglobin: 8.4 g/dL — ABNORMAL LOW (ref 12.0–15.0)

## 2023-07-12 SURGERY — DILATION AND CURETTAGE
Anesthesia: General

## 2023-07-12 MED ORDER — MEDROXYPROGESTERONE ACETATE 10 MG PO TABS
20.0000 mg | ORAL_TABLET | Freq: Every day | ORAL | Status: DC
  Administered 2023-07-13 – 2023-07-16 (×4): 20 mg via ORAL
  Filled 2023-07-12 (×6): qty 2

## 2023-07-12 MED ORDER — CEFAZOLIN SODIUM-DEXTROSE 2-3 GM-%(50ML) IV SOLR
INTRAVENOUS | Status: DC | PRN
Start: 2023-07-12 — End: 2023-07-12
  Administered 2023-07-12: 2 g via INTRAVENOUS

## 2023-07-12 MED ORDER — DEXAMETHASONE SODIUM PHOSPHATE 10 MG/ML IJ SOLN
INTRAMUSCULAR | Status: AC
  Filled 2023-07-12: qty 1

## 2023-07-12 MED ORDER — DEXTROSE 5 % IV SOLN: INTRAVENOUS | Status: AC

## 2023-07-12 MED ORDER — PROPOFOL 10 MG/ML IV BOLUS
INTRAVENOUS | Status: DC | PRN
  Administered 2023-07-12: 50 mg via INTRAVENOUS

## 2023-07-12 MED ORDER — ONDANSETRON HCL 4 MG/2ML IJ SOLN
INTRAMUSCULAR | Status: AC
  Filled 2023-07-12: qty 2

## 2023-07-12 MED ORDER — VASOPRESSIN 20 UNIT/ML IV SOLN
INTRAVENOUS | Status: DC | PRN
Start: 2023-07-12 — End: 2023-07-12
  Administered 2023-07-12: 1 [IU] via INTRAVENOUS

## 2023-07-12 MED ORDER — OXYCODONE HCL 5 MG PO TABS: 5.0000 mg | ORAL_TABLET | Freq: Once | ORAL | Status: DC | PRN

## 2023-07-12 MED ORDER — PHENYLEPHRINE HCL-NACL 20-0.9 MG/250ML-% IV SOLN
INTRAVENOUS | Status: DC | PRN
Start: 2023-07-12 — End: 2023-07-12
  Administered 2023-07-12: 70 ug/min via INTRAVENOUS

## 2023-07-12 MED ORDER — DEXAMETHASONE SODIUM PHOSPHATE 10 MG/ML IJ SOLN
INTRAMUSCULAR | Status: DC | PRN
  Administered 2023-07-12: 10 mg via INTRAVENOUS

## 2023-07-12 MED ORDER — FENTANYL CITRATE (PF) 100 MCG/2ML IJ SOLN: 25.0000 ug | INTRAMUSCULAR | Status: DC | PRN

## 2023-07-12 MED ORDER — PROPOFOL 10 MG/ML IV BOLUS
INTRAVENOUS | Status: AC
  Filled 2023-07-12: qty 20

## 2023-07-12 MED ORDER — SUCCINYLCHOLINE CHLORIDE 200 MG/10ML IV SOSY
PREFILLED_SYRINGE | INTRAVENOUS | Status: DC | PRN
  Administered 2023-07-12: 70 mg via INTRAVENOUS

## 2023-07-12 MED ORDER — SODIUM CHLORIDE 0.9 % IV SOLN
300.0000 mg | Freq: Once | INTRAVENOUS | Status: AC
  Administered 2023-07-12: 300 mg via INTRAVENOUS
  Filled 2023-07-12: qty 300

## 2023-07-12 MED ORDER — PHENYLEPHRINE HCL-NACL 20-0.9 MG/250ML-% IV SOLN
INTRAVENOUS | Status: AC
  Filled 2023-07-12: qty 250

## 2023-07-12 MED ORDER — ONDANSETRON HCL 4 MG/2ML IJ SOLN: 4.0000 mg | Freq: Once | INTRAMUSCULAR | Status: DC | PRN

## 2023-07-12 MED ORDER — FENTANYL CITRATE (PF) 100 MCG/2ML IJ SOLN
INTRAMUSCULAR | Status: AC
  Filled 2023-07-12: qty 2

## 2023-07-12 MED ORDER — OXYCODONE HCL 5 MG/5ML PO SOLN: 5.0000 mg | Freq: Once | ORAL | Status: DC | PRN

## 2023-07-12 MED ORDER — ONDANSETRON HCL 4 MG/2ML IJ SOLN
INTRAMUSCULAR | Status: DC | PRN
  Administered 2023-07-12: 4 mg via INTRAVENOUS

## 2023-07-12 MED ORDER — VASOPRESSIN 20 UNIT/ML IV SOLN
INTRAVENOUS | Status: AC
  Filled 2023-07-12: qty 1

## 2023-07-12 MED ORDER — ACETAMINOPHEN 10 MG/ML IV SOLN: 1000.0000 mg | Freq: Once | INTRAVENOUS | Status: DC | PRN

## 2023-07-12 MED ORDER — SUCCINYLCHOLINE CHLORIDE 200 MG/10ML IV SOSY
PREFILLED_SYRINGE | INTRAVENOUS | Status: AC
  Filled 2023-07-12: qty 10

## 2023-07-12 MED ORDER — LACTATED RINGERS IV SOLN
INTRAVENOUS | Status: DC | PRN
Start: 2023-07-12 — End: 2023-07-12

## 2023-07-12 MED ORDER — PHENYLEPHRINE 80 MCG/ML (10ML) SYRINGE FOR IV PUSH (FOR BLOOD PRESSURE SUPPORT)
PREFILLED_SYRINGE | INTRAVENOUS | Status: DC | PRN
Start: 2023-07-12 — End: 2023-07-12
  Administered 2023-07-12 (×2): 160 ug via INTRAVENOUS

## 2023-07-12 MED ORDER — LIDOCAINE HCL (CARDIAC) PF 100 MG/5ML IV SOSY
PREFILLED_SYRINGE | INTRAVENOUS | Status: DC | PRN
  Administered 2023-07-12: 60 mg via INTRAVENOUS

## 2023-07-12 MED ORDER — FENTANYL CITRATE (PF) 100 MCG/2ML IJ SOLN
INTRAMUSCULAR | Status: DC | PRN
  Administered 2023-07-12: 50 ug via INTRAVENOUS
  Administered 2023-07-12 (×2): 25 ug via INTRAVENOUS

## 2023-07-12 MED ORDER — ESMOLOL HCL 100 MG/10ML IV SOLN
INTRAVENOUS | Status: DC | PRN
Start: 2023-07-12 — End: 2023-07-12
  Administered 2023-07-12: 20 mg via INTRAVENOUS
  Administered 2023-07-12: 30 mg via INTRAVENOUS

## 2023-07-12 SURGICAL SUPPLY — 26 items
BAG PRESSURE INF REUSE 1000 (BAG) ×1 IMPLANT
DEVICE MYOSURE LITE (MISCELLANEOUS) IMPLANT
DEVICE MYOSURE REACH (MISCELLANEOUS) IMPLANT
DRSG TELFA 3X8 NADH STRL (GAUZE/BANDAGES/DRESSINGS) ×1 IMPLANT
GLOVE SURG SYN 8.0 (GLOVE) ×1 IMPLANT
GLOVE SURG SYN 8.0 PF PI (GLOVE) ×1 IMPLANT
GOWN STRL REUS W/ TWL LRG LVL3 (GOWN DISPOSABLE) ×1 IMPLANT
GOWN STRL REUS W/ TWL XL LVL3 (GOWN DISPOSABLE) ×1 IMPLANT
GOWN STRL REUS W/TWL LRG LVL3 (GOWN DISPOSABLE) ×1
GOWN STRL REUS W/TWL XL LVL3 (GOWN DISPOSABLE) ×1
IV NS 1000ML (IV SOLUTION) ×1
IV NS 1000ML BAXH (IV SOLUTION) ×1 IMPLANT
IV NS IRRIG 3000ML ARTHROMATIC (IV SOLUTION) ×1 IMPLANT
KIT PROCEDURE FLUENT (KITS) IMPLANT
KIT TURNOVER CYSTO (KITS) ×1 IMPLANT
MANIFOLD NEPTUNE II (INSTRUMENTS) ×1 IMPLANT
PACK DNC HYST (MISCELLANEOUS) IMPLANT
PAD OB MATERNITY 4.3X12.25 (PERSONAL CARE ITEMS) ×1 IMPLANT
PAD PREP OB/GYN DISP 24X41 (PERSONAL CARE ITEMS) ×1 IMPLANT
SCRUB CHG 4% DYNA-HEX 4OZ (MISCELLANEOUS) ×1 IMPLANT
SEAL ROD LENS SCOPE MYOSURE (ABLATOR) ×1 IMPLANT
SET CYSTO W/LG BORE CLAMP LF (SET/KITS/TRAYS/PACK) IMPLANT
TOWEL OR 17X26 4PK STRL BLUE (TOWEL DISPOSABLE) ×1 IMPLANT
TRAP FLUID SMOKE EVACUATOR (MISCELLANEOUS) ×1 IMPLANT
TUBING CONNECTING 10 (TUBING) IMPLANT
WATER STERILE IRR 500ML POUR (IV SOLUTION) ×1 IMPLANT

## 2023-07-12 NOTE — Anesthesia Preprocedure Evaluation (Signed)
Anesthesia Evaluation  Patient identified by MRN, date of birth, ID band Patient awake    Reviewed: Allergy & Precautions, NPO status , Patient's Chart, lab work & pertinent test results  History of Anesthesia Complications Negative for: history of anesthetic complications  Airway Mallampati: III  TM Distance: >3 FB Neck ROM: Full   Comment: Dark red residue in mouth. Patient denies vomiting or hematemesis Dental  (+) Poor Dentition, Chipped   Pulmonary neg pulmonary ROS, neg sleep apnea, neg COPD, Patient abstained from smoking.Not current smoker   Pulmonary exam normal breath sounds clear to auscultation       Cardiovascular Exercise Tolerance: Poor METShypertension, Pt. on medications pulmonary hypertension+ Peripheral Vascular Disease and +CHF  (-) CAD and (-) Past MI + dysrhythmias Atrial Fibrillation  Rhythm:Irregular Rate:Normal - Systolic murmurs TTE 07/2023: 1. Left ventricular ejection fraction, by estimation, is 55 to 60%. The  left ventricle has normal function. The left ventricle has no regional  wall motion abnormalities. Left ventricular diastolic function could not  be evaluated.   2. Right ventricular systolic function is normal. The right ventricular  size is normal. There is moderately elevated pulmonary artery systolic  pressure.   3. Left atrial size was mildly dilated.   4. The mitral valve is degenerative. Mild to moderate mitral valve  regurgitation.   5. Tricuspid valve regurgitation is moderate.   6. The aortic valve is normal in structure. Aortic valve regurgitation is  trivial.     Neuro/Psych Stroke in the past few days. Neurology unable to start anticoagulation until her heavy vaginal bleeding is addressed via today's procedure. Residual dysphagia, difficulty speaking, left side hemiparesis CVA, Residual Symptoms  negative psych ROS   GI/Hepatic ,neg GERD  ,,(+)     (-) substance abuse     Endo/Other  neg diabetes    Renal/GU ARFRenal disease     Musculoskeletal   Abdominal   Peds  Hematology  (+) Blood dyscrasia, anemia   Anesthesia Other Findings Past Medical History: No date: Atrial fibrillation (HCC) No date: Cataract No date: Diastolic heart failure (HCC)     Comment:  35% EF No date: Hypertension  Reproductive/Obstetrics                              Anesthesia Physical Anesthesia Plan  ASA: 4  Anesthesia Plan: General   Post-op Pain Management: Ofirmev IV (intra-op)*   Induction: Intravenous and Rapid sequence  PONV Risk Score and Plan: 4 or greater and Ondansetron, Dexamethasone and Treatment may vary due to age or medical condition  Airway Management Planned: Oral ETT and Video Laryngoscope Planned  Additional Equipment: None  Intra-op Plan:   Post-operative Plan: Extubation in OR and Possible Post-op intubation/ventilation  Informed Consent: I have reviewed the patients History and Physical, chart, labs and discussed the procedure including the risks, benefits and alternatives for the proposed anesthesia with the patient or authorized representative who has indicated his/her understanding and acceptance.   Patient has DNR.  Discussed DNR with patient and Continue DNR.   Dental advisory given  Plan Discussed with: CRNA and Surgeon  Anesthesia Plan Comments: (Patient is in a difficult situation. Ideally would not be doing anesthesia on patient with acute stroke a few days ago, but the neurology team is unable to address anticoagulation until her heavy vaginal bleeding has been addressed.  Discussed risks of anesthesia with patient and her niece and nephew at bedside, including PONV,  post operative cognitive dysfunction, sore throat, lip/dental/eye damage. Rare risks discussed as well, such as cardiorespiratory and neurological sequelae, and allergic reactions. Discussed the role of CRNA in patient's perioperative  care. Patient and family understands. Patient counseled on being higher risk for anesthesia due to comorbidities: very recent stroke, advanced age. Patient was told about increased risk of cardiac and respiratory events, including death. Discussed DNR. Patient wishes to continue her DNR, does NOT want chest compressions or defibrillation intra-op. She understands that she will be intubated for procedure and that it's possible the tube will have to stay in longer after procedure in the event of complications or hemodynamic instability. )         Anesthesia Quick Evaluation

## 2023-07-12 NOTE — Plan of Care (Signed)
  Problem: Education: Goal: Knowledge of disease or condition will improve Outcome: Progressing Goal: Knowledge of secondary prevention will improve (MUST DOCUMENT ALL) Outcome: Progressing Goal: Knowledge of patient specific risk factors will improve (Mark N/A or DELETE if not current risk factor) Outcome: Progressing   Problem: Ischemic Stroke/TIA Tissue Perfusion: Goal: Complications of ischemic stroke/TIA will be minimized Outcome: Progressing   Problem: Coping: Goal: Will verbalize positive feelings about self Outcome: Progressing Goal: Will identify appropriate support needs Outcome: Progressing   Problem: Health Behavior/Discharge Planning: Goal: Ability to manage health-related needs will improve Outcome: Progressing Goal: Goals will be collaboratively established with patient/family Outcome: Progressing   Problem: Self-Care: Goal: Ability to participate in self-care as condition permits will improve Outcome: Progressing Goal: Verbalization of feelings and concerns over difficulty with self-care will improve Outcome: Progressing Goal: Ability to communicate needs accurately will improve Outcome: Progressing   Problem: Nutrition: Goal: Risk of aspiration will decrease Outcome: Progressing Goal: Dietary intake will improve Outcome: Progressing   Problem: Education: Goal: Knowledge of General Education information will improve Description: Including pain rating scale, medication(s)/side effects and non-pharmacologic comfort measures Outcome: Progressing   

## 2023-07-12 NOTE — Transfer of Care (Signed)
Immediate Anesthesia Transfer of Care Note  Patient: Marissa Gilmore  Procedure(s) Performed: DILATATION AND CURETTAGE  Patient Location: PACU  Anesthesia Type:General  Level of Consciousness: drowsy and responds to stimulation  Airway & Oxygen Therapy: Patient Spontanous Breathing  Post-op Assessment: Report given to RN and Post -op Vital signs reviewed and stable  Post vital signs: Reviewed and stable  Last Vitals:  Vitals Value Taken Time  BP 131/56 07/12/23 1951  Temp 97.83f   Pulse 115 07/12/23 1953  Resp 20 07/12/23 1953  SpO2 93 % 07/12/23 1953  Vitals shown include unfiled device data.  Last Pain:  Vitals:   07/12/23 1014  TempSrc:   PainSc: 0-No pain         Complications: No notable events documented.

## 2023-07-12 NOTE — Anesthesia Procedure Notes (Signed)
Procedure Name: Intubation Date/Time: 07/12/2023 7:08 PM  Performed by: Karoline Caldwell, CRNAPre-anesthesia Checklist: Patient identified, Patient being monitored, Timeout performed, Emergency Drugs available and Suction available Patient Re-evaluated:Patient Re-evaluated prior to induction Oxygen Delivery Method: Circle system utilized Preoxygenation: Pre-oxygenation with 100% oxygen Induction Type: IV induction Ventilation: Mask ventilation without difficulty Laryngoscope Size: 3 and McGraph Grade View: Grade I Tube type: Oral Tube size: 6.5 mm Number of attempts: 1 Airway Equipment and Method: Stylet Placement Confirmation: ETT inserted through vocal cords under direct vision, positive ETCO2 and breath sounds checked- equal and bilateral Secured at: 19 cm Tube secured with: Tape Dental Injury: Teeth and Oropharynx as per pre-operative assessment

## 2023-07-12 NOTE — Brief Op Note (Signed)
07/12/2023  7:43 PM  PATIENT:  Marissa Gilmore  89 y.o. female  PRE-OPERATIVE DIAGNOSIS: postmenopausal bleeding   POST-OPERATIVE DIAGNOSIS:  postmenopausal bleeding  Urinary retention PROCEDURE:  Procedure(s): DILATATION AND CURETTAGE (N/A)  SURGEON:  Surgeons and Role:    * Byard Carranza, Ihor Austin, MD - Primary  PHYSICIAN ASSISTANT:   ASSISTANTS: none   ANESTHESIA:   general  EBL:  5 mL iof 200 UO 1000cc  BLOOD ADMINISTERED:none  DRAINS: none   LOCAL MEDICATIONS USED:  NONE  SPECIMEN:  Source of Specimen:  endometrial currettings  DISPOSITION OF SPECIMEN:  PATHOLOGY  COUNTS:  YES  TOURNIQUET:  * No tourniquets in log *  DICTATION: .Other Dictation: Dictation Number verbal  PLAN OF CARE: Admit to inpatient   PATIENT DISPOSITION:  PACU - hemodynamically stable.   Delay start of Pharmacological VTE agent (>24hrs) due to surgical blood loss or risk of bleeding: not applicable

## 2023-07-12 NOTE — Progress Notes (Signed)
Progress Note   Patient: Marissa Gilmore ZOX:[id number] DOB: 31-Jul-1931 DOA: 07/08/2023     3 DOS: the patient was seen and examined on 07/12/2023   Brief hospital course: Marissa Gilmore is a 89 y.o. female with medical history significant for Chronic atrial fibrillation on Eliquis and amiodarone, systolic CHF, HTN and HLD who presented to the ED initially with a 2-day history of persistent vaginal bleeding including passage of golf ball size clots.  While in the emergency room, patient developed atrial fibrillation with RVR, then subsequently she developed left-sided facial droop and slurred speech. MRI brain showed subtle 1.5 cm focus of diffusion signal abnormality involving the right frontal corona radiata, suspicious for an acute ischemic infarct. MRA head: 1. Acute occlusion of a proximal right M2 branch, superior division. 2. Additional moderate proximal right M2 stenosis, inferior division. 3. Severe distal right P2 stenosis, with additional moderate left P2 stenosis. 4. Focal severe right A1 stenosis. Patient is seen by neurology, due to large amount of vaginal bleeding, anticoagulation was not able to start. Patient continued to have vaginal bleeding while in the hospital, not able to start anticoagulation.   Principal Problem:   Acute CVA (cerebrovascular accident) (HCC) Active Problems:   ABLA (acute blood loss anemia)   Hypotension   AKI (acute kidney injury) (HCC)   Chronic systolic (congestive) heart failure (HCC)   Chronic anticoagulation   Post-menopausal bleeding   Atrial fibrillation with rapid ventricular response (HCC)   Acute arterial ischemic stroke, multifocal, anterior circulation, right (HCC)   B12 deficiency anemia   Malnutrition of moderate degree   Hypernatremia   Assessment and Plan: * Acute CVA (cerebrovascular accident) (HCC) Dysphagia secondary to CVA. Patient had a single stroke in the right ACA distribution.  Given multiple intracranial  arterial obstructions, this most likely ischemic stroke.  However, due to large vaginal bleeding, not able to start antiplatelet antiplatelets or Eliquis. Continue statin. Patient has worsening aphasia and left-sided weakness. Patient also being seen by speech therapy, she has a severe dysphagia.  Diet modified. Patient also had very poor appetite, very little p.o. intake.  Offered NG tube for tube feeding, patient still refused it.    ABLA (acute blood loss anemia) Postmenopausal bleeding B12 deficient anemia. Patient so far has received 3 units of PRBC, she will also give a dose of IV iron given large amount of bleeding.  She also has B12 deficiency, started on B12 supplement. Initially placed on progesterone 200 mg daily as recommended by GYN after discussion with OB/GYN.  But bleeding does not seem to be improving. Have reached out to OB/GYN again 9/5, per Dr. Jean Rosenthal, there is nothing he can do other than switching current progesterone to Provera at 20 mg 3 times a day.  Patient probably has a cancer.   Patient still has significant bleeding, will give another dose of IV iron.  Discussed with OB/GYN again today, they may consider a D&C.  Will place patient n.p.o. pending procedure.   AKI (acute kidney injury) on chronic kidney disease stage IIIa.   Mild metabolic acidosis. Renal function has been better after fluids.   Atrial fibrillation with rapid ventricular response (HCC) Hypotension. Not able to start anticoagulation due to large vaginal bleeding. Blood pressure seem to be better after initial fluids.   Patient also received a 0.5 mg of digoxin on 9/4 diltiazem 30 mg every 8 hours for RVR. Heart rate under control.     Chronic systolic (congestive) heart failure (HCC) EF 30%  2021 Clinically euvolemic Holding torsemide, spironolactone, Entresto for relative low blood pressure.  Metoprolol restarted for HR control.   Nonsustained VT. Patient had 5 beats of VT 9/5, no  recurrence.     Subjective:  Patient still has significant vaginal bleeding. She is aphasic, with left-sided weakness. She tried to eat better.  Physical Exam: Vitals:   07/12/23 0003 07/12/23 0404 07/12/23 0526 07/12/23 0807  BP: (!) 123/53 137/64 (!) 148/72 (!) 131/57  Pulse: 90 87  86  Resp: 20 20  18   Temp: 97.8 F (36.6 C) 97.6 F (36.4 C)  98 F (36.7 C)  TempSrc: Oral Oral    SpO2: 99% 98%  100%  Weight:      Height:       General exam: Appears calm and comfortable  Respiratory system: Clear to auscultation. Respiratory effort normal. Cardiovascular system: Irregular. No JVD, murmurs, rubs, gallops or clicks. No pedal edema. Gastrointestinal system: Abdomen is nondistended, soft and nontender. No organomegaly or masses felt. Normal bowel sounds heard. Central nervous system: Alert and oriented x3.  Aphasic, left-sided weakness. Extremities: Symmetric 5 x 5 power. Skin: No rashes, lesions or ulcers Psychiatry:  Mood & affect appropriate.    Data Reviewed:  Lab results reviewed.  Family Communication: Long discussion with patient and niece, all questions answered.  Disposition: Status is: Inpatient Remains inpatient appropriate because: Severity of disease, IV treatment.     Time spent: 50 minutes  Author: Marrion Coy, MD 07/12/2023 11:19 AM  For on call review [email or web address].

## 2023-07-12 NOTE — Progress Notes (Signed)
Speech Language Pathology Treatment: Dysphagia;Cognitive-Linquistic  Patient Details Name: Marissa Gilmore MRN: [id number] DOB: 27-Aug-1931 Today's Date: 07/12/2023 Time: 0938-1000 SLP Time Calculation (min) (ACUTE ONLY): 22 min  Assessment / Plan / Recommendation Clinical Impression  Skilled treatment session focused on pt's dysphagia and dysarthria goals. SLP provided skilled observed of pt consuming her dysphagia 1 breakfast with nectar thick liquids via cup. Pt benefited from max to moderate cues to place spoon and cup on her right side in effort to decrease left sulci residue and left labial residue. Despite these cues, moderate amounts of nectar and puree escaped her mouth with widespread oral residue present after consuming bites of puree. Limited lingual movement observed to clear residue of puree - uncertain if this was related to taste or limited oral abilities. SLP further facilitated session by providing a straw that had been cut in half in pt's nectar cup. Pt was able to place straw on her right, draw nectar thru straw with appearance of timely swallow and no labial escape observed. SLP further provided trials of thin liquids using half straw. While pt was able to draw liquid thru straw, labial escape of bolus was observed. SLP had pt attempt drawing thin liquids thru regular length straw. While she was able to do this and she didn't have any overt s/s of aspiration during this consumption, it was greatly fatiguing. At this time, recommend continue dysphagia 1 with nectar thick liquids - USE STRAW CUT IN HALF - hopeful for return to consuming thin liquids in near future.   SLP also targeted pt's dysarthria goals in this session. Pt's speech intelligibility appears much improved over previous date. Pt benefited from moderate cues to increase loudness to achieve ~ 75% intelligibility at the phrase to basic sentence level.   Education provided to pt's niece who was present throughout session.      HPI HPI: Pt is a 89 year old female with a history of CHF, HTN, paroxysmal atrial fibrillation on Eliquis who presented to the ED this morning with Afib with RVR and reports of bleeding.  Patient reports that for the past 2 days she has been dealing with persistent vaginal bleeding and passing clots about the size of a golf ball. She was placed on a cardizem drip but then at 18:45 she became hypotensive and weak on the Left side. Neurology has consulted pt and assessed Left sided weakness w/ improvement of her s/s since initial complaint.  Pt and friend stated the s/s "come and go" -- MD and NSG were informed of this and the presence of Left OM weakenss w/ Dysarthria and Dysphagia at the time of this BSE/SLE (1400 on 07/09/2023).   Imaging today: MRI HEAD IMPRESSION:     1. Subtle 1.5 cm focus of diffusion signal abnormality involving the  right frontal corona radiata, suspicious for an acute ischemic  infarct. No associated hemorrhage or mass effect.  2. Underlying minor chronic microvascular ischemic disease with tiny  remote right occipital and left cerebellar infarcts.     MRA HEAD IMPRESSION:     1. Acute occlusion of a proximal right M2 branch, superior division.  2. Additional moderate proximal right M2 stenosis, inferior  division.  3. Severe distal right P2 stenosis, with additional moderate left P2  stenosis.  4. Focal severe right A1 stenosis.      SLP Plan  Continue with current plan of care      Recommendations for follow up therapy are one component of a multi-disciplinary discharge  planning process, led by the attending physician.  Recommendations may be updated based on patient status, additional functional criteria and insurance authorization.    Recommendations  Diet recommendations: Dysphagia 1 (puree);Nectar-thick liquid Liquids provided via: Straw (straw cut in half) Medication Administration: Crushed with puree Supervision: Full supervision/cueing for compensatory  strategies;Trained caregiver to feed patient Compensations: Minimize environmental distractions;Slow rate;Small sips/bites;Lingual sweep for clearance of pocketing;Monitor for anterior loss Postural Changes and/or Swallow Maneuvers: Out of bed for meals;Seated upright 90 degrees;Upright 30-60 min after meal                  Oral care before and after PO;Staff/trained caregiver to provide oral care;Oral care QID   Frequent or constant Supervision/Assistance Dysphagia, oral phase (R13.11);Dysarthria and anarthria (R47.1)     Continue with current plan of care    Ileene Allie B. Dreama Saa, M.S., CCC-SLP, Tree surgeon Certified Brain Injury Specialist [facility]  [facility] Rehabilitation Services Office [phone] Ascom [phone] Fax [phone]

## 2023-07-12 NOTE — TOC Progression Note (Signed)
Transition of Care [facility]) - Progression Note    Patient Details  Name: Marissa Gilmore MRN: [id number] Date of Birth: 02-28-1931  [facility] CM/SW Contact  Liliana Cline, LCSW Phone Number: 07/12/2023, 3:30 PM  Clinical Narrative:    CSW met with patient and friend Armando Reichert at bedside. Provided list of current bed offers. They would like [facility]. CSW requested Admissions Workers at [facility] and [facility] to review the referral.    Expected Discharge Plan: Skilled Nursing Facility    Expected Discharge Plan and Services       Living arrangements for the past 2 months: Single Family Home                                       Social Determinants of Health (SDOH) Interventions SDOH Screenings   Food Insecurity: No Food Insecurity (07/09/2023)  Housing: Low Risk  (07/09/2023)  Transportation Needs: No Transportation Needs (07/09/2023)  Utilities: Not At Risk (07/09/2023)  Alcohol Screen: Low Risk  (09/16/2022)  Depression (PHQ2-9): Low Risk  (09/16/2022)  Financial Resource Strain: Low Risk  (09/16/2022)  Physical Activity: Insufficiently Active (09/16/2022)  Social Connections: Moderately Isolated (09/16/2022)  Stress: No Stress Concern Present (09/16/2022)  Tobacco Use: Low Risk  (07/09/2023)    Readmission Risk Interventions     No data to display

## 2023-07-12 NOTE — Op Note (Unsigned)
NAME: Tersigni, Marissa Gilmore ACCOUNT NO: [id number] DATE OF BIRTH: September 07, 1931 FACILITY: [facility] LOCATION: [facility] PHYSICIAN: Suzy Bouchard, MD  Operative Report   PREOPERATIVE DIAGNOSES: 1.  Postmenopausal bleeding. 2.  Menorrhagia. 3.  Anemia.  POSTOPERATIVE DIAGNOSES: 1.  Postmenopausal bleeding. 2.  Menorrhagia. 3.  Anemia. 4.  Fibroid uterus.  PROCEDURE:  Dilation and curettage.  SURGEON:  Suzy Bouchard, MD  ANESTHESIA:  General endotracheal anesthesia.  INDICATIONS:  A 89 year old gravida 0, patient with postmenopausal bleeding with significant bleeding leading to anemia.  The patient was recently diagnosed with a cerebrovascular accident.  The patient continued to have bleeding despite progesterone  treatment for the last 3 days.  DESCRIPTION OF PROCEDURE:  After adequate general endotracheal anesthesia, the patient was placed in dorsal supine position with legs in the [place] stirrups.  The patient's perineum and vagina were prepped and draped in normal sterile fashion.  Timeout  was performed.  The patient did receive 2 grams of IV Ancef prior to commencement of the procedure for surgical prophylaxis.  Straight catheterization of the bladder yielded 1000 mL of dark concentrated urine.  A weighted speculum was placed in the  vagina and the anterior cervix was grasped with a single tooth tenaculum.  Cervical os was dilated. Cervix dilated to #20 Hanks dilator.  Uterine sound performed with distortion of the endometrial cavity, sounds to 7 cm.  Sharp curettage was performed  with scant tissue retrieved.  Endometrial cavity distortion noted with the curettage.  Good hemostasis was noted.  Silver nitrate was applied on the single tooth tenaculum sites.  Procedure was terminated.  INTRAOPERATIVE FLUIDS:  200 mL  ESTIMATED BLOOD LOSS:  5 mL.  URINE OUTPUT:  1000 mL  The patient tolerated the procedure well and was taken to recovery room in  good condition.   SHY D: 07/12/2023 7:56:31 pm T: 07/12/2023 9:16:00 pm  JOB: [id number]/ [id number]

## 2023-07-12 NOTE — Consult Note (Signed)
Consult History and Physical   SERVICE: Gynecology unassigned GYN [facility]  Patient Name: Marissa Gilmore Patient MRN:   [id number]  CC: vaginal bleeding   HPI: Marissa Gilmore is a 89 y.o. G0P0000 with heavy vaginal bleeding  Pt admitted on 07/08/23 for vaginal bleeding and a subsequent CVA that occurred during ED visit .  Pt was placed on Progesterone since admission and recently Provera increased to 20 mg TID , but bleeding persists .  U/s shows a fibroid utx ( 50 cm , probably in error) And a thickened endometrium She has received 3 units of blood since admission and hct 24 recently .  Vaginal bleeding persists     Review of Systems: positives in bold GEN:   fevers, chills, weight changes, appetite changes, fatigue, night sweats HEENT:  HA, vision changes, hearing loss, congestion, rhinorrhea, sinus pressure, dysphagia CV:   CP, palpitations PULM:  SOB, cough GI:  abd pain, N/V/D/C GU:  dysuria, urgency, frequency, + vaginal bleeding MSK:  arthralgias, myalgias, back pain, swelling SKIN:  rashes, color changes, pallor NEURO:  numbness, weakness, tingling, seizures, dizziness, tremors PSYCH:  depression, anxiety, behavioral problems, confusion  HEME/LYMPH:  easy bruising or bleeding ENDO:  heat/cold intolerance  Past Obstetrical History: OB History     Gravida  0   Para  0   Term  0   Preterm  0   AB  0   Living  0      SAB  0   IAB  0   Ectopic  0   Multiple  0   Live Births  0           Past Gynecologic History:  Past Medical History: Past Medical History:  Diagnosis Date   Atrial fibrillation (HCC)    Cataract    Diastolic heart failure (HCC)    35% EF   Hypertension     Past Surgical History:   Past Surgical History:  Procedure Laterality Date   CATARACT EXTRACTION Right     Family History:  family history includes Bladder Cancer (age of onset: 83) in her father; Heart disease in her mother; Peripheral Artery Disease (age  of onset: 23) in her mother.  Social History:  Social History   Socioeconomic History   Marital status: Single    Spouse name: Not on file   Number of children: 0   Years of education: Not on file   Highest education level: 12th grade  Occupational History   Occupation: retired    Comment: Manufacturing engineer  Tobacco Use   Smoking status: Never   Smokeless tobacco: Never  Vaping Use   Vaping status: Never Used  Substance and Sexual Activity   Alcohol use: No   Drug use: No   Sexual activity: Not on file  Other Topics Concern   Not on file  Social History Narrative   Not on file   Social Determinants of Health   Financial Resource Strain: Low Risk  (09/16/2022)   Overall Financial Resource Strain (CARDIA)    Difficulty of Paying Living Expenses: Not hard at all  Food Insecurity: No Food Insecurity (07/09/2023)   Hunger Vital Sign    Worried About Running Out of Food in the Last Year: Never true    Ran Out of Food in the Last Year: Never true  Transportation Needs: No Transportation Needs (07/09/2023)   PRAPARE - Administrator, Civil Service (Medical): No    Lack of Transportation (Non-Medical):  No  Physical Activity: Insufficiently Active (09/16/2022)   Exercise Vital Sign    Days of Exercise per Week: 3 days    Minutes of Exercise per Session: 30 min  Stress: No Stress Concern Present (09/16/2022)   [employer] of Occupational Health - Occupational Stress Questionnaire    Feeling of Stress : Not at all  Social Connections: Moderately Isolated (09/16/2022)   Social Connection and Isolation Panel [NHANES]    Frequency of Communication with Friends and Family: Twice a week    Frequency of Social Gatherings with Friends and Family: Twice a week    Attends Religious Services: More than 4 times per year    Active Member of [employer] or Organizations: No    Attends Banker Meetings: Never    Marital Status: Never married  Intimate Partner  Violence: Not At Risk (07/09/2023)   Humiliation, Afraid, Rape, and Kick questionnaire    Fear of Current or Ex-Partner: No    Emotionally Abused: No    Physically Abused: No    Sexually Abused: No    Home Medications:  Medications reconciled in EPIC  No current facility-administered medications on file prior to encounter.   Current Outpatient Medications on File Prior to Encounter  Medication Sig Dispense Refill   amiodarone (PACERONE) 200 MG tablet Take 200 mg by mouth as directed. Take 1 tablet (200 mg total) by mouth as directed Take 1 tablet twice daily for 2 weeks. Thereafter take 1 tablet daily.     atorvastatin (LIPITOR) 40 MG tablet Take 1 tablet (40 mg total) by mouth daily. 90 tablet 3   ELIQUIS 2.5 MG TABS tablet SMARTSIG:1 Tablet(s) By Mouth Every 12 Hours     metoprolol tartrate (LOPRESSOR) 25 MG tablet Take 25 mg by mouth 2 (two) times daily.      potassium chloride (KLOR-CON) 20 MEQ packet Take 20 mEq by mouth daily.     sacubitril-valsartan (ENTRESTO) 24-26 MG Take 1 tablet by mouth 2 (two) times daily.      spironolactone (ALDACTONE) 25 MG tablet Take 12.5 mg by mouth daily.     torsemide (DEMADEX) 20 MG tablet Take 20 mg by mouth 2 (two) times daily.     furosemide (LASIX) 40 MG tablet TAKE 1 TABLET BY MOUTH EVERY DAY (Patient not taking: Reported on 07/09/2023) 90 tablet 1    Allergies:  No Known Allergies  Physical Exam:  Temp:  [97.6 F (36.4 C)-98.3 F (36.8 C)] 98 F (36.7 C) (09/07 0807) Pulse Rate:  [60-94] 86 (09/07 0807) Resp:  [18-20] 18 (09/07 0807) BP: (114-156)/(43-97) 131/57 (09/07 0807) SpO2:  [97 %-100 %] 100 % (09/07 0807)   General Appearance:  frail female , unable to speech , but seemingly  understands my discussion  HEENT:  Normocephalic atraumatic, extraocular movements intact, moist mucous membranes Skin:  normal coloration and turgor, no rashes, no suspicious skin lesions noted  Psychiatric:  Normal mood and affect, appropriate, no  AH/VH Pelvic: deferred  Abd exam palpates a 18 cm utx    Labs/Studies:   CBC and Coags:  Lab Results  Component Value Date   WBC 11.9 (H) 07/12/2023   NEUTOPHILPCT 82 07/08/2023   EOSPCT 1 07/08/2023   BASOPCT 0 07/08/2023   LYMPHOPCT 9 07/08/2023   HGB 8.4 (L) 07/12/2023   HCT 24.3 (L) 07/12/2023   MCV 91.4 07/12/2023   PLT 142 (L) 07/12/2023   INR 1.2 07/09/2023   CMP:  Lab Results  Component Value Date  NA 146 (H) 07/12/2023   K 4.6 07/12/2023   CL 117 (H) 07/12/2023   CO2 21 (L) 07/12/2023   BUN 30 (H) 07/12/2023   CREATININE 1.57 (H) 07/12/2023   CREATININE 1.57 (H) 07/11/2023   CREATININE 1.35 (H) 07/10/2023   PROT 6.5 02/06/2023   BILITOT 0.7 02/06/2023   ALT 19 02/06/2023   AST 15 02/06/2023   ALKPHOS 108 02/06/2023    Other Imaging: ECHOCARDIOGRAM COMPLETE  Result Date: 07/09/2023    ECHOCARDIOGRAM REPORT   Patient Name:   [facility] Date of Exam: 07/09/2023 Medical Rec #:  [id number]          Height:       64.0 in Accession #:    [id number]         Weight:       128.0 lb Date of Birth:  1931-07-13          BSA:          1.618 m Patient Age:    89 years           BP:           101/62 mmHg Patient Gender: F                  HR:           119 bpm. Exam Location:  [facility] Procedure: 2D Echo, Cardiac Doppler and Color Doppler Indications:     Stroke  History:         Patient has no prior history of Echocardiogram examinations.                  Stroke, Arrythmias:Atrial Fibrillation, Signs/Symptoms:Dyspnea                  and Fatigue; Risk Factors:Hypertension and Dyslipidemia.  Sonographer:     Mikki Harbor Referring Phys:  [id number] Andris Baumann Diagnosing Phys: [name] MD IMPRESSIONS  1. Left ventricular ejection fraction, by estimation, is 55 to 60%. The left ventricle has normal function. The left ventricle has no regional wall motion abnormalities. Left ventricular diastolic function could not be evaluated.  2. Right ventricular systolic function  is normal. The right ventricular size is normal. There is moderately elevated pulmonary artery systolic pressure.  3. Left atrial size was mildly dilated.  4. The mitral valve is degenerative. Mild to moderate mitral valve regurgitation.  5. Tricuspid valve regurgitation is moderate.  6. The aortic valve is normal in structure. Aortic valve regurgitation is trivial. FINDINGS  Left Ventricle: Left ventricular ejection fraction, by estimation, is 55 to 60%. The left ventricle has normal function. The left ventricle has no regional wall motion abnormalities. The left ventricular internal cavity size was normal in size. There is  no left ventricular hypertrophy. Left ventricular diastolic function could not be evaluated. Right Ventricle: The right ventricular size is normal. No increase in right ventricular wall thickness. Right ventricular systolic function is normal. There is moderately elevated pulmonary artery systolic pressure. The tricuspid regurgitant velocity is 3.32 m/s, and with an assumed right atrial pressure of 8 mmHg, the estimated right ventricular systolic pressure is 52.1 mmHg. Left Atrium: Left atrial size was mildly dilated. Right Atrium: Right atrial size was normal in size. Pericardium: There is no evidence of pericardial effusion. Mitral Valve: Eccentric posterior jet. The mitral valve is degenerative in appearance. Mild to moderate mitral valve regurgitation. MV peak gradient, 4.0 mmHg. The mean mitral valve gradient is 2.0  mmHg. Tricuspid Valve: Eccentric posterior jet. The tricuspid valve is grossly normal. Tricuspid valve regurgitation is moderate. Aortic Valve: The aortic valve is normal in structure. Aortic valve regurgitation is trivial. Aortic valve mean gradient measures 5.7 mmHg. Aortic valve peak gradient measures 11.9 mmHg. Aortic valve area, by VTI measures 1.57 cm. Pulmonic Valve: The pulmonic valve was normal in structure. Pulmonic valve regurgitation is not visualized. Aorta: The  ascending aorta was not well visualized. IAS/Shunts: No atrial level shunt detected by color flow Doppler.  LEFT VENTRICLE PLAX 2D LVIDd:         4.40 cm LVIDs:         3.10 cm LV PW:         1.10 cm LV IVS:        0.80 cm LVOT diam:     2.00 cm LV SV:         50 LV SV Index:   31 LVOT Area:     3.14 cm  RIGHT VENTRICLE RV Basal diam:  3.60 cm RV Mid diam:    2.70 cm RV S prime:     13.40 cm/s LEFT ATRIUM             Index        RIGHT ATRIUM           Index LA diam:        4.30 cm 2.66 cm/m   RA Area:     22.30 cm LA Vol (A2C):   93.7 ml 57.90 ml/m  RA Volume:   64.70 ml  39.98 ml/m LA Vol (A4C):   78.7 ml 48.63 ml/m LA Biplane Vol: 85.8 ml 53.02 ml/m  AORTIC VALVE                     PULMONIC VALVE AV Area (Vmax):    1.64 cm      PV Vmax:       1.32 m/s AV Area (Vmean):   1.44 cm      PV Peak grad:  7.0 mmHg AV Area (VTI):     1.57 cm AV Vmax:           172.67 cm/s AV Vmean:          110.000 cm/s AV VTI:            0.319 m AV Peak Grad:      11.9 mmHg AV Mean Grad:      5.7 mmHg LVOT Vmax:         90.20 cm/s LVOT Vmean:        50.450 cm/s LVOT VTI:          0.159 m LVOT/AV VTI ratio: 0.50  AORTA Ao Root diam: 3.00 cm MITRAL VALVE               TRICUSPID VALVE MV Area (PHT): 5.62 cm    TR Peak grad:   44.1 mmHg MV Area VTI:   2.93 cm    TR Vmax:        332.00 cm/s MV Peak grad:  4.0 mmHg MV Mean grad:  2.0 mmHg    SHUNTS MV Vmax:       1.00 m/s    Systemic VTI:  0.16 m MV Vmean:      58.3 cm/s   Systemic Diam: 2.00 cm MV Decel Time: 135 msec MV E velocity: 91.70 cm/s [name] MD Electronically signed by [name] MD Signature Date/Time: 07/09/2023/1:15:37  PM    Final    MR BRAIN WO CONTRAST  Addendum Date: 07/09/2023   ADDENDUM REPORT: 07/09/2023 03:11 ADDENDUM: In addition to the initially described findings, note is made of a 9 mm T1 hypointense lesion within the C4 vertebral body, indeterminate. This is described in the body of the report, omitted from the impression by mistake.  Further evaluation with dedicated MRI of the cervical spine, with and without contrast, suggested for further evaluation as warranted. Electronically Signed   By: Rise Mu M.D.   On: 07/09/2023 03:11   Result Date: 07/09/2023 CLINICAL DATA:  Initial evaluation for neuro deficit, stroke suspected. Left-sided deficit. EXAM: MRI HEAD WITHOUT CONTRAST MRA HEAD WITHOUT CONTRAST TECHNIQUE: Multiplanar, multi-echo pulse sequences of the brain and surrounding structures were acquired without intravenous contrast. Angiographic images of the Circle of Willis were acquired using MRA technique without intravenous contrast. COMPARISON:  Comparison made with prior CT from 07/08/2023. FINDINGS: MRI HEAD FINDINGS Brain: Cerebral volume within normal limits. Minimal hazy FLAIR signal abnormality noted involving the periventricular Milke matter, likely related to chronic microvascular ischemic disease, minimal in nature and less than is typically seen for age. Tiny remote infarct present at the right occipital lobe (series 20, image 21). Additional tiny remote left cerebellar infarct noted. Subtle focus of diffusion signal abnormality measuring approximately 1.5 cm seen at the right frontal corona radiata (series 10, image 78). Associated subtle signal loss on ADC map (series 11, image 30). Findings suspicious for a possible acute ischemic infarct. No associated hemorrhage or mass effect. No other evidence for acute or subacute ischemia. Gray-Ancrum matter differentiation otherwise maintained. No areas of chronic cortical infarction. No significant acute or chronic intracranial blood products. No mass lesion, midline shift or mass effect. No hydrocephalus or extra-axial fluid collection. Pituitary gland and suprasellar region within normal limits. Vascular: Major intracranial vascular flow voids are maintained. Skull and upper cervical spine: Craniocervical junction within normal limits. 9 mm T1 hypointense lesion noted  within the visualized C4 vertebral body (series 14, image 12), indeterminate. Bone marrow signal intensity otherwise normal. No scalp soft tissue abnormality. Sinuses/Orbits: Prior ocular lens replacement on the right. Globes and orbital soft tissues otherwise unremarkable. Paranasal sinuses are largely clear. No significant mastoid effusion. Other: None. MRA HEAD FINDINGS Anterior circulation: Visualized distal cervical segments of the internal carotid arteries are mildly tortuous but patent with antegrade flow. Petrous, cavernous, and supraclinoid segments patent. Left A1 segment widely patent. Focal severe right A1 stenosis (series 16109, image 8). Normal anterior communicating artery complex. Anterior cerebral arteries patent without stenosis. No M1 stenosis or occlusion. Left MCA branches well perfused. On the right, there is acute occlusion of a proximal right M2 branch, superior division (series 60454, image 13). Inferior division remains patent and perfused, although a probable moderate proximal right M2 stenosis noted (series 09811, image 12). Posterior circulation: Both vertebral arteries are patent to the vertebrobasilar junction without visible stenosis. Right PICA patent. Left PICA not well seen. Basilar patent without stenosis. Superior cerebral arteries patent bilaterally. Both PCAs primarily supplied via the basilar. Focal moderate left P2 stenosis (series 1021, image 2). Left PCA otherwise patent. On the right, there is a severe distal right P2 stenosis (series 1021, image 7). Right PCA is markedly attenuated distally and only faintly visible on 3D time-of-flight sequence. Anatomic variants: None significant.  No aneurysm. IMPRESSION: MRI HEAD IMPRESSION: 1. Subtle 1.5 cm focus of diffusion signal abnormality involving the right frontal corona radiata, suspicious for an acute ischemic  infarct. No associated hemorrhage or mass effect. 2. Underlying minor chronic microvascular ischemic disease with  tiny remote right occipital and left cerebellar infarcts. MRA HEAD IMPRESSION: 1. Acute occlusion of a proximal right M2 branch, superior division. 2. Additional moderate proximal right M2 stenosis, inferior division. 3. Severe distal right P2 stenosis, with additional moderate left P2 stenosis. 4. Focal severe right A1 stenosis. Electronically Signed: By: Rise Mu M.D. On: 07/09/2023 03:05   MR ANGIO HEAD WO CONTRAST  Addendum Date: 07/09/2023   ADDENDUM REPORT: 07/09/2023 03:11 ADDENDUM: In addition to the initially described findings, note is made of a 9 mm T1 hypointense lesion within the C4 vertebral body, indeterminate. This is described in the body of the report, omitted from the impression by mistake. Further evaluation with dedicated MRI of the cervical spine, with and without contrast, suggested for further evaluation as warranted. Electronically Signed   By: Rise Mu M.D.   On: 07/09/2023 03:11   Result Date: 07/09/2023 CLINICAL DATA:  Initial evaluation for neuro deficit, stroke suspected. Left-sided deficit. EXAM: MRI HEAD WITHOUT CONTRAST MRA HEAD WITHOUT CONTRAST TECHNIQUE: Multiplanar, multi-echo pulse sequences of the brain and surrounding structures were acquired without intravenous contrast. Angiographic images of the Circle of Willis were acquired using MRA technique without intravenous contrast. COMPARISON:  Comparison made with prior CT from 07/08/2023. FINDINGS: MRI HEAD FINDINGS Brain: Cerebral volume within normal limits. Minimal hazy FLAIR signal abnormality noted involving the periventricular [place] matter, likely related to chronic microvascular ischemic disease, minimal in nature and less than is typically seen for age. Tiny remote infarct present at the right occipital lobe (series 20, image 21). Additional tiny remote left cerebellar infarct noted. Subtle focus of diffusion signal abnormality measuring approximately 1.5 cm seen at the right frontal corona  radiata (series 10, image 78). Associated subtle signal loss on ADC map (series 11, image 30). Findings suspicious for a possible acute ischemic infarct. No associated hemorrhage or mass effect. No other evidence for acute or subacute ischemia. Gray-Stelle matter differentiation otherwise maintained. No areas of chronic cortical infarction. No significant acute or chronic intracranial blood products. No mass lesion, midline shift or mass effect. No hydrocephalus or extra-axial fluid collection. Pituitary gland and suprasellar region within normal limits. Vascular: Major intracranial vascular flow voids are maintained. Skull and upper cervical spine: Craniocervical junction within normal limits. 9 mm T1 hypointense lesion noted within the visualized C4 vertebral body (series 14, image 12), indeterminate. Bone marrow signal intensity otherwise normal. No scalp soft tissue abnormality. Sinuses/Orbits: Prior ocular lens replacement on the right. Globes and orbital soft tissues otherwise unremarkable. Paranasal sinuses are largely clear. No significant mastoid effusion. Other: None. MRA HEAD FINDINGS Anterior circulation: Visualized distal cervical segments of the internal carotid arteries are mildly tortuous but patent with antegrade flow. Petrous, cavernous, and supraclinoid segments patent. Left A1 segment widely patent. Focal severe right A1 stenosis (series 16109, image 8). Normal anterior communicating artery complex. Anterior cerebral arteries patent without stenosis. No M1 stenosis or occlusion. Left MCA branches well perfused. On the right, there is acute occlusion of a proximal right M2 branch, superior division (series 60454, image 13). Inferior division remains patent and perfused, although a probable moderate proximal right M2 stenosis noted (series 09811, image 12). Posterior circulation: Both vertebral arteries are patent to the vertebrobasilar junction without visible stenosis. Right PICA patent. Left PICA  not well seen. Basilar patent without stenosis. Superior cerebral arteries patent bilaterally. Both PCAs primarily supplied via the basilar. Focal moderate left  P2 stenosis (series 1021, image 2). Left PCA otherwise patent. On the right, there is a severe distal right P2 stenosis (series 1021, image 7). Right PCA is markedly attenuated distally and only faintly visible on 3D time-of-flight sequence. Anatomic variants: None significant.  No aneurysm. IMPRESSION: MRI HEAD IMPRESSION: 1. Subtle 1.5 cm focus of diffusion signal abnormality involving the right frontal corona radiata, suspicious for an acute ischemic infarct. No associated hemorrhage or mass effect. 2. 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 MRA HEAD IMPRESSION: 1. Acute occlusion of a proximal right M2 branch, superior division. 2. Additional moderate proximal right M2 stenosis, inferior division. 3. Severe distal right P2 stenosis, with additional moderate left P2 stenosis. 4. Focal severe right A1 stenosis. Electronically Signed: By: Rise Mu M.D. On: 07/09/2023 03:05   US PELVIC COMPLETE WITH TRANSVAGINAL  Result Date: 07/08/2023 CLINICAL DATA:  Vaginal bleeding in a 89 year old female EXAM: TRANSABDOMINAL ULTRASOUND OF PELVIS TECHNIQUE: Transabdominal ultrasound examination of the pelvis was performed including evaluation of the uterus, ovaries, adnexal regions, and pelvic cul-de-sac. Patient declined transvaginal exam. COMPARISON:  None Available. FINDINGS: Uterus Measurements: 50.3 x 6.6 x 8.6 cm = volume: 452 mL. Enlarged heterogenous uterus with multiple fibroids measuring up to 7.8 cm. Endometrium Thickness: 24 mm. Thickened heterogenous endometrium without definite focal lesion. Right ovary Not visualized due to enlarged heterogenous uterus and overlying bowel. Left ovary Not visualized due to enlarged heterogenous uterus and overlying. Other findings:   No abnormal free fluid. IMPRESSION: 1. Thickened heterogenous endometrium measuring up to 24 mm. In the setting of post-menopausal bleeding, endometrial sampling is indicated to exclude carcinoma. If results are benign, sonohysterogram should be considered for focal lesion work-up. (Ref: Radiological Reasoning: Algorithmic Workup of Abnormal Vaginal Bleeding with Endovaginal Sonography and Sonohysterography. AJR 2008; 403:K74-25) 2. Enlarged heterogenous uterus with multiple fibroids. 3. Nonvisualization of the ovaries. Electronically Signed   By: Minerva Fester M.D.   On: 07/08/2023 20:49   CT HEAD CODE STROKE WO CONTRAST  Result Date: 07/08/2023 CLINICAL DATA:  Code stroke.  Neuro deficit, acute, stroke suspected EXAM: CT HEAD WITHOUT CONTRAST TECHNIQUE: Contiguous axial images were obtained from the base of the skull through the vertex without intravenous contrast. RADIATION DOSE REDUCTION: This exam was performed according to the departmental dose-optimization program which includes automated exposure control, adjustment of the mA and/or kV according to patient size and/or use of iterative reconstruction technique. COMPARISON:  CT head 07/21/2017. FINDINGS: Brain: No evidence of acute infarction, hemorrhage, hydrocephalus, extra-axial collection or mass lesion/mass effect. Vascular: No hyperdense vessel or unexpected calcification. Skull: Normal. Negative for fracture or focal lesion. Sinuses/Orbits: No acute finding. Other: None. ASPECTS [facility] Stroke Program Early CT Score) Total score (0-10 with 10 being normal): 10. IMPRESSION: 1. No evidence of acute intracranial abnormality. 2. ASPECTS is 10. Code stroke imaging results were communicated on 07/08/2023 at 8:12 pm to provider Chesley Noon via telephone, who verbally acknowledged these results. Electronically Signed   By: Feliberto Harts M.D.   On: 07/08/2023 20:13     Assessment / Plan:   Marissa Gilmore is a 89 y.o. G0P0000 who presents  with heavy vaginal bleeding and recent CVA . Lining of the endometrium increased possible endometrial carcinoma. Fibroid uterus   1. Hospitalist feels she is stable for D+C and possible endometrial ablation( if her anatomy allows). Goal is for tissue diagnosis and stabilization of bleeding . Would cont provera post op at 20 mg every  day  The procedure and risk were explained with the patient and her family members . All questions answered . Placed in NPO status until surgery  Pt has signed the consent  60 minutes in patient care    Thank you for the opportunity to be involved with this pt's care.

## 2023-07-12 NOTE — Plan of Care (Signed)
  Problem: Education: Goal: Knowledge of disease or condition will improve 07/12/2023 1615 by Garwin Brothers, RN Outcome: Progressing 07/12/2023 1615 by Garwin Brothers, RN Outcome: Progressing Goal: Knowledge of secondary prevention will improve (MUST DOCUMENT ALL) 07/12/2023 1615 by Garwin Brothers, RN Outcome: Progressing 07/12/2023 1615 by Garwin Brothers, RN Outcome: Progressing Goal: Knowledge of patient specific risk factors will improve Loraine Leriche N/A or DELETE if not current risk factor) 07/12/2023 1615 by Garwin Brothers, RN Outcome: Progressing 07/12/2023 1615 by Garwin Brothers, RN Outcome: Progressing   Problem: Ischemic Stroke/TIA Tissue Perfusion: Goal: Complications of ischemic stroke/TIA will be minimized 07/12/2023 1615 by Garwin Brothers, RN Outcome: Progressing 07/12/2023 1615 by Garwin Brothers, RN Outcome: Progressing   Problem: Coping: Goal: Will verbalize positive feelings about self 07/12/2023 1615 by Garwin Brothers, RN Outcome: Progressing 07/12/2023 1615 by Garwin Brothers, RN Outcome: Progressing Goal: Will identify appropriate support needs 07/12/2023 1615 by Garwin Brothers, RN Outcome: Progressing 07/12/2023 1615 by Garwin Brothers, RN Outcome: Progressing   Problem: Health Behavior/Discharge Planning: Goal: Ability to manage health-related needs will improve 07/12/2023 1615 by Garwin Brothers, RN Outcome: Progressing 07/12/2023 1615 by Garwin Brothers, RN Outcome: Progressing Goal: Goals will be collaboratively established with patient/family 07/12/2023 1615 by Garwin Brothers, RN Outcome: Progressing 07/12/2023 1615 by Garwin Brothers, RN Outcome: Progressing   Problem: Self-Care: Goal: Ability to participate in self-care as condition permits will improve 07/12/2023 1615 by Garwin Brothers, RN Outcome: Progressing 07/12/2023 1615 by Garwin Brothers, RN Outcome: Progressing Goal: Verbalization of feelings and  concerns over difficulty with self-care will improve 07/12/2023 1615 by Garwin Brothers, RN Outcome: Progressing 07/12/2023 1615 by Garwin Brothers, RN Outcome: Progressing Goal: Ability to communicate needs accurately will improve 07/12/2023 1615 by Garwin Brothers, RN Outcome: Progressing 07/12/2023 1615 by Garwin Brothers, RN Outcome: Progressing   Problem: Nutrition: Goal: Risk of aspiration will decrease 07/12/2023 1615 by Garwin Brothers, RN Outcome: Progressing 07/12/2023 1615 by Garwin Brothers, RN Outcome: Progressing Goal: Dietary intake will improve 07/12/2023 1615 by Garwin Brothers, RN Outcome: Progressing 07/12/2023 1615 by Garwin Brothers, RN Outcome: Progressing   Problem: Education: Goal: Knowledge of General Education information will improve Description: Including pain rating scale, medication(s)/side effects and non-pharmacologic comfort measures 07/12/2023 1615 by Garwin Brothers, RN Outcome: Progressing 07/12/2023 1615 by Garwin Brothers, RN Outcome: Progressing

## 2023-07-13 ENCOUNTER — Encounter: Payer: Self-pay | Admitting: Obstetrics and Gynecology

## 2023-07-13 DIAGNOSIS — D62 Acute posthemorrhagic anemia: Secondary | ICD-10-CM | POA: Diagnosis not present

## 2023-07-13 DIAGNOSIS — I639 Cerebral infarction, unspecified: Secondary | ICD-10-CM | POA: Diagnosis not present

## 2023-07-13 DIAGNOSIS — N179 Acute kidney failure, unspecified: Secondary | ICD-10-CM | POA: Diagnosis not present

## 2023-07-13 LAB — BASIC METABOLIC PANEL
Anion gap: 6 (ref 5–15)
BUN: 34 mg/dL — ABNORMAL HIGH (ref 8–23)
CO2: 22 mmol/L (ref 22–32)
Calcium: 9.3 mg/dL (ref 8.9–10.3)
Chloride: 118 mmol/L — ABNORMAL HIGH (ref 98–111)
Creatinine, Ser: 1.76 mg/dL — ABNORMAL HIGH (ref 0.44–1.00)
GFR, Estimated: 27 mL/min — ABNORMAL LOW (ref >60)
Glucose, Bld: 139 mg/dL — ABNORMAL HIGH (ref 70–99)
Potassium: 4.6 mmol/L (ref 3.5–5.1)
Sodium: 146 mmol/L — ABNORMAL HIGH (ref 135–145)

## 2023-07-13 LAB — HEMOGLOBIN
Hemoglobin: 7.2 g/dL — ABNORMAL LOW (ref 12.0–15.0)
Hemoglobin: 9.8 g/dL — ABNORMAL LOW (ref 12.0–15.0)

## 2023-07-13 MED ORDER — SODIUM CHLORIDE 0.9% IV SOLUTION: Freq: Once | INTRAVENOUS | Status: AC

## 2023-07-13 MED ORDER — DEXTROSE 5 % IV SOLN: INTRAVENOUS | Status: DC

## 2023-07-13 NOTE — Progress Notes (Signed)
Speech Language Pathology Treatment: Dysphagia;Cognitive-Linquistic  Patient Details Name: Supreet Tim MRN: [id number] DOB: 1930-11-23 Today's Date: 07/13/2023 Time: 4098-1191 SLP Time Calculation (min) (ACUTE ONLY): 28 min  Assessment / Plan / Recommendation Clinical Impression  Skilled treatment session focused on pt's dysphagia and dysarthria goals. Pt alert and cooperative. Niece at bedsice.   SLP provided skilled observed of pt consuming puree, nectar-thick liquids, thin liquids, and ice chips.. Pt benefited from max to moderate cues to place spoon on R side in effort to decrease left sulci residue and left labial residue and anterior loss on L. Despite these cues, moderate-significant amounts of thin liquids, nectar and puree escaped her mouth. Anterior loss was most pronounced with thin liquids. It was also intermittent appreciated with ice chips and consistently appreciated with all other ice chips. Limited lingual movement observed to clear residue of puree - suspect due to lingual weakness. Inability to siphon liquids via straw and halfstraw noted this date despite cueing and facilitation.  At this time, recommend continue dysphagia 1 with nectar thick liquids - via tsp only.    SLP also targeted pt's dysarthria goals in this session. Pt's speech intelligibility appears much improved over previous date per chart review . Pt benefited from moderate cues to increase loudness to achieve ~ 80% intelligibility at the phrase to basic sentence level. Intelligibility reduced secondary to dysphonia (hypophonic, harsh at times), articulatory imprecision, and tendency to speak on residual volume. Niece and pt report a subjective improvement in pt's speech intelligibility.   Education provided to pt's niece who was present throughout session.    HPI HPI: Pt is a 89 year old female with a history of CHF, HTN, paroxysmal atrial fibrillation on Eliquis who presented to the ED this morning with Afib  with RVR and reports of bleeding.  Patient reports that for the past 2 days she has been dealing with persistent vaginal bleeding and passing clots about the size of a golf ball. She was placed on a cardizem drip but then at 18:45 she became hypotensive and weak on the Left side. Neurology has consulted pt and assessed Left sided weakness w/ improvement of her s/s since initial complaint.  Pt and friend stated the s/s "come and go" -- MD and NSG were informed of this and the presence of Left OM weakenss w/ Dysarthria and Dysphagia at the time of this BSE/SLE (1400 on 07/09/2023).   Imaging today: MRI HEAD IMPRESSION:     1. Subtle 1.5 cm focus of diffusion signal abnormality involving the  right frontal corona radiata, suspicious for an acute ischemic  infarct. No associated hemorrhage or mass effect.  2. Underlying minor chronic microvascular ischemic disease with tiny  remote right occipital and left cerebellar infarcts.     MRA HEAD IMPRESSION:     1. Acute occlusion of a proximal right M2 branch, superior division.  2. Additional moderate proximal right M2 stenosis, inferior  division.  3. Severe distal right P2 stenosis, with additional moderate left P2  stenosis.  4. Focal severe right A1 stenosis.      SLP Plan  Continue with current plan of care      Recommendations for follow up therapy are one component of a multi-disciplinary discharge planning process, led by the attending physician.  Recommendations may be updated based on patient status, additional functional criteria and insurance authorization.    Recommendations  Diet recommendations: Dysphagia 1 (puree);Nectar-thick liquid (ice chips OK in moderation for comfort) Liquids provided via: Teaspoon Medication  Administration: Crushed with puree Supervision: Full supervision/cueing for compensatory strategies;Trained caregiver to feed patient Compensations: Minimize environmental distractions;Slow rate;Small sips/bites;Lingual sweep for  clearance of pocketing;Monitor for anterior loss Postural Changes and/or Swallow Maneuvers: Out of bed for meals;Seated upright 90 degrees;Upright 30-60 min after meal                  Oral care before and after PO;Staff/trained caregiver to provide oral care;Oral care QID   Frequent or constant Supervision/Assistance Dysphagia, oral phase (R13.11);Dysarthria and anarthria (R47.1)     Continue with current plan of care    Clyde Canterbury, M.S., CCC-SLP Speech-Language Pathologist [facility] [phone] (ASCOM)  Woodroe Chen  07/13/2023, 11:59 AM

## 2023-07-13 NOTE — TOC Progression Note (Signed)
Transition of Care [facility]) - Progression Note    Patient Details  Name: Marissa Gilmore MRN: [id number] Date of Birth: 1931/09/25  [facility]) CM/SW Contact  Liliana Cline, LCSW Phone Number: 07/13/2023, 8:34 AM  Clinical Narrative:    Attempted to reach out to Tiffany at [facility] and [place] at [facility] again to ask them to reivew referral. Awaiting response. Will need auth.   Expected Discharge Plan: Skilled Nursing Facility    Expected Discharge Plan and Services       Living arrangements for the past 2 months: Single Family Home                                       Social Determinants of Health (SDOH) Interventions SDOH Screenings   Food Insecurity: No Food Insecurity (07/09/2023)  Housing: Low Risk  (07/09/2023)  Transportation Needs: No Transportation Needs (07/09/2023)  Utilities: Not At Risk (07/09/2023)  Alcohol Screen: Low Risk  (09/16/2022)  Depression (PHQ2-9): Low Risk  (09/16/2022)  Financial Resource Strain: Low Risk  (09/16/2022)  Physical Activity: Insufficiently Active (09/16/2022)  Social Connections: Moderately Isolated (09/16/2022)  Stress: No Stress Concern Present (09/16/2022)  Tobacco Use: Low Risk  (07/09/2023)    Readmission Risk Interventions     No data to display

## 2023-07-13 NOTE — Plan of Care (Signed)
  Problem: Education: Goal: Knowledge of disease or condition will improve Outcome: Progressing Goal: Knowledge of secondary prevention will improve (MUST DOCUMENT ALL) Outcome: Progressing Goal: Knowledge of patient specific risk factors will improve (Mark N/A or DELETE if not current risk factor) Outcome: Progressing   Problem: Ischemic Stroke/TIA Tissue Perfusion: Goal: Complications of ischemic stroke/TIA will be minimized Outcome: Progressing   Problem: Coping: Goal: Will verbalize positive feelings about self Outcome: Progressing Goal: Will identify appropriate support needs Outcome: Progressing   Problem: Health Behavior/Discharge Planning: Goal: Ability to manage health-related needs will improve Outcome: Progressing Goal: Goals will be collaboratively established with patient/family Outcome: Progressing   Problem: Self-Care: Goal: Ability to participate in self-care as condition permits will improve Outcome: Progressing Goal: Verbalization of feelings and concerns over difficulty with self-care will improve Outcome: Progressing Goal: Ability to communicate needs accurately will improve Outcome: Progressing   Problem: Nutrition: Goal: Risk of aspiration will decrease Outcome: Progressing Goal: Dietary intake will improve Outcome: Progressing   Problem: Education: Goal: Knowledge of General Education information will improve Description: Including pain rating scale, medication(s)/side effects and non-pharmacologic comfort measures Outcome: Progressing   

## 2023-07-13 NOTE — Anesthesia Postprocedure Evaluation (Signed)
Anesthesia Post Note  Patient: Marissa Gilmore  Procedure(s) Performed: DILATATION AND CURETTAGE  Patient location during evaluation: PACU Anesthesia Type: General Level of consciousness: awake and alert Pain management: pain level controlled Vital Signs Assessment: post-procedure vital signs reviewed and stable Respiratory status: spontaneous breathing, nonlabored ventilation, respiratory function stable and patient connected to nasal cannula oxygen Cardiovascular status: blood pressure returned to baseline and stable Postop Assessment: no apparent nausea or vomiting Anesthetic complications: no   No notable events documented.   Last Vitals:  Vitals:   07/13/23 0327 07/13/23 0328  BP: (!) 129/57 (!) 129/57  Pulse: (!) 59 69  Resp: 18 18  Temp: 37.1 C 37.1 C  SpO2: 95% 96%    Last Pain:  Vitals:   07/13/23 0328  TempSrc: Oral  PainSc:                  [name]

## 2023-07-13 NOTE — Plan of Care (Signed)
  Problem: Education: Goal: Knowledge of disease or condition will improve 07/13/2023 1607 by Garwin Brothers, RN Outcome: Progressing 07/13/2023 1604 by Garwin Brothers, RN Outcome: Progressing Goal: Knowledge of secondary prevention will improve (MUST DOCUMENT ALL) 07/13/2023 1607 by Garwin Brothers, RN Outcome: Progressing 07/13/2023 1604 by Garwin Brothers, RN Outcome: Progressing Goal: Knowledge of patient specific risk factors will improve Loraine Leriche N/A or DELETE if not current risk factor) 07/13/2023 1607 by Garwin Brothers, RN Outcome: Progressing 07/13/2023 1604 by Garwin Brothers, RN Outcome: Progressing   Problem: Ischemic Stroke/TIA Tissue Perfusion: Goal: Complications of ischemic stroke/TIA will be minimized 07/13/2023 1607 by Garwin Brothers, RN Outcome: Progressing 07/13/2023 1604 by Garwin Brothers, RN Outcome: Progressing   Problem: Coping: Goal: Will verbalize positive feelings about self 07/13/2023 1607 by Garwin Brothers, RN Outcome: Progressing 07/13/2023 1604 by Garwin Brothers, RN Outcome: Progressing Goal: Will identify appropriate support needs 07/13/2023 1607 by Garwin Brothers, RN Outcome: Progressing 07/13/2023 1604 by Garwin Brothers, RN Outcome: Progressing   Problem: Health Behavior/Discharge Planning: Goal: Ability to manage health-related needs will improve 07/13/2023 1607 by Garwin Brothers, RN Outcome: Progressing 07/13/2023 1604 by Garwin Brothers, RN Outcome: Progressing Goal: Goals will be collaboratively established with patient/family 07/13/2023 1607 by Garwin Brothers, RN Outcome: Progressing 07/13/2023 1604 by Garwin Brothers, RN Outcome: Progressing   Problem: Self-Care: Goal: Ability to participate in self-care as condition permits will improve 07/13/2023 1607 by Garwin Brothers, RN Outcome: Progressing 07/13/2023 1604 by Garwin Brothers, RN Outcome: Progressing Goal: Verbalization of feelings and  concerns over difficulty with self-care will improve 07/13/2023 1607 by Garwin Brothers, RN Outcome: Progressing 07/13/2023 1604 by Garwin Brothers, RN Outcome: Progressing Goal: Ability to communicate needs accurately will improve 07/13/2023 1607 by Garwin Brothers, RN Outcome: Progressing 07/13/2023 1604 by Garwin Brothers, RN Outcome: Progressing   Problem: Nutrition: Goal: Risk of aspiration will decrease 07/13/2023 1607 by Garwin Brothers, RN Outcome: Progressing 07/13/2023 1604 by Garwin Brothers, RN Outcome: Progressing   Problem: Education: Goal: Knowledge of General Education information will improve Description: Including pain rating scale, medication(s)/side effects and non-pharmacologic comfort measures 07/13/2023 1607 by Garwin Brothers, RN Outcome: Progressing 07/13/2023 1604 by Garwin Brothers, RN Outcome: Progressing   Problem: Nutrition: Goal: Dietary intake will improve 07/13/2023 1607 by Garwin Brothers, RN Outcome: Not Progressing 07/13/2023 1604 by Garwin Brothers, RN Outcome: Progressing

## 2023-07-13 NOTE — Progress Notes (Signed)
Progress Note   Patient: Marissa Gilmore ION:[id number] DOB: [birth year] DOA: 07/08/2023     4 DOS: the patient was seen and examined on 07/13/2023   Brief hospital course: Etosha Hrbek is a 89 y.o. female with medical history significant for Chronic atrial fibrillation on Eliquis and amiodarone, systolic CHF, HTN and HLD who presented to the ED initially with a 2-day history of persistent vaginal bleeding including passage of golf ball size clots.  While in the emergency room, patient developed atrial fibrillation with RVR, then subsequently she developed left-sided facial droop and slurred speech. MRI brain showed subtle 1.5 cm focus of diffusion signal abnormality involving the right frontal corona radiata, suspicious for an acute ischemic infarct. MRA head: 1. Acute occlusion of a proximal right M2 branch, superior division. 2. Additional moderate proximal right M2 stenosis, inferior division. 3. Severe distal right P2 stenosis, with additional moderate left P2 stenosis. 4. Focal severe right A1 stenosis. Patient is seen by neurology, due to large amount of vaginal bleeding, anticoagulation was not able to start. Patient continued to have vaginal bleeding while in the hospital, not able to start anticoagulation. D&C was performed on 9/7 for vaginal bleeding.   Principal Problem:   Acute CVA (cerebrovascular accident) (HCC) Active Problems:   ABLA (acute blood loss anemia)   Hypotension   AKI (acute kidney injury) (HCC)   Chronic systolic (congestive) heart failure (HCC)   Chronic anticoagulation   Post-menopausal bleeding   Atrial fibrillation with rapid ventricular response (HCC)   Acute arterial ischemic stroke, multifocal, anterior circulation, right (HCC)   B12 deficiency anemia   Malnutrition of moderate degree   Hypernatremia   Assessment and Plan:  * Acute CVA (cerebrovascular accident) (HCC) Dysphagia secondary to CVA. Patient had a single stroke in the right  ACA distribution.  Given multiple intracranial arterial obstructions, this most likely ischemic stroke.  However, due to large vaginal bleeding, not able to start antiplatelet antiplatelets or Eliquis. Continue statin. Patient also being seen by speech therapy, she has a severe dysphagia.  Diet modified. Patient still has significant aphasia and left-sided weakness.  Continue to follow.     ABLA (acute blood loss anemia) Postmenopausal bleeding B12 deficient anemia. Patient so far has received 3 units of PRBC, she will also give a dose of IV iron given large amount of bleeding.  She also has B12 deficiency, started on B12 supplement. Initially placed on progesterone 200 mg daily as recommended by GYN after discussion with OB/GYN.  But bleeding does not seem to be improving. Have reached out to OB/GYN again 9/5, per Dr. Jean Rosenthal, tswitching current progesterone to Provera at 20 mg 3 times a day.  Patient had receiving high dose of B12, 2 doses of IV iron.  He went to [facility] on 9/7.  Hemoglobin had dropped down to 7.2 again, receiving the fourth units of PRBC today.   AKI (acute kidney injury) on chronic kidney disease stage IIIa.   Mild metabolic acidosis. Hypernatremia. Renal function is worse than baseline, developed hypernatremia due to poor p.o. intake.  Metabolic acidosis has resolved.  Will start D5 water at 50 mL/h.   Atrial fibrillation with rapid ventricular response (HCC) Hypotension. Not able to start anticoagulation due to large vaginal bleeding. Blood pressure seem to be better after initial fluids.   Patient also received a 0.5 mg of digoxin on 9/4 diltiazem 30 mg every 8 hours for RVR. Heart rate is better controlled today.   Chronic systolic (congestive) heart failure (  HCC) EF 30% 2021 Clinically euvolemic Holding torsemide, spironolactone, Entresto for relative low blood pressure.  Metoprolol restarted for HR control.   Nonsustained VT. Patient had 5 beats of VT 9/5, no  recurrence.       Subjective:  Patient still has significant aphasia, left-sided weakness. Appetite is still poor. Still has small amount of vaginal bleeding.  Physical Exam: Vitals:   07/13/23 0328 07/13/23 0741 07/13/23 1125 07/13/23 1202  BP: (!) 129/57 131/65 100/86 (!) 120/59  Pulse: 69   (!) 59  Resp: 18 20 18  (!) 24  Temp: 98.8 F (37.1 C) (!) 97.5 F (36.4 C) 97.6 F (36.4 C) 97.7 F (36.5 C)  TempSrc: Oral   Oral  SpO2: 96% 96% 93% 99%  Weight:      Height:       General exam: Appears calm and comfortable  Respiratory system: Clear to auscultation. Respiratory effort normal. Cardiovascular system: S1 & S2 heard, RRR. No JVD, murmurs, rubs, gallops or clicks. No pedal edema. Gastrointestinal system: Abdomen is nondistended, soft and nontender. No organomegaly or masses felt. Normal bowel sounds heard. Central nervous system: Alert and oriented.  Aphasia and left-sided weakness. Extremities: Symmetric 5 x 5 power. Skin: No rashes, lesions or ulcers Psychiatry: Judgement and insight appear normal. Mood & affect appropriate.    Data Reviewed:  Lab results reviewed.  Family Communication: None  Disposition: Status is: Inpatient Remains inpatient appropriate because: Severity of disease, IV treatment.     Time spent: 35 minutes  Author: Marrion Coy, MD 07/13/2023 12:28 PM  For on call review [email or web address].

## 2023-07-14 DIAGNOSIS — D62 Acute posthemorrhagic anemia: Secondary | ICD-10-CM | POA: Diagnosis not present

## 2023-07-14 DIAGNOSIS — N950 Postmenopausal bleeding: Secondary | ICD-10-CM | POA: Diagnosis not present

## 2023-07-14 DIAGNOSIS — I639 Cerebral infarction, unspecified: Secondary | ICD-10-CM | POA: Diagnosis not present

## 2023-07-14 LAB — HEMOGLOBIN
Hemoglobin: 8.2 g/dL — ABNORMAL LOW (ref 12.0–15.0)
Hemoglobin: 8 g/dL — ABNORMAL LOW (ref 12.0–15.0)

## 2023-07-14 LAB — BASIC METABOLIC PANEL
Anion gap: 7 (ref 5–15)
BUN: 38 mg/dL — ABNORMAL HIGH (ref 8–23)
CO2: 22 mmol/L (ref 22–32)
Calcium: 9.6 mg/dL (ref 8.9–10.3)
Chloride: 117 mmol/L — ABNORMAL HIGH (ref 98–111)
Creatinine, Ser: 1.63 mg/dL — ABNORMAL HIGH (ref 0.44–1.00)
GFR, Estimated: 30 mL/min — ABNORMAL LOW (ref >60)
Glucose, Bld: 143 mg/dL — ABNORMAL HIGH (ref 70–99)
Potassium: 4.2 mmol/L (ref 3.5–5.1)
Sodium: 146 mmol/L — ABNORMAL HIGH (ref 135–145)

## 2023-07-14 LAB — MAGNESIUM: Magnesium: 2.8 mg/dL — ABNORMAL HIGH (ref 1.7–2.4)

## 2023-07-14 LAB — PREPARE RBC (CROSSMATCH)

## 2023-07-14 MED ORDER — SODIUM CHLORIDE 0.9 % IV SOLN
300.0000 mg | Freq: Once | INTRAVENOUS | Status: AC
  Administered 2023-07-14: 300 mg via INTRAVENOUS
  Filled 2023-07-14: qty 300

## 2023-07-14 NOTE — Care Management Important Message (Signed)
Important Message  Patient Details  Name: Marissa Gilmore MRN: [id number] Date of Birth: 11/09/1930   Medicare Important Message Given:  Yes     Olegario Messier A Eithen Castiglia 07/14/2023, 12:51 PM

## 2023-07-14 NOTE — Evaluation (Signed)
Occupational Therapy Re-Evaluation Patient Details Name: Marissa Gilmore MRN: [id number] DOB: June 14, 1931 Today's Date: 07/14/2023   History of Present Illness Marissa Gilmore is a 89 y.o. female with medical history significant for Chronic atrial fibrillation on Eliquis and amiodarone, systolic CHF, HTN and HLD who presented to the ED initially with a 2-day history of persistent vaginal bleeding including passage of golf ball size clots.   While in the emergency room, patient developed atrial fibrillation with RVR, then subsequently she developed left-sided facial droop and slurred speech.  MRI brain showed subtle 1.5 cm focus of diffusion signal abnormality involving the right frontal corona radiata, suspicious for an acute ischemic infarct. Patient with L side weakness and aphasia.   Clinical Impression   Pt seen for re-eval with PT to maximize pt outcomes and for pt/therapist safety. Of note, pt requires +2 assist for bed mobility, anticipate MAX A for LB ADLs with MOD-MAXA for UB ADLs. Pt presents with expressive aphasia, L hemibody weakness + inattention, L shoulder subluxation (less than 1 finger width), decreased orientation to midline with L lateral lean and poor seated balance requiring frequent cuing to correct. L UE presents with edema, temperature changes and minimal activation proximally / distally. Pt can perform scapular elevation/depression with min vcs, and has some distal digit activation. Pt able to tolerate sitting EOB for prolonged period to work on ADLs such as grooming, and participated well with trunk control / postural challenge tasks given. Goals updated this date. Does respond well to multimodal cuing for midline and upright posture (potential Pusher's Syndrome). Denies visual changes. Pt left in bed with family present, LUE positioned in pillow for decreased edema and comfort.       If plan is discharge home, recommend the following: A lot of help with walking and/or  transfers;A lot of help with bathing/dressing/bathroom;Assistance with cooking/housework;Assist for transportation;Help with stairs or ramp for entrance       Equipment Recommendations  Other (comment);BSC/3in1       Precautions / Restrictions Precautions Precautions: Fall Precaution Comments: Low Hgb from vaginal bleeding, L Hemi, nectar thick liquids Restrictions Weight Bearing Restrictions: No      Mobility Bed Mobility Overal bed mobility: Needs Assistance Bed Mobility: Supine to Sit, Sit to Supine     Supine to sit: Max assist, +2 for physical assistance, HOB elevated Sit to supine: Max assist, +2 for safety/equipment   General bed mobility comments: max-total +2 for bed mobility and scooting towards HOB. Decreased L sided awareness with L lateral lean    Transfers                   General transfer comment: did not attempt      Balance Overall balance assessment: Needs assistance Sitting-balance support: Single extremity supported, Feet supported Sitting balance-Leahy Scale: Poor Sitting balance - Comments: L trunk lean in sitting edge of bed. Initially requiring mod A for midline balance progressing to cga. She is able to self correct with cues, but unable to maintain midline for long due to fatigue. Postural control: Left lateral lean Standing balance support: During functional activity, Bilateral upper extremity supported Standing balance-Leahy Scale: Poor Standing balance comment: Unable to assess due to increase fatigue and safety concerns                           ADL either performed or assessed with clinical judgement   ADL Overall ADL's : Needs assistance/impaired Eating/Feeding: Minimal assistance;Cueing  for compensatory techinques Eating/Feeding Details (indicate cue type and reason): pt indicates she would like a drink, aware of her swallowing precautions and good return demo throuhgout small sips of nectar thick liquid Grooming:  Brushing hair;Sitting;Minimal assistance Grooming Details (indicate cue type and reason): cuing for upright posture and orientation to midline                               General ADL Comments: Session focused on dynamic sitting balance EOB with orientation to midline. Pt with L lateral lean, L hemiparesis and decreased L attention which impacts ADL performance. Pt with good efforts throughout, does require multimodal cuing for midline orientation and upright posture.     Vision   Vision Assessment?: No apparent visual deficits Additional Comments: pt denies visual changes     Perception Perception: Impaired Preception Impairment Details: Inattention/Neglect Perception-Other Comments: L side       Pertinent Vitals/Pain Pain Assessment Pain Assessment: No/denies pain     Extremity/Trunk Assessment Upper Extremity Assessment Upper Extremity Assessment: LUE deficits/detail LUE Deficits / Details: scapular elevation/depression present, minimal distal activation in digits. Sent secure chat to RN/MD regarding distal edema and cold temperature (potentially from IV) in UE. LUE: Subluxation noted LUE Sensation:  (pt verbalizes sensation intact) LUE Coordination: decreased gross motor;decreased fine motor   Lower Extremity Assessment Lower Extremity Assessment: Generalized weakness       Communication Communication Communication: Difficulty communicating thoughts/reduced clarity of speech Cueing Techniques: Verbal cues   Cognition Arousal: Alert Behavior During Therapy: WFL for tasks assessed/performed Overall Cognitive Status: Difficult to assess                                 General Comments: Pt demonstrates expressive aphasia, although speech production improved this session        Exercises Exercises: Other exercises Other Exercises Other Exercises: dynamic balance challenges, postural control with weight shifts in sitting Other Exercises:  LUE positioned on pillow to assist with decreasing edema when pt back in bed                  OT Goals(Current goals can be found in the care plan section) Acute Rehab OT Goals OT Goal Formulation: With patient Time For Goal Achievement: 07/23/23 Potential to Achieve Goals: Fair  OT Frequency: Min 1X/week       AM-PAC OT "6 Clicks" Daily Activity     Outcome Measure Help from another person eating meals?: A Little Help from another person taking care of personal grooming?: A Little Help from another person toileting, which includes using toliet, bedpan, or urinal?: Total Help from another person bathing (including washing, rinsing, drying)?: A Lot Help from another person to put on and taking off regular upper body clothing?: A Lot Help from another person to put on and taking off regular lower body clothing?: A Lot 6 Click Score: 13   End of Session Nurse Communication: Other (comment) (LUE presentation)  Activity Tolerance: Patient tolerated treatment well Patient left: in bed;with call bell/phone within reach;with bed alarm set  OT Visit Diagnosis: Hemiplegia and hemiparesis;Unsteadiness on feet (R26.81);Muscle weakness (generalized) (M62.81) Hemiplegia - Right/Left: Left Hemiplegia - dominant/non-dominant: Non-Dominant Hemiplegia - caused by: Cerebral infarction                Time: 4034-7425 OT Time Calculation (min): 40 min Charges:  OT General Charges $  OT Visit: 1 Visit OT Evaluation $OT Re-eval: 1 Re-eval OT Treatments $Self Care/Home Management : 8-22 mins  Marissa Gilmore, OTR/L  07/14/23, 3:42 PM

## 2023-07-14 NOTE — Progress Notes (Signed)
Speech Language Pathology Treatment: Dysphagia  Patient Details Name: Marissa Gilmore MRN: [id number] DOB: 03/09/1931 Today's Date: 07/14/2023 Time: 4098-1191 SLP Time Calculation (min) (ACUTE ONLY): 45 min  Assessment / Plan / Recommendation Clinical Impression  Pt seen today for ongoing assessment of swallowing; toleration of diet this morning. Niece present in room. Pt awake, verbal w/ MODERATE-SEVERE Dysarthria noted. A/O; followed general instructions. Pt endorsed she was "tired" after not sleeping last night d/t increased "noise"; pt is in a double room.  On RA, afebrile. WBC elevated today.   ST services provided skilled observation and introduction of positioning strategy during pt's oral intake of trials of Nectar liquids via tsp/cup and puree. She benefited from mod verbal/tactile cues and visual model to place spoon on R side of mouth and use a slight Head Tilt/turn Right in effort to promote Right oral motor bolus control and A-P transfer as well as to decrease Left sulci residue and anterior loss on L. Pt followed through w/ sweep/dry swallow also given cues/model.  Pt was able to follow through w/ the cues and strategies w/ some/min improvement in bolus management and swallowing. However, moderate anterior loss was still pronounced moreso w/ nectar liquids from Cup -- pt preferred to drink from the Cup. Improved bolus control was noted w/ the tsp, and use of Head Tilt Right. Pt continues to exhibit significantly reduced lingual movements to propel boluses posteriorly in a timely manner then also clear residue of the boluses, which was noted to pool on tongue and Left sulci. Pt followed instructions/model to use lingual sweeping and Dry swallow after each bolus trial which improved oral clearing but did not always clear it -- suspect due to lingual weakness.   Pt presents w/ increased risk for aspiration/aspiration pneumonia in setting of oropharyngeal phase Dysphagia; she also presents  w/ challenges meeting her nutrition/hydration needs orally d/t the effort of oral intake for her. Recommend continue Dysphagia 1 with Nectar thick liquids w/ strict aspiration precautions and swallowing strategies; oral care before/after po's. Tray setup and sitting up w/ positioning and feeding support. Pills CRUSHED in Puree. Briefly discussed the option of alternative means of feeding (PEG) d/t the continued dysphagia -- pt stated "No" to feeding tubes. This was relayed to both MD and Team; Palliative Care is following the pt currently (encouraged further discussion re: GOC in setting of her Dysphagia severity). MD agreed.  ST services will monitor pt's status next 1-2 days. NSG updated.      HPI HPI: Pt is a 89 year old female with a history of CHF, HTN, paroxysmal atrial fibrillation on Eliquis who presented to the ED this morning with Afib with RVR and reports of bleeding.  Patient reports that for the past 2 days she has been dealing with persistent vaginal bleeding and passing clots about the size of a golf ball. She was placed on a cardizem drip but then at 18:45 she became hypotensive and weak on the Left side. Neurology has consulted pt and assessed Left sided weakness w/ improvement of her s/s since initial complaint.  Pt and friend stated the s/s "come and go" -- MD and NSG were informed of this and the presence of Left OM weakenss w/ Dysarthria and Dysphagia at the time of this BSE/SLE (1400 on 07/09/2023).   Imaging today: MRI HEAD IMPRESSION:     1. Subtle 1.5 cm focus of diffusion signal abnormality involving the  right frontal corona radiata, suspicious for an acute ischemic  infarct. No associated  hemorrhage or mass effect.  2. Underlying minor chronic microvascular ischemic disease with tiny  remote right occipital and left cerebellar infarcts.     MRA HEAD IMPRESSION:     1. Acute occlusion of a proximal right M2 branch, superior division.  2. Additional moderate proximal right M2 stenosis,  inferior  division.  3. Severe distal right P2 stenosis, with additional moderate left P2  stenosis.  4. Focal severe right A1 stenosis.      SLP Plan  Continue with current plan of care      Recommendations for follow up therapy are one component of a multi-disciplinary discharge planning process, led by the attending physician.  Recommendations may be updated based on patient status, additional functional criteria and insurance authorization.    Recommendations  Diet recommendations: Dysphagia 1 (puree);Nectar-thick liquid Liquids provided via: Teaspoon;Cup (monitor) Medication Administration: Crushed with puree Supervision: Staff to assist with self feeding;Full supervision/cueing for compensatory strategies;Patient able to self feed Compensations: Minimize environmental distractions;Slow rate;Small sips/bites;Lingual sweep for clearance of pocketing;Monitor for anterior loss;Multiple dry swallows after each bite/sip;Follow solids with liquid;Clear throat intermittently (Head tilt/turn Right) Postural Changes and/or Swallow Maneuvers: Out of bed for meals;Seated upright 90 degrees;Upright 30-60 min after meal                 (Palliative Care; Dietician) Oral care BID;Oral care before and after PO;Staff/trained caregiver to provide oral care   Frequent or constant Supervision/Assistance Dysphagia, oropharyngeal phase (R13.12);Dysarthria and anarthria (R47.1)     Continue with current plan of care       Jerilynn Som, MS, CCC-SLP Speech Language Pathologist Rehab Services; [facility] [place] [phone] (ascom) Lauranne Beyersdorf  07/14/2023, 5:44 PM

## 2023-07-14 NOTE — Progress Notes (Signed)
PT Cancellation Note  Patient Details Name: Marissa Gilmore MRN: [id number] DOB: 09/18/1931   Cancelled Treatment:    Reason Eval/Treat Not Completed: Patient declined, no reason specified. Patient states "not now". Will re-attempt later today.    Kerrie Latour 07/14/2023, 10:21 AM

## 2023-07-14 NOTE — Progress Notes (Signed)
Physical Therapy Treatment Patient Details Name: Marissa Gilmore MRN: [id number] DOB: 08-Feb-1931 Today's Date: 07/14/2023   History of Present Illness Marissa Gilmore is a 89 y.o. female with medical history significant for Chronic atrial fibrillation on Eliquis and amiodarone, systolic CHF, HTN and HLD who presented to the ED initially with a 2-day history of persistent vaginal bleeding including passage of golf ball size clots.   While in the emergency room, patient developed atrial fibrillation with RVR, then subsequently she developed left-sided facial droop and slurred speech.  MRI brain showed subtle 1.5 cm focus of diffusion signal abnormality involving the right frontal corona radiata, suspicious for an acute ischemic infarct. Patient with L side weakness and aphasia.    PT Comments  Patient received in supine, with encouragement she is agreeable to PT/OT session. Patient requires max a +2 for bed mobility and positioning at edge of bed. Once seated she requires mod A to maintain sitting balance progressing to cga with cues for upright posture.  She is able to perform seated tasks at edge of bed with cga/min A, limited by fatigue. She will continue to benefit from skilled PT to improve independence.      If plan is discharge home, recommend the following: Help with stairs or ramp for entrance;Assistance with feeding;Assistance with cooking/housework;Two people to help with walking and/or transfers;Two people to help with bathing/dressing/bathroom   Can travel by private vehicle     No  Equipment Recommendations  None recommended by PT (TBD)    Recommendations for Other Services       Precautions / Restrictions Precautions Precautions: Fall Precaution Comments: Low Hgb from vaginal bleeding, L Hemi Restrictions Weight Bearing Restrictions: No     Mobility  Bed Mobility Overal bed mobility: Needs Assistance Bed Mobility: Supine to Sit, Sit to Supine     Supine to sit:  Max assist, +2 for physical assistance, HOB elevated Sit to supine: Max assist, +2 for safety/equipment   General bed mobility comments: Pt required max-total A x2 for bed mobility to get EOB and return to supine    Transfers                   General transfer comment: Declines attempt at standng this session    Ambulation/Gait               General Gait Details: unable to ambulate   Stairs             Wheelchair Mobility     Tilt Bed    Modified Rankin (Stroke Patients Only)       Balance Overall balance assessment: Needs assistance Sitting-balance support: Single extremity supported, Feet supported Sitting balance-Leahy Scale: Poor Sitting balance - Comments: L trunk lean in sitting edge of bed. Initially requiring mod A for midline balance progressing to cga. She is able to self correct with cues, but unable to maintain midline for long due to fatigue. Postural control: Left lateral lean                                  Cognition Arousal: Alert Behavior During Therapy: WFL for tasks assessed/performed Overall Cognitive Status: Difficult to assess                                 General Comments: Pt demonstrates expressive aphasia, although speech  production improved this session        Exercises Other Exercises Other Exercises: LAQ, marching while seated edge of bed. Postural control/weight shifting in sitting    General Comments        Pertinent Vitals/Pain Pain Assessment Pain Assessment: No/denies pain    Home Living                          Prior Function            PT Goals (current goals can now be found in the care plan section) Acute Rehab PT Goals Patient Stated Goal: to return to independence PT Goal Formulation: With patient/family Time For Goal Achievement: 07/24/23 Potential to Achieve Goals: Fair Progress towards PT goals: Progressing toward goals    Frequency    Min  1X/week      PT Plan      Co-evaluation              AM-PAC PT "6 Clicks" Mobility   Outcome Measure  Help needed turning from your back to your side while in a flat bed without using bedrails?: A Lot Help needed moving from lying on your back to sitting on the side of a flat bed without using bedrails?: A Lot Help needed moving to and from a bed to a chair (including a wheelchair)?: Total Help needed standing up from a chair using your arms (e.g., wheelchair or bedside chair)?: Total Help needed to walk in hospital room?: Total Help needed climbing 3-5 steps with a railing? : Total 6 Click Score: 8    End of Session   Activity Tolerance: Patient tolerated treatment well Patient left: in bed;with call bell/phone within reach;with bed alarm set;with family/visitor present Nurse Communication: Mobility status PT Visit Diagnosis: Hemiplegia and hemiparesis;Muscle weakness (generalized) (M62.81);Difficulty in walking, not elsewhere classified (R26.2);Other abnormalities of gait and mobility (R26.89) Hemiplegia - Right/Left: Left Hemiplegia - dominant/non-dominant: Non-dominant Hemiplegia - caused by: Cerebral infarction     Time: 5284-1324 PT Time Calculation (min) (ACUTE ONLY): 40 min  Charges:    $Therapeutic Activity: 8-22 mins PT General Charges $$ ACUTE PT VISIT: 1 Visit                     Adryan Druckenmiller, PT, GCS 07/14/23,2:27 PM

## 2023-07-14 NOTE — Progress Notes (Addendum)
Progress Note   Patient: Marissa Gilmore ZOX:[id number] DOB: 01/29/1931 DOA: 07/08/2023     5 DOS: the patient was seen and examined on 07/14/2023   Brief hospital course: Marissa Gilmore is a 89 y.o. female with medical history significant for Chronic atrial fibrillation on Eliquis and amiodarone, systolic CHF, HTN and HLD who presented to the ED initially with a 2-day history of persistent vaginal bleeding including passage of golf ball size clots.  While in the emergency room, patient developed atrial fibrillation with RVR, then subsequently she developed left-sided facial droop and slurred speech. MRI brain showed subtle 1.5 cm focus of diffusion signal abnormality involving the right frontal corona radiata, suspicious for an acute ischemic infarct. MRA head: 1. Acute occlusion of a proximal right M2 branch, superior division. 2. Additional moderate proximal right M2 stenosis, inferior division. 3. Severe distal right P2 stenosis, with additional moderate left P2 stenosis. 4. Focal severe right A1 stenosis. Patient is seen by neurology, due to large amount of vaginal bleeding, anticoagulation was not able to start. Patient continued to have vaginal bleeding while in the hospital, not able to start anticoagulation. D&C was performed on 9/7 for vaginal bleeding.   Principal Problem:   Acute CVA (cerebrovascular accident) (HCC) Active Problems:   ABLA (acute blood loss anemia)   Hypotension   AKI (acute kidney injury) (HCC)   Chronic systolic (congestive) heart failure (HCC)   Chronic anticoagulation   Post-menopausal bleeding   Atrial fibrillation with rapid ventricular response (HCC)   Acute arterial ischemic stroke, multifocal, anterior circulation, right (HCC)   B12 deficiency anemia   Malnutrition of moderate degree   Hypernatremia   Assessment and Plan: * Acute CVA (cerebrovascular accident) (HCC) Dysphagia secondary to CVA. Patient had a single stroke in the right  ACA distribution.  Given multiple intracranial arterial obstructions, this most likely ischemic stroke.  However, due to large vaginal bleeding, not able to start antiplatelet antiplatelets or Eliquis. Continue statin. Patient still has significant dysphagia, limited p.o. intake.  But still refused any feeding tube.     ABLA (acute blood loss anemia) Postmenopausal bleeding B12 deficient anemia. Initially placed on progesterone 200 mg daily as recommended by GYN after discussion with OB/GYN.  But bleeding does not seem to be improving. Have reached out to OB/GYN again 9/5, per Dr. Jean Rosenthal, tswitching current progesterone to Provera at 20 mg 3 times a day.  Patient had receiving high dose of B12, 2 doses of IV iron.  He went to [facility] on 9/7.  So far patient has received 4 units of PRBC, 2 doses of IV iron.  Hemoglobin relatively stable, but still has significant vaginal bleeding.  Discussed with OB/GYN, they could not offer any other treatment.  She is not a good candidate for hysterectomy due to multiple medical problems.  Pathology from [facility] is pending. I will give another dose of IV iron given large amount of vaginal bleeding. Continue high-dose B12 for B12 deficiency.   AKI (acute kidney injury) on chronic kidney disease stage IIIa.   Mild metabolic acidosis. Hypernatremia. Renal function is worse than baseline, developed hypernatremia due to poor p.o. intake.  Metabolic acidosis has resolved.  Will start D5 water at 50 mL/h.   Atrial fibrillation with rapid ventricular response (HCC) Hypotension. Not able to start anticoagulation due to large vaginal bleeding. Blood pressure seem to be better after initial fluids.   Patient also received a 0.5 mg of digoxin on 9/4 diltiazem 30 mg every 8 hours  for RVR. Heart rate is better controlled today.   Chronic systolic (congestive) heart failure (HCC) EF 30% 2021 Clinically euvolemic Holding torsemide, spironolactone, Entresto for relative low  blood pressure.  Metoprolol restarted for HR control.   Nonsustained VT. Patient had 5 beats of VT 9/5, no recurrence.        Subjective:  Still has significant vaginal bleeding with blood clots. She still has significant dysphagia, poor appetite.  Physical Exam: Vitals:   07/13/23 2356 07/14/23 0500 07/14/23 0855 07/14/23 1212  BP: (!) 150/50 (!) 124/49 133/67 (!) 107/40  Pulse: 75 74 (!) 55 74  Resp: 16 18 18 18   Temp: 98 F (36.7 C) 98 F (36.7 C) 97.9 F (36.6 C) 98.2 F (36.8 C)  TempSrc: Oral Oral    SpO2: 98% 97% 98% 100%  Weight:      Height:       General exam: Frail Respiratory system: Clear to auscultation. Respiratory effort normal. Cardiovascular system: Irregular. No JVD, murmurs, rubs, gallops or clicks. No pedal edema. Gastrointestinal system: Abdomen is nondistended, soft and nontender. No organomegaly or masses felt. Normal bowel sounds heard. Central nervous system: Alert and oriented.  Aphasic with left-sided weakness. Extremities: Symmetric 5 x 5 power. Skin: No rashes, lesions or ulcers Psychiatry: Judgement and insight appear normal. Mood & affect appropriate.    Data Reviewed:  Lab results reviewed.  Family Communication: None  Disposition: Status is: Inpatient Remains inpatient appropriate because: Severity of disease.     Time spent: 35 minutes  Author: Marrion Coy, MD 07/14/2023 12:26 PM  For on call review [email or web address].

## 2023-07-15 ENCOUNTER — Inpatient Hospital Stay: Payer: Medicare Other

## 2023-07-15 DIAGNOSIS — I4891 Unspecified atrial fibrillation: Secondary | ICD-10-CM | POA: Diagnosis not present

## 2023-07-15 DIAGNOSIS — N950 Postmenopausal bleeding: Secondary | ICD-10-CM | POA: Diagnosis not present

## 2023-07-15 DIAGNOSIS — I639 Cerebral infarction, unspecified: Secondary | ICD-10-CM | POA: Diagnosis not present

## 2023-07-15 DIAGNOSIS — D62 Acute posthemorrhagic anemia: Secondary | ICD-10-CM | POA: Diagnosis not present

## 2023-07-15 DIAGNOSIS — N939 Abnormal uterine and vaginal bleeding, unspecified: Secondary | ICD-10-CM | POA: Diagnosis not present

## 2023-07-15 LAB — BASIC METABOLIC PANEL
Anion gap: 3 — ABNORMAL LOW (ref 5–15)
BUN: 35 mg/dL — ABNORMAL HIGH (ref 8–23)
CO2: 22 mmol/L (ref 22–32)
Calcium: 9.1 mg/dL (ref 8.9–10.3)
Chloride: 115 mmol/L — ABNORMAL HIGH (ref 98–111)
Creatinine, Ser: 1.41 mg/dL — ABNORMAL HIGH (ref 0.44–1.00)
GFR, Estimated: 35 mL/min — ABNORMAL LOW (ref >60)
Glucose, Bld: 134 mg/dL — ABNORMAL HIGH (ref 70–99)
Potassium: 4.2 mmol/L (ref 3.5–5.1)
Sodium: 140 mmol/L (ref 135–145)

## 2023-07-15 LAB — MAGNESIUM: Magnesium: 2.8 mg/dL — ABNORMAL HIGH (ref 1.7–2.4)

## 2023-07-15 LAB — PREPARE RBC (CROSSMATCH)

## 2023-07-15 LAB — HEMOGLOBIN
Hemoglobin: 6.9 g/dL — ABNORMAL LOW (ref 12.0–15.0)
Hemoglobin: 8.9 g/dL — ABNORMAL LOW (ref 12.0–15.0)

## 2023-07-15 LAB — PHOSPHORUS: Phosphorus: 2.4 mg/dL — ABNORMAL LOW (ref 2.5–4.6)

## 2023-07-15 MED ORDER — POTASSIUM & SODIUM PHOSPHATES 280-160-250 MG PO PACK
1.0000 | PACK | Freq: Three times a day (TID) | ORAL | Status: AC
  Administered 2023-07-15 (×3): 1 via ORAL
  Filled 2023-07-15 (×3): qty 1

## 2023-07-15 MED ORDER — SODIUM CHLORIDE 0.9% IV SOLUTION: Freq: Once | INTRAVENOUS | Status: AC

## 2023-07-15 MED ORDER — IOHEXOL 350 MG/ML SOLN
75.0000 mL | Freq: Once | INTRAVENOUS | Status: AC | PRN
  Administered 2023-07-15: 75 mL via INTRAVENOUS

## 2023-07-15 MED ORDER — HEPARIN (PORCINE) 25000 UT/250ML-% IV SOLN
950.0000 [IU]/h | INTRAVENOUS | Status: DC
  Administered 2023-07-15: 1000 [IU]/h via INTRAVENOUS
  Filled 2023-07-15: qty 250

## 2023-07-15 NOTE — Consult Note (Signed)
ANTICOAGULATION CONSULT NOTE - Initial Consult  Pharmacy Consult for Heparin infusion Indication: pulmonary embolus and DVT  No Known Allergies  Patient Measurements: Height: 5\' 4"  (162.6 cm) Weight: 61.4 kg (135 lb 5.8 oz) IBW/kg (Calculated) : 54.7 Heparin Dosing Weight: 61.4 kg  Vital Signs: Temp: 98.1 F (36.7 C) (09/10 2001) Temp Source: Oral (09/10 1447) BP: 131/50 (09/10 2001) Pulse Rate: 75 (09/10 2001)  Labs: Recent Labs    07/13/23 0438 07/13/23 1703 07/14/23 0816 07/14/23 1826 07/15/23 0441 07/15/23 1650  HGB 7.2*   < >  --    < > 6.9* 8.9*  CREATININE 1.76*  --  1.63*  --  1.41*  --    < > = values in this interval not displayed.    Estimated Creatinine Clearance: 22.9 mL/min (A) (by C-G formula based on SCr of 1.41 mg/dL (H)).   Medical History: Past Medical History:  Diagnosis Date   Atrial fibrillation (HCC)    Cataract    Diastolic heart failure (HCC)    35% EF   Hypertension     Medications:  PTA: apixaban - last dose 9/3 - HELD IP: Kcentra 50 units/kg given 9/4 at 0057  Assessment: 89 y.o. female with medical history significant for Chronic atrial fibrillation on Eliquis initially presented to ED 07/08/23 with 2-day history of persistent vaginal bleeding including passage of golf ball size clots. Given K-centra for apixaban reversal. S/p D&C 07/12/23  as apixaban continued to to held during admission for vaginal bleeding/low hemoglobin requiring multiple blood transfusions 07/15/23: CT: Acute pulmonary embolus; Focal deep venous thrombus within the left common femoral vein.  Pharmacy has been consulted to initiate and manage IV heparin therapy.    Goal of Therapy:  Heparin level 0.3-0.7 units/ml Monitor platelets by anticoagulation protocol: Yes   Plan:  Start heparin infusion at 1000 units/hr Check anti-Xa level in 8 hours and daily while on heparin Follow hemoglobin closely  Continue to monitor H&H and platelets  Sharen Hones, PharmD,  BCPS Clinical Pharmacist   07/15/2023,9:15 PM

## 2023-07-15 NOTE — TOC Progression Note (Signed)
Transition of Care [facility]) - Progression Note    Patient Details  Name: Marissa Gilmore MRN: [id number] Date of Birth: November 01, 1931  [facility] CM/SW Contact  Allena Katz, LCSW Phone Number: 07/15/2023, 12:31 PM  Clinical Narrative:   MD reports pt still not medically ready. CSW waiting on [facility] to get back with me on an answer on if patient can come there or not.     Expected Discharge Plan: Skilled Nursing Facility    Expected Discharge Plan and Services       Living arrangements for the past 2 months: Single Family Home                                       Social Determinants of Health (SDOH) Interventions SDOH Screenings   Food Insecurity: No Food Insecurity (07/09/2023)  Housing: Low Risk  (07/09/2023)  Transportation Needs: No Transportation Needs (07/09/2023)  Utilities: Not At Risk (07/09/2023)  Alcohol Screen: Low Risk  (09/16/2022)  Depression (PHQ2-9): Low Risk  (09/16/2022)  Financial Resource Strain: Low Risk  (09/16/2022)  Physical Activity: Insufficiently Active (09/16/2022)  Social Connections: Moderately Isolated (09/16/2022)  Stress: No Stress Concern Present (09/16/2022)  Tobacco Use: Low Risk  (07/09/2023)    Readmission Risk Interventions     No data to display

## 2023-07-15 NOTE — Progress Notes (Signed)
Nutrition Follow-up  DOCUMENTATION CODES:   Non-severe (moderate) malnutrition in context of chronic illness  INTERVENTION:   -Continue Nepro Shake po TID, each supplement provides 425 kcal and 19 grams protein  -Continue Magic cup TID with meals, each supplement provides 290 kcal and 9 grams of protein  -Continue MVI with minerals daily -Continue feeding assistance with meals -RD will follow for goals of care and make further recommendations as applicable  NUTRITION DIAGNOSIS:   Moderate Malnutrition related to chronic illness (CHF) as evidenced by mild fat depletion, moderate fat depletion, mild muscle depletion, moderate muscle depletion.  Ongoing  GOAL:   Patient will meet greater than or equal to 90% of their needs  Progressing   MONITOR:   PO intake, Supplement acceptance  REASON FOR ASSESSMENT:   Consult Assessment of nutrition requirement/status, Poor PO  ASSESSMENT:   Pt with medical history significant for Chronic atrial fibrillation on Eliquis and amiodarone, systolic CHF, HTN and HLD who presented with a 2-day PTA history of persistent vaginal bleeding including passage of golf ball size clots.  9/4- s/p BSE- dysphagia 2 diet with thin liquids 9/5- s/p BSE- downgraded to dysphagia 1 diet with nectar thick liquids 9/7- s/p Procedure(s): DILATATION AND CURETTAGE (N/A)  Reviewed I/O's: +12 ml x 24 hours and +2.3 L since admission  UOP: 529 ml x 24 hours   Pt sitting up in bed at time of visit. Breakfast tray in front of her. Pt took only bites of meal. She requested that RD assist her with breakfast. Pt consumed a bites of eggs and two sips of thickened juice; she reports she did not want any more. Noted food spillage on lt side of mouth. RD wiped pt face and chest down to clean food debris. Case discussed with SLP, who also reports a lot of food spillage. Noted meal completions 0%.   Palliative care following for goals of care discussions. Pt indicated that  does not want feeding tube. If this still remains within pt's goals of care, there is nothing further that RD can offer at this time.   Medications reviewed and include vitamin B12.   Labs reviewed: Phos: 2.4 (on PO supplementation), Mg: 2.8, CBGS: 121 (inpatient orders for glycemic control are none).    Diet Order:   Diet Order             DIET - DYS 1 Room service appropriate? Yes; Fluid consistency: Nectar Thick  Diet effective now                   EDUCATION NEEDS:   Education needs have been addressed  Skin:  Skin Assessment: Skin Integrity Issues: Skin Integrity Issues:: Incisions Incisions: closed perineum  Last BM:  Unknown  Height:   Ht Readings from Last 1 Encounters:  07/08/23 5\' 4"  (1.626 m)    Weight:   Wt Readings from Last 1 Encounters:  07/15/23 61.4 kg    Ideal Body Weight:  54.5 kg  BMI:  Body mass index is 23.23 kg/m.  Estimated Nutritional Needs:   Kcal:  1650-1850  Protein:  85-100 grams  Fluid:  > 1.6 L    Levada Schilling, RD, LDN, CDCES Registered Dietitian II Certified Diabetes Care and Education Specialist Please refer to [facility] for RD and/or RD on-call/weekend/after hours pager

## 2023-07-15 NOTE — Progress Notes (Addendum)
Progress Note   Patient: Marissa Gilmore YQM:[id number] DOB: [birth year] DOA: 07/08/2023     6 DOS: the patient was seen and examined on 07/15/2023   Brief hospital course: [name] is a 89 y.o. female with medical history significant for Chronic atrial fibrillation on Eliquis and amiodarone, systolic CHF, HTN and HLD who presented to the ED initially with a 2-day history of persistent vaginal bleeding including passage of golf ball size clots.  While in the emergency room, patient developed atrial fibrillation with RVR, then subsequently she developed left-sided facial droop and slurred speech. MRI brain showed subtle 1.5 cm focus of diffusion signal abnormality involving the right frontal corona radiata, suspicious for an acute ischemic infarct. MRA head: 1. Acute occlusion of a proximal right M2 branch, superior division. 2. Additional moderate proximal right M2 stenosis, inferior division. 3. Severe distal right P2 stenosis, with additional moderate left P2 stenosis. 4. Focal severe right A1 stenosis. Patient is seen by neurology, due to large amount of vaginal bleeding, anticoagulation was not able to start. Patient continued to have vaginal bleeding while in the hospital, not able to start anticoagulation. D&C was performed on 9/7 for vaginal bleeding.   Principal Problem:   Acute CVA (cerebrovascular accident) (HCC) Active Problems:   ABLA (acute blood loss anemia)   Hypotension   AKI (acute kidney injury) (HCC)   Chronic systolic (congestive) heart failure (HCC)   Chronic anticoagulation   Post-menopausal bleeding   Atrial fibrillation with rapid ventricular response (HCC)   Acute arterial ischemic stroke, multifocal, anterior circulation, right (HCC)   B12 deficiency anemia   Malnutrition of moderate degree   Hypernatremia   Assessment and Plan: * Acute CVA (cerebrovascular accident) (HCC) Dysphagia secondary to CVA. Patient had a single stroke in the right  ACA distribution.  Given multiple intracranial arterial obstructions, this most likely ischemic stroke.  However, due to large vaginal bleeding, not able to start antiplatelet antiplatelets or Eliquis. Continue statin. Patient still has significant dysphagia, also poor appetite, limited p.o. intake.  Discussed with her again, still refusing tube feeding.     ABLA (acute blood loss anemia) Postmenopausal bleeding B12 deficient anemia. Initially placed on progesterone 200 mg daily as recommended by GYN after discussion with OB/GYN.  But bleeding does not seem to be improving. Have reached out to OB/GYN again 9/5, per Dr. Jean Rosenthal, tswitching current progesterone to Provera at 20 mg 3 times a day.  Patient had receiving high dose of B12, 2 doses of IV iron.  He went to [facility] on 9/7.  Patient had received 4 units of PRBC, 3 doses of IV iron. Continue high-dose B12 for B12 deficiency.  Pathology from [facility] is pending. Patient still has heavy vaginal bleeding, hemoglobin dropped again to 6.9, transfuse another PRBC.  I have reached out to OB/GYN again, they will see patient and talk to the family.  They do not believe she is good candidate for hysterectomy.  Addendum: 5284.  Discussed with Dr. Feliberto Gottron, he has discussed with IR, for possible uterus thrombolization.  Will need a CT angiogram of the pelvis and abdomen.  Patient had a stage III chronic kidney disease, however, due to the dire situation, patient will need a procedure.  Family and patient is agreeable.   AKI (acute kidney injury) on chronic kidney disease stage IIIa.   Mild metabolic acidosis. Hypernatremia. Renal function is worse than baseline, developed hypernatremia due to poor p.o. intake.  Metabolic acidosis has resolved.  Started on D5 water  at a lower dose of 50 mL/h. Sodium level is better, I will continue D5 water for another day.   Atrial fibrillation with rapid ventricular response (HCC) Hypotension. Not able to start  anticoagulation due to large vaginal bleeding. Blood pressure seem to be better after initial fluids.   Patient also received a 0.5 mg of digoxin on 9/4 diltiazem 30 mg every 8 hours for RVR. Heart rate is better controlled.   Chronic systolic (congestive) heart failure (HCC) EF 30% 2021 Clinically euvolemic Holding torsemide, spironolactone, Entresto for relative low blood pressure.  Metoprolol restarted for HR control.   Nonsustained VT. Patient had 5 beats of VT 9/5, no recurrence.   Prognosis. Patient has already advanced age, had a major stroke, dysphagia, refusing tube feeding.  Has vaginal bleeding that could not stop after D&C.  Not sure OB/GYN has any option.  Patient was seen by palliative care, may consider comfort care if condition does not improve in next 2 or 3 days.    Subjective:  Patient still has significant vaginal bleeding, still has a poor appetite, still refusing tube feeding.  Physical Exam: Vitals:   07/15/23 0843 07/15/23 0900 07/15/23 1145 07/15/23 1209  BP:  112/60 (!) 120/51 (!) 111/44  Pulse:   63 84  Resp:   16 16  Temp:   97.8 F (36.6 C) 97.6 F (36.4 C)  TempSrc:      SpO2:   100% 100%  Weight: 61.4 kg     Height:       General exam: Frail, Respiratory system: Clear to auscultation. Respiratory effort normal. Cardiovascular system: S1 & S2 heard, RRR. No JVD, murmurs, rubs, gallops or clicks. No pedal edema. Gastrointestinal system: Abdomen is nondistended, soft and nontender. No organomegaly or masses felt. Normal bowel sounds heard. Central nervous system: Alert and oriented x3.  Aphasia with left side weakness. Extremities: Symmetric 5 x 5 power. Skin: No rashes, lesions or ulcers Psychiatry:  Mood & affect appropriate.    Data Reviewed:  Lab results reviewed.  Family Communication: Niece updated at bedside.  Disposition: Status is: Inpatient Remains inpatient appropriate because: Severity of disease, IV treatment.     Time  spent: 50 minutes  Author: Marrion Coy, MD 07/15/2023 12:25 PM  For on call review [email or web address].

## 2023-07-15 NOTE — Progress Notes (Signed)
Palliative Care Progress Note, Assessment & Plan   Patient Name: Marissa Gilmore       Date: 07/15/2023 DOB: Sep 07, 1931  Age: 89 y.o. MRN#: [id number] Attending Physician: Marrion Coy, MD Primary Care Physician: Erasmo Downer, MD Admit Date: 07/08/2023  Subjective: Patient is lying in bed in no apparent distress.  She is sleeping but is easily awakened.  She is alert and oriented x 4.  Her niece Okey Regal is at bedside during her visit.  HPI: Marissa Gilmore is a 89 y.o. female with medical history significant for Chronic atrial fibrillation on Eliquis and amiodarone, systolic CHF, HTN and HLD who presented to the ED initially with a 2-day history of persistent vaginal bleeding including passage of golf ball size clots.  While in the emergency room, patient developed atrial fibrillation with RVR, then subsequently she developed left-sided facial droop and slurred speech. MRI brain showed subtle 1.5 cm focus of diffusion signal abnormality involving the right frontal corona radiata, suspicious for an acute ischemic infarct. MRA head: 1. Acute occlusion of a proximal right M2 branch, superior division. 2. Additional moderate proximal right M2 stenosis, inferior division. 3. Severe distal right P2 stenosis, with additional moderate left P2 stenosis. 4. Focal severe right A1 stenosis. Patient is seen by neurology, due to large amount of vaginal bleeding, anticoagulation was not able to start. Patient continued to have vaginal bleeding while in the hospital, not able to start anticoagulation. D&C was performed on 9/7 for vaginal bleeding.  Summary of counseling/coordination of care: After reviewing the patient's chart and assessing the patient at bedside, I spoke with patient in regards to symptom  management and boundaries of care.  Symptoms assessed.  Patient endorses she is feeling tired.  She endorses she has not been able to get much rest with somebody people coming in and out provide medical treatment throughout the night.  She shares she is otherwise feeling fine.  She has no acute complaints today.  No adjustments to more needed.  I spoke with patient and niece in regards to plan of care.  Education provided on blood loss with replacement of RBCs in addition to [facility] and GYN recommendations for possible hysterectomy.   I shared concern of the cyclical affective patient continued to have blood loss due to suspected endometrial cancer and replacing it with RBCs.  This pattern is not sustainable long-term.  Discussed remaining hopeful for improvement and also realistic that patient's functional and nutritional status continues to decline during this hospitalization.  Patient endorses she has had her feet on the ground and was mobile 1 time.  She remains hopeful that she can continue to work with PT and be more mobile.  Discussed thickened liquid diet.  Patient is interested in adding thickener to coconut water as this sounds pleasing to her appetite.  Discussed with SLP and RN and advised that thicken up powder be sent to room for patient to use.  Discussed importance of nutritional support for healing and energy.  Patient remains adamant that she would not be accepting of artificial nutrition via PEG tube/feeding tube.  DNR with limited interventions remains.  Therapeutic silence, active listening, and emotional support provided to patient and niece  at bedside.  PMT will continue to follow and support patient and family throughout her hospitalization.  Physical Exam Vitals reviewed.  Constitutional:      General: She is not in acute distress.    Appearance: She is normal weight.  HENT:     Mouth/Throat:     Mouth: Mucous membranes are moist.     Comments: Left sided facial  droop Eyes:     Pupils: Pupils are equal, round, and reactive to light.  Cardiovascular:     Rate and Rhythm: Normal rate.     Pulses: Normal pulses.  Pulmonary:     Effort: Pulmonary effort is normal.  Abdominal:     Palpations: Abdomen is soft.  Skin:    Coloration: Skin is pale.  Neurological:     Mental Status: She is alert and oriented to person, place, and time.  Psychiatric:        Mood and Affect: Mood normal.        Behavior: Behavior normal.             Total Time 35 minutes   [name], DNP, FNP-BC Palliative Medicine Team

## 2023-07-15 NOTE — Progress Notes (Signed)
PT Cancellation Note  Patient Details Name: Marissa Gilmore MRN: [id number] DOB: 10-25-1931   Cancelled Treatment:    Reason Eval/Treat Not Completed: Patient declined, no reason specified. Patient fatigued. She and friend report she has not slept much. Declines PT at this time. Will re-attempt at later date/time.    Yarel Rushlow 07/15/2023, 3:35 PM

## 2023-07-15 NOTE — Progress Notes (Signed)
Obstetric and Gynecology  Subjective  Marissa Gilmore is a 89 y.o. female G0P0000 who presented on 07/08/2023 for vaginal bleeding and suffered a subsequent CVA during admission . I performed a D+C on 07/12/23 (Path negative for atypia / cancer). Endometrial ablation couldnot be completed due to a tortuous endometrial cavity . Pt has been on Provera since admission and continues to bleed requiring now her 4th unit of blood . Nursing states that she is passing clots , but not measuring quantitative blood loss. Hemoglobin this am 6.9. Cr 1.41 Known fibroid utx with several fibroid with the largest 7.8cm      Objective   Vitals:   07/15/23 1209 07/15/23 1403  BP: (!) 111/44 (!) 120/53  Pulse: 84 (!) 51  Resp: 16   Temp: 97.6 F (36.4 C)   SpO2: 100% 98%     Intake/Output Summary (Last 24 hours) at 07/15/2023 1425 Last data filed at 07/15/2023 8119 Gross per 24 hour  Intake 540.7 ml  Output 529 ml  Net 11.7 ml    General: NAD Cardiovascular: RRR, no murmurs Pulmonary: CTAB Abdomen: Benign. Non-tender, +BS, no guarding. Extremities: No erythema or cords, no calf tenderness, +warmth with normal peripheral pulses.  Labs: Results for orders placed or performed during the hospital encounter of 07/08/23 (from the past 24 hour(s))  Hemoglobin     Status: Abnormal   Collection Time: 07/14/23  6:26 PM  Result Value Ref Range   Hemoglobin 8.0 (L) 12.0 - 15.0 g/dL  Hemoglobin     Status: Abnormal   Collection Time: 07/15/23  4:41 AM  Result Value Ref Range   Hemoglobin 6.9 (L) 12.0 - 15.0 g/dL  Basic metabolic panel     Status: Abnormal   Collection Time: 07/15/23  4:41 AM  Result Value Ref Range   Sodium 140 135 - 145 mmol/L   Potassium 4.2 3.5 - 5.1 mmol/L   Chloride 115 (H) 98 - 111 mmol/L   CO2 22 22 - 32 mmol/L   Glucose, Bld 134 (H) 70 - 99 mg/dL   BUN 35 (H) 8 - 23 mg/dL   Creatinine, Ser 1.47 (H) 0.44 - 1.00 mg/dL   Calcium 9.1 8.9 - 82.9 mg/dL   GFR, Estimated 35 (L)  >60 mL/min   Anion gap 3 (L) 5 - 15  Magnesium     Status: Abnormal   Collection Time: 07/15/23  4:41 AM  Result Value Ref Range   Magnesium 2.8 (H) 1.7 - 2.4 mg/dL  Phosphorus     Status: Abnormal   Collection Time: 07/15/23  4:41 AM  Result Value Ref Range   Phosphorus 2.4 (L) 2.5 - 4.6 mg/dL  Prepare RBC (crossmatch)     Status: None   Collection Time: 07/15/23  8:00 AM  Result Value Ref Range   Order Confirmation      ORDER PROCESSED BY BLOOD BANK Performed at [facility], [address] [place], [place] [address]     Cultures: No results found for this or any previous visit.  Imaging: US Carotid Bilateral  Result Date: 07/12/2023 CLINICAL DATA:  Stroke.  History of hypertension and hyperlipidemia. EXAM: BILATERAL CAROTID DUPLEX ULTRASOUND TECHNIQUE: Wallace Cullens scale imaging, color Doppler and duplex ultrasound were performed of bilateral carotid and vertebral arteries in the neck. COMPARISON:  None Available. FINDINGS: Criteria: Quantification of carotid stenosis is based on velocity parameters that correlate the residual internal carotid diameter with NASCET-based stenosis levels, using the diameter of the distal internal carotid lumen  as the denominator for stenosis measurement. The following velocity measurements were obtained: RIGHT ICA: 103/17 cm/sec CCA: 95/8 cm/sec SYSTOLIC ICA/CCA RATIO:  1.1 ECA: 159 cm/sec LEFT ICA: 147/42 cm/sec CCA: 116/14 cm/sec SYSTOLIC ICA/CCA RATIO:  0.3 ECA: 105 cm/sec RIGHT CAROTID ARTERY: There is a moderate amount of eccentric echogenic plaque within the distal aspect of the right common carotid artery (image 11). There is a moderate amount of eccentric echogenic plaque within the right carotid bulb (images 14 and 15), extending to involve the origin and proximal aspects of the right internal carotid artery (image 22), not resulting in elevated peak systolic velocities within the interrogated course of the right internal carotid artery to  suggest a hemodynamically significant stenosis. RIGHT VERTEBRAL ARTERY:  Antegrade flow LEFT CAROTID ARTERY: There is a moderate amount of eccentric echogenic partially shadowing plaque within left carotid bulb (images 45 and 46), extending to involve the origin and proximal aspects of the right internal carotid artery (image 53), which results in elevated peak systolic velocities within the proximal and mid aspects of the left internal carotid artery. Greatest acquired peak systolic velocity within the proximal left ICA measures 147 centimeters/second (image 55). LEFT VERTEBRAL ARTERY:  Antegrade flow IMPRESSION: 1. Moderate amount of left-sided atherosclerotic plaque results in elevated peak systolic velocities of the left internal carotid artery compatible with the 50-69% luminal narrowing range. Further evaluation with CTA could performed as indicated. 2. Moderate amount of right-sided atherosclerotic plaque, not definitively resulting in a hemodynamically significant stenosis. Electronically Signed   By: Simonne Come M.D.   On: 07/12/2023 13:04   ECHOCARDIOGRAM COMPLETE  Result Date: 07/09/2023    ECHOCARDIOGRAM REPORT   Patient Name:   Marissa Gilmore Date of Exam: 07/09/2023 Medical Rec #:  [id number]          Height:       64.0 in Accession #:    [id number]         Weight:       128.0 lb Date of Birth:  12-Jun-1931          BSA:          1.618 m Patient Age:    89 years           BP:           101/62 mmHg Patient Gender: F                  HR:           119 bpm. Exam Location:  [facility] Procedure: 2D Echo, Cardiac Doppler and Color Doppler Indications:     Stroke  History:         Patient has no prior history of Echocardiogram examinations.                  Stroke, Arrythmias:Atrial Fibrillation, Signs/Symptoms:Dyspnea                  and Fatigue; Risk Factors:Hypertension and Dyslipidemia.  Sonographer:     Mikki Harbor Referring Phys:  [id number] Andris Baumann Diagnosing Phys: [name] MD  IMPRESSIONS  1. Left ventricular ejection fraction, by estimation, is 55 to 60%. The left ventricle has normal function. The left ventricle has no regional wall motion abnormalities. Left ventricular diastolic function could not be evaluated.  2. Right ventricular systolic function is normal. The right ventricular size is normal. There is moderately elevated pulmonary artery systolic pressure.  3. Left atrial size was mildly  dilated.  4. The mitral valve is degenerative. Mild to moderate mitral valve regurgitation.  5. Tricuspid valve regurgitation is moderate.  6. The aortic valve is normal in structure. Aortic valve regurgitation is trivial. FINDINGS  Left Ventricle: Left ventricular ejection fraction, by estimation, is 55 to 60%. The left ventricle has normal function. The left ventricle has no regional wall motion abnormalities. The left ventricular internal cavity size was normal in size. There is  no left ventricular hypertrophy. Left ventricular diastolic function could not be evaluated. Right Ventricle: The right ventricular size is normal. No increase in right ventricular wall thickness. Right ventricular systolic function is normal. There is moderately elevated pulmonary artery systolic pressure. The tricuspid regurgitant velocity is 3.32 m/s, and with an assumed right atrial pressure of 8 mmHg, the estimated right ventricular systolic pressure is 52.1 mmHg. Left Atrium: Left atrial size was mildly dilated. Right Atrium: Right atrial size was normal in size. Pericardium: There is no evidence of pericardial effusion. Mitral Valve: Eccentric posterior jet. The mitral valve is degenerative in appearance. Mild to moderate mitral valve regurgitation. MV peak gradient, 4.0 mmHg. The mean mitral valve gradient is 2.0 mmHg. Tricuspid Valve: Eccentric posterior jet. The tricuspid valve is grossly normal. Tricuspid valve regurgitation is moderate. Aortic Valve: The aortic valve is normal in structure. Aortic valve  regurgitation is trivial. Aortic valve mean gradient measures 5.7 mmHg. Aortic valve peak gradient measures 11.9 mmHg. Aortic valve area, by VTI measures 1.57 cm. Pulmonic Valve: The pulmonic valve was normal in structure. Pulmonic valve regurgitation is not visualized. Aorta: The ascending aorta was not well visualized. IAS/Shunts: No atrial level shunt detected by color flow Doppler.  LEFT VENTRICLE PLAX 2D LVIDd:         4.40 cm LVIDs:         3.10 cm LV PW:         1.10 cm LV IVS:        0.80 cm LVOT diam:     2.00 cm LV SV:         50 LV SV Index:   31 LVOT Area:     3.14 cm  RIGHT VENTRICLE RV Basal diam:  3.60 cm RV Mid diam:    2.70 cm RV S prime:     13.40 cm/s LEFT ATRIUM             Index        RIGHT ATRIUM           Index LA diam:        4.30 cm 2.66 cm/m   RA Area:     22.30 cm LA Vol (A2C):   93.7 ml 57.90 ml/m  RA Volume:   64.70 ml  39.98 ml/m LA Vol (A4C):   78.7 ml 48.63 ml/m LA Biplane Vol: 85.8 ml 53.02 ml/m  AORTIC VALVE                     PULMONIC VALVE AV Area (Vmax):    1.64 cm      PV Vmax:       1.32 m/s AV Area (Vmean):   1.44 cm      PV Peak grad:  7.0 mmHg AV Area (VTI):     1.57 cm AV Vmax:           172.67 cm/s AV Vmean:          110.000 cm/s AV VTI:  0.319 m AV Peak Grad:      11.9 mmHg AV Mean Grad:      5.7 mmHg LVOT Vmax:         90.20 cm/s LVOT Vmean:        50.450 cm/s LVOT VTI:          0.159 m LVOT/AV VTI ratio: 0.50  AORTA Ao Root diam: 3.00 cm MITRAL VALVE               TRICUSPID VALVE MV Area (PHT): 5.62 cm    TR Peak grad:   44.1 mmHg MV Area VTI:   2.93 cm    TR Vmax:        332.00 cm/s MV Peak grad:  4.0 mmHg MV Mean grad:  2.0 mmHg    SHUNTS MV Vmax:       1.00 m/s    Systemic VTI:  0.16 m MV Vmean:      58.3 cm/s   Systemic Diam: 2.00 cm MV Decel Time: 135 msec MV E velocity: 91.70 cm/s [name] MD Electronically signed by [name] MD Signature Date/Time: 07/09/2023/1:15:37 PM    Final    MR BRAIN WO CONTRAST  Addendum Date:  07/09/2023   ADDENDUM REPORT: 07/09/2023 03:11 ADDENDUM: In addition to the initially described findings, note is made of a 9 mm T1 hypointense lesion within the C4 vertebral body, indeterminate. This is described in the body of the report, omitted from the impression by mistake. Further evaluation with dedicated MRI of the cervical spine, with and without contrast, suggested for further evaluation as warranted. Electronically Signed   By: Rise Mu M.D.   On: 07/09/2023 03:11   Result Date: 07/09/2023 CLINICAL DATA:  Initial evaluation for neuro deficit, stroke suspected. Left-sided deficit. EXAM: MRI HEAD WITHOUT CONTRAST MRA HEAD WITHOUT CONTRAST TECHNIQUE: Multiplanar, multi-echo pulse sequences of the brain and surrounding structures were acquired without intravenous contrast. Angiographic images of the Circle of Willis were acquired using MRA technique without intravenous contrast. COMPARISON:  Comparison made with prior CT from 07/08/2023. FINDINGS: MRI HEAD FINDINGS Brain: Cerebral volume within normal limits. Minimal hazy FLAIR signal abnormality noted involving the periventricular R[place] matter, likely related to chronic microvascular ischemic disease, minimal in nature and less than is typically seen for age. Tiny remote infarct present at the right occipital lobe (series 20, image 21). Additional tiny remote left cerebellar infarct noted. Subtle focus of diffusion signal abnormality measuring approximately 1.5 cm seen at the right frontal corona radiata (series 10, image 78). Associated subtle signal loss on ADC map (series 11, image 30). Findings suspicious for a possible acute ischemic infarct. No associated hemorrhage or mass effect. No other evidence for acute or subacute ischemia. Gray-Palomo matter differentiation otherwise maintained. No areas of chronic cortical infarction. No significant acute or chronic intracranial blood products. No mass lesion, midline shift or mass effect. No  hydrocephalus or extra-axial fluid collection. Pituitary gland and suprasellar region within normal limits. Vascular: Major intracranial vascular flow voids are maintained. Skull and upper cervical spine: Craniocervical junction within normal limits. 9 mm T1 hypointense lesion noted within the visualized C4 vertebral body (series 14, image 12), indeterminate. Bone marrow signal intensity otherwise normal. No scalp soft tissue abnormality. Sinuses/Orbits: Prior ocular lens replacement on the right. Globes and orbital soft tissues otherwise unremarkable. Paranasal sinuses are largely clear. No significant mastoid effusion. Other: None. MRA HEAD FINDINGS Anterior circulation: Visualized distal cervical segments of the internal carotid arteries are mildly  tortuous but patent with antegrade flow. Petrous, cavernous, and supraclinoid segments patent. Left A1 segment widely patent. Focal severe right A1 stenosis (series 09811, image 8). Normal anterior communicating artery complex. Anterior cerebral arteries patent without stenosis. No M1 stenosis or occlusion. Left MCA branches well perfused. On the right, there is acute occlusion of a proximal right M2 branch, superior division (series 91478, image 13). Inferior division remains patent and perfused, although a probable moderate proximal right M2 stenosis noted (series 29562, image 12). Posterior circulation: Both vertebral arteries are patent to the vertebrobasilar junction without visible stenosis. Right PICA patent. Left PICA not well seen. Basilar patent without stenosis. Superior cerebral arteries patent bilaterally. Both PCAs primarily supplied via the basilar. Focal moderate left P2 stenosis (series 1021, image 2). Left PCA otherwise patent. On the right, there is a severe distal right P2 stenosis (series 1021, image 7). Right PCA is markedly attenuated distally and only faintly visible on 3D time-of-flight sequence. Anatomic variants: None significant.  No  aneurysm. IMPRESSION: MRI HEAD IMPRESSION: 1. Subtle 1.5 cm focus of diffusion signal abnormality involving the right frontal corona radiata, suspicious for an acute ischemic infarct. No associated hemorrhage or mass effect. 2. 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 MRA HEAD IMPRESSION: 1. Acute occlusion of a proximal right M2 branch, superior division. 2. Additional moderate proximal right M2 stenosis, inferior division. 3. Severe distal right P2 stenosis, with additional moderate left P2 stenosis. 4. Focal severe right A1 stenosis. Electronically Signed: By: Rise Mu M.D. On: 07/09/2023 03:05   MR ANGIO HEAD WO CONTRAST  Addendum Date: 07/09/2023   ADDENDUM REPORT: 07/09/2023 03:11 ADDENDUM: In addition to the initially described findings, note is made of a 9 mm T1 hypointense lesion within the C4 vertebral body, indeterminate. This is described in the body of the report, omitted from the impression by mistake. Further evaluation with dedicated MRI of the cervical spine, with and without contrast, suggested for further evaluation as warranted. Electronically Signed   By: Rise Mu M.D.   On: 07/09/2023 03:11   Result Date: 07/09/2023 CLINICAL DATA:  Initial evaluation for neuro deficit, stroke suspected. Left-sided deficit. EXAM: MRI HEAD WITHOUT CONTRAST MRA HEAD WITHOUT CONTRAST TECHNIQUE: Multiplanar, multi-echo pulse sequences of the brain and surrounding structures were acquired without intravenous contrast. Angiographic images of the Circle of Willis were acquired using MRA technique without intravenous contrast. COMPARISON:  Comparison made with prior CT from 07/08/2023. FINDINGS: MRI HEAD FINDINGS Brain: Cerebral volume within normal limits. Minimal hazy FLAIR signal abnormality noted involving the periventricular [place] matter, likely related to chronic microvascular ischemic disease, minimal in  nature and less than is typically seen for age. Tiny remote infarct present at the right occipital lobe (series 20, image 21). Additional tiny remote left cerebellar infarct noted. Subtle focus of diffusion signal abnormality measuring approximately 1.5 cm seen at the right frontal corona radiata (series 10, image 78). Associated subtle signal loss on ADC map (series 11, image 30). Findings suspicious for a possible acute ischemic infarct. No associated hemorrhage or mass effect. No other evidence for acute or subacute ischemia. Gray-Kraeger matter differentiation otherwise maintained. No areas of chronic cortical infarction. No significant acute or chronic intracranial blood products. No mass lesion, midline shift or mass effect. No hydrocephalus or extra-axial fluid collection. Pituitary gland and suprasellar region within normal limits. Vascular: Major intracranial vascular flow voids are maintained. Skull and upper cervical spine: Craniocervical junction within normal limits. 9  mm T1 hypointense lesion noted within the visualized C4 vertebral body (series 14, image 12), indeterminate. Bone marrow signal intensity otherwise normal. No scalp soft tissue abnormality. Sinuses/Orbits: Prior ocular lens replacement on the right. Globes and orbital soft tissues otherwise unremarkable. Paranasal sinuses are largely clear. No significant mastoid effusion. Other: None. MRA HEAD FINDINGS Anterior circulation: Visualized distal cervical segments of the internal carotid arteries are mildly tortuous but patent with antegrade flow. Petrous, cavernous, and supraclinoid segments patent. Left A1 segment widely patent. Focal severe right A1 stenosis (series 40981, image 8). Normal anterior communicating artery complex. Anterior cerebral arteries patent without stenosis. No M1 stenosis or occlusion. Left MCA branches well perfused. On the right, there is acute occlusion of a proximal right M2 branch, superior division (series 19147,  image 13). Inferior division remains patent and perfused, although a probable moderate proximal right M2 stenosis noted (series 82956, image 12). Posterior circulation: Both vertebral arteries are patent to the vertebrobasilar junction without visible stenosis. Right PICA patent. Left PICA not well seen. Basilar patent without stenosis. Superior cerebral arteries patent bilaterally. Both PCAs primarily supplied via the basilar. Focal moderate left P2 stenosis (series 1021, image 2). Left PCA otherwise patent. On the right, there is a severe distal right P2 stenosis (series 1021, image 7). Right PCA is markedly attenuated distally and only faintly visible on 3D time-of-flight sequence. Anatomic variants: None significant.  No aneurysm. IMPRESSION: MRI HEAD IMPRESSION: 1. Subtle 1.5 cm focus of diffusion signal abnormality involving the right frontal corona radiata, suspicious for an acute ischemic infarct. No associated hemorrhage or mass effect. 2. 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 MRA HEAD IMPRESSION: 1. Acute occlusion of a proximal right M2 branch, superior division. 2. Additional moderate proximal right M2 stenosis, inferior division. 3. Severe distal right P2 stenosis, with additional moderate left P2 stenosis. 4. Focal severe right A1 stenosis. Electronically Signed: By: Rise Mu M.D. On: 07/09/2023 03:05   US PELVIC COMPLETE WITH TRANSVAGINAL  Result Date: 07/08/2023 CLINICAL DATA:  Vaginal bleeding in a 89 year old female EXAM: TRANSABDOMINAL ULTRASOUND OF PELVIS TECHNIQUE: Transabdominal ultrasound examination of the pelvis was performed including evaluation of the uterus, ovaries, adnexal regions, and pelvic cul-de-sac. Patient declined transvaginal exam. COMPARISON:  None Available. FINDINGS: Uterus Measurements: 50.3 x 6.6 x 8.6 cm = volume: 452 mL. Enlarged heterogenous uterus with multiple fibroids  measuring up to 7.8 cm. Endometrium Thickness: 24 mm. Thickened heterogenous endometrium without definite focal lesion. Right ovary Not visualized due to enlarged heterogenous uterus and overlying bowel. Left ovary Not visualized due to enlarged heterogenous uterus and overlying. Other findings:  No abnormal free fluid. IMPRESSION: 1. Thickened heterogenous endometrium measuring up to 24 mm. In the setting of post-menopausal bleeding, endometrial sampling is indicated to exclude carcinoma. If results are benign, sonohysterogram should be considered for focal lesion work-up. (Ref: Radiological Reasoning: Algorithmic Workup of Abnormal Vaginal Bleeding with Endovaginal Sonography and Sonohysterography. AJR 2008; 213:Y86-57) 2. Enlarged heterogenous uterus with multiple fibroids. 3. Nonvisualization of the ovaries. Electronically Signed   By: Minerva Fester M.D.   On: 07/08/2023 20:49   CT HEAD CODE STROKE WO CONTRAST  Result Date: 07/08/2023 CLINICAL DATA:  Code stroke.  Neuro deficit, acute, stroke suspected EXAM: CT HEAD WITHOUT CONTRAST TECHNIQUE: Contiguous axial images were obtained from the base of the skull through the vertex without intravenous contrast. RADIATION DOSE REDUCTION: This exam was performed according to the departmental dose-optimization program which includes automated exposure  control, adjustment of the mA and/or kV according to patient size and/or use of iterative reconstruction technique. COMPARISON:  CT head 07/21/2017. FINDINGS: Brain: No evidence of acute infarction, hemorrhage, hydrocephalus, extra-axial collection or mass lesion/mass effect. Vascular: No hyperdense vessel or unexpected calcification. Skull: Normal. Negative for fracture or focal lesion. Sinuses/Orbits: No acute finding. Other: None. ASPECTS [facility] Stroke Program Early CT Score) Total score (0-10 with 10 being normal): 10. IMPRESSION: 1. No evidence of acute intracranial abnormality. 2. ASPECTS is 10. Code stroke  imaging results were communicated on 07/08/2023 at 8:12 pm to provider Chesley Noon via telephone, who verbally acknowledged these results. Electronically Signed   By: Feliberto Harts M.D.   On: 07/08/2023 20:13     Assessment   89 y.o. G0P0000 Hospital Day: 8  Continue vaginal bleeding with anemia due to blood loss requiring blood transfusions   Plan  Spoke to the family and pt about the option of interventional  radiology performing an eval for possible [place]. Spoke with DR Archer Asa  IR Radiologist who recommends a CT arteriogram of the abd and pelvis to look for bleeding vessel. After the test if IR feels she is a candidate then they will talk to her about a [place] If not then possibility of a supracervical TAH can be discussed( already spoke with pt and AUnt today about this today )  Dr Chipper Herb has already discussed the possibility of comfort care with her and family  60 minutes in patient care

## 2023-07-15 NOTE — Progress Notes (Signed)
Per Dr Chipper Herb, dc stroke scale, [facility]

## 2023-07-15 NOTE — Plan of Care (Signed)
  Problem: Education: Goal: Knowledge of disease or condition will improve Outcome: Progressing Goal: Knowledge of secondary prevention will improve (MUST DOCUMENT ALL) Outcome: Progressing Goal: Knowledge of patient specific risk factors will improve (Mark N/A or DELETE if not current risk factor) Outcome: Progressing   Problem: Ischemic Stroke/TIA Tissue Perfusion: Goal: Complications of ischemic stroke/TIA will be minimized Outcome: Progressing   Problem: Coping: Goal: Will verbalize positive feelings about self Outcome: Progressing Goal: Will identify appropriate support needs Outcome: Progressing   Problem: Health Behavior/Discharge Planning: Goal: Ability to manage health-related needs will improve Outcome: Progressing Goal: Goals will be collaboratively established with patient/family Outcome: Progressing   Problem: Self-Care: Goal: Ability to participate in self-care as condition permits will improve Outcome: Progressing Goal: Verbalization of feelings and concerns over difficulty with self-care will improve Outcome: Progressing Goal: Ability to communicate needs accurately will improve Outcome: Progressing   Problem: Nutrition: Goal: Risk of aspiration will decrease Outcome: Progressing Goal: Dietary intake will improve Outcome: Progressing   Problem: Education: Goal: Knowledge of General Education information will improve Description: Including pain rating scale, medication(s)/side effects and non-pharmacologic comfort measures Outcome: Progressing   

## 2023-07-16 ENCOUNTER — Inpatient Hospital Stay: Payer: Medicare Other | Admitting: Radiology

## 2023-07-16 DIAGNOSIS — D696 Thrombocytopenia, unspecified: Secondary | ICD-10-CM

## 2023-07-16 DIAGNOSIS — I9589 Other hypotension: Secondary | ICD-10-CM | POA: Diagnosis not present

## 2023-07-16 DIAGNOSIS — I5022 Chronic systolic (congestive) heart failure: Secondary | ICD-10-CM

## 2023-07-16 DIAGNOSIS — E870 Hyperosmolality and hypernatremia: Secondary | ICD-10-CM

## 2023-07-16 DIAGNOSIS — D518 Other vitamin B12 deficiency anemias: Secondary | ICD-10-CM

## 2023-07-16 DIAGNOSIS — N939 Abnormal uterine and vaginal bleeding, unspecified: Principal | ICD-10-CM

## 2023-07-16 DIAGNOSIS — N189 Chronic kidney disease, unspecified: Secondary | ICD-10-CM

## 2023-07-16 DIAGNOSIS — I4891 Unspecified atrial fibrillation: Secondary | ICD-10-CM | POA: Diagnosis not present

## 2023-07-16 DIAGNOSIS — E440 Moderate protein-calorie malnutrition: Secondary | ICD-10-CM

## 2023-07-16 DIAGNOSIS — I639 Cerebral infarction, unspecified: Secondary | ICD-10-CM | POA: Diagnosis not present

## 2023-07-16 DIAGNOSIS — D62 Acute posthemorrhagic anemia: Secondary | ICD-10-CM | POA: Diagnosis not present

## 2023-07-16 DIAGNOSIS — N179 Acute kidney failure, unspecified: Secondary | ICD-10-CM | POA: Insufficient documentation

## 2023-07-16 HISTORY — PX: IR EMBO ART  VEN HEMORR LYMPH EXTRAV  INC GUIDE ROADMAPPING: IMG5450

## 2023-07-16 HISTORY — PX: IR IVC FILTER PLMT / S&I /IMG GUID/MOD SED: IMG701

## 2023-07-16 LAB — TYPE AND SCREEN
ABO/RH(D): AB POS
Antibody Screen: NEGATIVE
Unit division: 0
Unit division: 0
Unit division: 0

## 2023-07-16 LAB — BPAM RBC
Blood Product Expiration Date: 202409202359
Blood Product Expiration Date: 202409252359
Blood Product Expiration Date: 202409252359
ISSUE DATE / TIME: 202409081206
ISSUE DATE / TIME: 202409101148
Unit Type and Rh: 1700
Unit Type and Rh: 7300
Unit Type and Rh: 7300

## 2023-07-16 LAB — CBC
HCT: 24.7 % — ABNORMAL LOW (ref 36.0–46.0)
Hemoglobin: 7.9 g/dL — ABNORMAL LOW (ref 12.0–15.0)
MCH: 31 pg (ref 26.0–34.0)
MCHC: 32 g/dL (ref 30.0–36.0)
MCV: 96.9 fL (ref 80.0–100.0)
Platelets: 86 10*3/uL — ABNORMAL LOW (ref 150–400)
RBC: 2.55 MIL/uL — ABNORMAL LOW (ref 3.87–5.11)
RDW: 21 % — ABNORMAL HIGH (ref 11.5–15.5)
WBC: 17.2 10*3/uL — ABNORMAL HIGH (ref 4.0–10.5)
nRBC: 1.9 % — ABNORMAL HIGH (ref 0.0–0.2)

## 2023-07-16 LAB — PHOSPHORUS: Phosphorus: 2.5 mg/dL (ref 2.5–4.6)

## 2023-07-16 LAB — BASIC METABOLIC PANEL
Anion gap: 3 — ABNORMAL LOW (ref 5–15)
BUN: 29 mg/dL — ABNORMAL HIGH (ref 8–23)
CO2: 22 mmol/L (ref 22–32)
Calcium: 8.6 mg/dL — ABNORMAL LOW (ref 8.9–10.3)
Chloride: 114 mmol/L — ABNORMAL HIGH (ref 98–111)
Creatinine, Ser: 1.29 mg/dL — ABNORMAL HIGH (ref 0.44–1.00)
GFR, Estimated: 39 mL/min — ABNORMAL LOW (ref >60)
Glucose, Bld: 115 mg/dL — ABNORMAL HIGH (ref 70–99)
Potassium: 4.4 mmol/L (ref 3.5–5.1)
Sodium: 139 mmol/L (ref 135–145)

## 2023-07-16 LAB — PREPARE RBC (CROSSMATCH)

## 2023-07-16 LAB — MAGNESIUM: Magnesium: 2.8 mg/dL — ABNORMAL HIGH (ref 1.7–2.4)

## 2023-07-16 LAB — HEPARIN LEVEL (UNFRACTIONATED): Heparin Unfractionated: 0.74 [IU]/mL — ABNORMAL HIGH (ref 0.30–0.70)

## 2023-07-16 MED ORDER — FENTANYL CITRATE (PF) 100 MCG/2ML IJ SOLN
INTRAMUSCULAR | Status: AC | PRN
Start: 2023-07-16 — End: 2023-07-16
  Administered 2023-07-16: 12.5 ug via INTRAVENOUS

## 2023-07-16 MED ORDER — FENTANYL CITRATE (PF) 100 MCG/2ML IJ SOLN
INTRAMUSCULAR | Status: AC
  Filled 2023-07-16: qty 2

## 2023-07-16 MED ORDER — CEFAZOLIN SODIUM-DEXTROSE 2-4 GM/100ML-% IV SOLN: 2.0000 g | INTRAVENOUS | Status: DC

## 2023-07-16 MED ORDER — IOHEXOL 300 MG/ML  SOLN
110.0000 mL | Freq: Once | INTRAMUSCULAR | Status: AC | PRN
  Administered 2023-07-16: 110 mL via INTRA_ARTERIAL

## 2023-07-16 MED ORDER — MIDAZOLAM HCL 2 MG/2ML IJ SOLN
INTRAMUSCULAR | Status: AC | PRN
Start: 2023-07-16 — End: 2023-07-16
  Administered 2023-07-16: .5 mg via INTRAVENOUS

## 2023-07-16 MED ORDER — MIDAZOLAM HCL 2 MG/2ML IJ SOLN
INTRAMUSCULAR | Status: AC
  Filled 2023-07-16: qty 2

## 2023-07-16 MED ORDER — CEFAZOLIN SODIUM-DEXTROSE 2-4 GM/100ML-% IV SOLN
INTRAVENOUS | Status: AC
  Filled 2023-07-16: qty 100

## 2023-07-16 MED ORDER — CHLORHEXIDINE GLUCONATE CLOTH 2 % EX PADS: 6.0000 | MEDICATED_PAD | Freq: Every day | CUTANEOUS | Status: DC

## 2023-07-16 MED ORDER — LIDOCAINE HCL 1 % IJ SOLN
10.0000 mL | Freq: Once | INTRAMUSCULAR | Status: AC
  Administered 2023-07-16: 10 mL via INTRADERMAL
  Filled 2023-07-16: qty 10

## 2023-07-16 MED ORDER — LIDOCAINE HCL 1 % IJ SOLN
INTRAMUSCULAR | Status: AC
  Filled 2023-07-16: qty 20

## 2023-07-16 MED ORDER — CEFAZOLIN SODIUM-DEXTROSE 2-4 GM/100ML-% IV SOLN
INTRAVENOUS | Status: AC | PRN
  Administered 2023-07-16: 2 g via INTRAVENOUS

## 2023-07-16 MED ORDER — SODIUM CHLORIDE 0.9% IV SOLUTION: Freq: Once | INTRAVENOUS | Status: AC

## 2023-07-16 NOTE — Assessment & Plan Note (Addendum)
Diet and supplements as tolerated

## 2023-07-16 NOTE — Consult Note (Signed)
Chief Complaint: Patient was seen in consultation today for vaginal bleeding   Referring Physician(s): Alford Highland, MD  Supervising Physician: Pernell Dupre  Patient Status: [facility] - In-pt  History of Present Illness: Marissa Gilmore is a 89 y.o. female with PMH significant for atrial fibrillation, diastolic heart failure, and hypertension being seen today in relation to vaginal bleeding. Patient initially presented to [facility] ED on 07/08/23 with concern for 2-day history of vaginal bleeding. Patient developed a-fib with RVR while in the ER with subsequent development of left-sided facial droop. Patient was found to have acute CVA at that time but has been unable to start anticoagulation due to vaginal bleeding. Patient has had persistent vaginal bleeding since that time and CTA on 9/10 additionally revealed presence of pulmonary embolism. IR was consulted to evaluate patient for possible intervention.  Past Medical History:  Diagnosis Date   Atrial fibrillation (HCC)    Cataract    Diastolic heart failure (HCC)    35% EF   Hypertension     Past Surgical History:  Procedure Laterality Date   CATARACT EXTRACTION Right    DILATION AND CURETTAGE OF UTERUS N/A 07/12/2023   Procedure: DILATATION AND CURETTAGE;  Surgeon: Schermerhorn, Ihor Austin, MD;  Location: [facility] ORS;  Service: Gynecology;  Laterality: N/A;    Allergies: Patient has no known allergies.  Medications: Prior to Admission medications   Medication Sig Start Date End Date Taking? Authorizing Provider  amiodarone (PACERONE) 200 MG tablet Take 200 mg by mouth as directed. Take 1 tablet (200 mg total) by mouth as directed Take 1 tablet twice daily for 2 weeks. Thereafter take 1 tablet daily. 06/24/23  Yes [provider]  atorvastatin (LIPITOR) 40 MG tablet Take 1 tablet (40 mg total) by mouth daily. 02/06/23  Yes Bacigalupo, Marzella Schlein, MD  ELIQUIS 2.5 MG TABS tablet SMARTSIG:1 Tablet(s) By Mouth Every 12 Hours    Yes [provider]  metoprolol tartrate (LOPRESSOR) 25 MG tablet Take 25 mg by mouth 2 (two) times daily.  04/01/19  Yes [provider]  potassium chloride (KLOR-CON) 20 MEQ packet Take 20 mEq by mouth daily. 03/30/21  Yes [provider]  sacubitril-valsartan (ENTRESTO) 24-26 MG Take 1 tablet by mouth 2 (two) times daily.  05/25/19  Yes [provider]  spironolactone (ALDACTONE) 25 MG tablet Take 12.5 mg by mouth daily.   Yes [provider]  torsemide (DEMADEX) 20 MG tablet Take 20 mg by mouth 2 (two) times daily. 06/01/21  Yes [provider]  furosemide (LASIX) 40 MG tablet TAKE 1 TABLET BY MOUTH EVERY DAY Patient not taking: Reported on 07/09/2023 12/04/20   Erasmo Downer, MD     Family History  Problem Relation Age of Onset   Heart disease Mother        no MI   Peripheral Artery Disease Mother 54   Bladder Cancer Father 45    Social History   Socioeconomic History   Marital status: Single    Spouse name: Not on file   Number of children: 0   Years of education: Not on file   Highest education level: 12th grade  Occupational History   Occupation: retired    Comment: Manufacturing engineer  Tobacco Use   Smoking status: Never   Smokeless tobacco: Never  Vaping Use   Vaping status: Never Used  Substance and Sexual Activity   Alcohol use: No   Drug use: No   Sexual activity: Not on file  Other Topics Concern   Not on file  Social History Narrative   Not on file   Social Determinants of Health   Financial Resource Strain: Low Risk  (09/16/2022)   Overall Financial Resource Strain (CARDIA)    Difficulty of Paying Living Expenses: Not hard at all  Food Insecurity: No Food Insecurity (07/09/2023)   Hunger Vital Sign    Worried About Running Out of Food in the Last Year: Never true    Ran Out of Food in the Last Year: Never true  Transportation Needs: No Transportation Needs (07/09/2023)   PRAPARE - Therapist, art (Medical): No    Lack of Transportation (Non-Medical): No  Physical Activity: Insufficiently Active (09/16/2022)   Exercise Vital Sign    Days of Exercise per Week: 3 days    Minutes of Exercise per Session: 30 min  Stress: No Stress Concern Present (09/16/2022)   [employer] of Occupational Health - Occupational Stress Questionnaire    Feeling of Stress : Not at all  Social Connections: Moderately Isolated (09/16/2022)   Social Connection and Isolation Panel [NHANES]    Frequency of Communication with Friends and Family: Twice a week    Frequency of Social Gatherings with Friends and Family: Twice a week    Attends Religious Services: More than 4 times per year    Active Member of [employer] or Organizations: No    Attends Banker Meetings: Never    Marital Status: Never married    Code Status: Patient currently has DNR order in place. Discussion with the patient and family regarding wishes.  The original DNR order is maintained and prior treatment limitations are upheld. No CPR or chest compressions, pre-arrest interventions desired  Review of Systems: A 12 point ROS discussed and pertinent positives are indicated in the HPI above.  All other systems are negative.  Review of Systems  Constitutional:  Negative for chills and fever.  Respiratory:  Negative for chest tightness and shortness of breath.   Gastrointestinal:  Negative for abdominal pain, diarrhea, nausea and vomiting.  Genitourinary:  Positive for vaginal bleeding.  Neurological:  Negative for dizziness and headaches.  Psychiatric/Behavioral:  Negative for confusion.     Vital Signs: BP (!) 141/65 (BP Location: Left Arm)   Pulse 72   Temp 97.6 F (36.4 C)   Resp 18   Ht 5\' 4"  (1.626 m)   Wt 135 lb 5.8 oz (61.4 kg)   SpO2 99%   BMI 23.23 kg/m    Physical Exam Vitals reviewed.  Constitutional:      General: She is not in acute distress.    Appearance: She is  ill-appearing.  Cardiovascular:     Rate and Rhythm: Normal rate. Rhythm irregular.     Pulses: Normal pulses.     Heart sounds: Normal heart sounds.  Pulmonary:     Effort: Pulmonary effort is normal.     Breath sounds: Normal breath sounds.  Abdominal:     Palpations: Abdomen is soft.     Tenderness: There is no abdominal tenderness.  Musculoskeletal:     Right lower leg: Edema present.     Left lower leg: Edema present.  Skin:    General: Skin is warm and dry.  Neurological:     Mental Status: She is alert and oriented to person, place, and time.  Psychiatric:        Mood and Affect: Mood normal.  Behavior: Behavior normal.        Thought Content: Thought content normal.        Judgment: Judgment normal.     Imaging: CT Angio Abd/Pel w/ and/or w/o  Result Date: 07/15/2023 CLINICAL DATA:  Postmenopausal bleeding EXAM: CTA ABDOMEN AND PELVIS WITHOUT AND WITH CONTRAST TECHNIQUE: Multidetector CT imaging of the abdomen and pelvis was performed using the standard protocol during bolus administration of intravenous contrast. Multiplanar reconstructed images and MIPs were obtained and reviewed to evaluate the vascular anatomy. RADIATION DOSE REDUCTION: This exam was performed according to the departmental dose-optimization program which includes automated exposure control, adjustment of the mA and/or kV according to patient size and/or use of iterative reconstruction technique. CONTRAST:  75mL OMNIPAQUE IOHEXOL 350 MG/ML SOLN COMPARISON:  None Available. FINDINGS: VASCULAR Aorta: No aneurysm or dissection. Extensive atherosclerotic calcification. Celiac: Patent without evidence of aneurysm, dissection, vasculitis or significant stenosis. SMA: Patent without evidence of aneurysm, dissection, vasculitis or significant stenosis. Renals: Both renal arteries are patent without evidence of aneurysm, dissection, vasculitis, fibromuscular dysplasia or significant stenosis. IMA: Patent without  evidence of aneurysm, dissection, vasculitis or significant stenosis. Inflow: Patent without evidence of aneurysm, dissection, vasculitis or significant stenosis. Proximal Outflow: Bilateral common femoral and visualized portions of the superficial and profunda femoral arteries are patent without evidence of aneurysm, dissection, vasculitis or significant stenosis. Veins: Filling defect within the left common femoral vein at axial image # 68/15 is in keeping with a focal deep venous thrombus. A filling defect is also incidentally noted within the posterior segmental pulmonary artery of the right lower lobe the superior margin of the examination (image # 1/12) in keeping with an acute pulmonary embolus. There is marked mass effect upon the right external iliac vein by the enlarged uterus, best seen on image # 859/15. Review of the MIP images confirms the above findings. NON-VASCULAR Lower chest: As noted above, a acute pulmonary embolus partially visualized within the right lower lobe. Cardiac size is mildly enlarged. Hepatobiliary: No focal liver abnormality is seen. No gallstones, gallbladder wall thickening, or biliary dilatation. Pancreas: Unremarkable Spleen: Unremarkable Adrenals/Urinary Tract: The adrenal glands are unremarkable. The kidneys are normal. Foley catheter balloon is seen looped within the bladder lumen which is partially decompressed. Stomach/Bowel: Stomach is within normal limits. Appendix appears normal. No evidence of bowel wall thickening, distention, or inflammatory changes. Lymphatic: No pathologic adenopathy within the abdomen and pelvis. Reproductive: The uterus is enlarged and lobulated with multiple heterogeneously hypoenhancing masses likely representing multiple uterine fibroids. The endometrial cavity appears distended and there is heterogeneous intraluminal contents, best seen on sagittal image # 82/11 which may represent blood product or soft tissue as could be seen endometrial  carcinoma. This hyperdense material appears to extend into the endocervical canal, but is not well assessed on this examination. No adnexal masses are seen. Other: Tiny fat containing umbilical hernia. Musculoskeletal: No acute bone abnormality. No lytic or blastic bone lesion. IMPRESSION: 1. Acute pulmonary embolus within the posterior segmental pulmonary artery of the right lower lobe. 2. Focal deep venous thrombus within the left common femoral vein. 3. Marked mass effect upon the right external iliac vein by the enlarged uterus. 4. Enlarged, lobulated uterus with multiple heterogeneously hypoenhancing masses likely representing multiple uterine fibroids. 5. Distended endometrial cavity with heterogeneous intraluminal contents which may represent blood product or soft tissue as could be seen endometrial carcinoma. This hyperdense material appears to extend into the endocervical canal, but is not well assessed on this examination. Correlation  with dedicated pelvic sonography may be helpful for further evaluation. Aortic Atherosclerosis (ICD10-I70.0). Electronically Signed   By: Helyn Numbers M.D.   On: 07/15/2023 20:12   US Carotid Bilateral  Result Date: 07/12/2023 CLINICAL DATA:  Stroke.  History of hypertension and hyperlipidemia. EXAM: BILATERAL CAROTID DUPLEX ULTRASOUND TECHNIQUE: Wallace Cullens scale imaging, color Doppler and duplex ultrasound were performed of bilateral carotid and vertebral arteries in the neck. COMPARISON:  None Available. FINDINGS: Criteria: Quantification of carotid stenosis is based on velocity parameters that correlate the residual internal carotid diameter with NASCET-based stenosis levels, using the diameter of the distal internal carotid lumen as the denominator for stenosis measurement. The following velocity measurements were obtained: RIGHT ICA: 103/17 cm/sec CCA: 95/8 cm/sec SYSTOLIC ICA/CCA RATIO:  1.1 ECA: 159 cm/sec LEFT ICA: 147/42 cm/sec CCA: 116/14 cm/sec SYSTOLIC ICA/CCA  RATIO:  0.3 ECA: 105 cm/sec RIGHT CAROTID ARTERY: There is a moderate amount of eccentric echogenic plaque within the distal aspect of the right common carotid artery (image 11). There is a moderate amount of eccentric echogenic plaque within the right carotid bulb (images 14 and 15), extending to involve the origin and proximal aspects of the right internal carotid artery (image 22), not resulting in elevated peak systolic velocities within the interrogated course of the right internal carotid artery to suggest a hemodynamically significant stenosis. RIGHT VERTEBRAL ARTERY:  Antegrade flow LEFT CAROTID ARTERY: There is a moderate amount of eccentric echogenic partially shadowing plaque within left carotid bulb (images 45 and 46), extending to involve the origin and proximal aspects of the right internal carotid artery (image 53), which results in elevated peak systolic velocities within the proximal and mid aspects of the left internal carotid artery. Greatest acquired peak systolic velocity within the proximal left ICA measures 147 centimeters/second (image 55). LEFT VERTEBRAL ARTERY:  Antegrade flow IMPRESSION: 1. Moderate amount of left-sided atherosclerotic plaque results in elevated peak systolic velocities of the left internal carotid artery compatible with the 50-69% luminal narrowing range. Further evaluation with CTA could performed as indicated. 2. Moderate amount of right-sided atherosclerotic plaque, not definitively resulting in a hemodynamically significant stenosis. Electronically Signed   By: Simonne Come M.D.   On: 07/12/2023 13:04   ECHOCARDIOGRAM COMPLETE  Result Date: 07/09/2023    ECHOCARDIOGRAM REPORT   Patient Name:   [facility] Date of Exam: 07/09/2023 Medical Rec #:  [id number]          Height:       64.0 in Accession #:    [id number]         Weight:       128.0 lb Date of Birth:  07/13/1931          BSA:          1.618 m Patient Age:    89 years           BP:           101/62 mmHg  Patient Gender: F                  HR:           119 bpm. Exam Location:  [facility] Procedure: 2D Echo, Cardiac Doppler and Color Doppler Indications:     Stroke  History:         Patient has no prior history of Echocardiogram examinations.                  Stroke, Arrythmias:Atrial Fibrillation, Signs/Symptoms:Dyspnea  and Fatigue; Risk Factors:Hypertension and Dyslipidemia.  Sonographer:     Mikki Harbor Referring Phys:  [id number] Andris Baumann Diagnosing Phys: [name] MD IMPRESSIONS  1. Left ventricular ejection fraction, by estimation, is 55 to 60%. The left ventricle has normal function. The left ventricle has no regional wall motion abnormalities. Left ventricular diastolic function could not be evaluated.  2. Right ventricular systolic function is normal. The right ventricular size is normal. There is moderately elevated pulmonary artery systolic pressure.  3. Left atrial size was mildly dilated.  4. The mitral valve is degenerative. Mild to moderate mitral valve regurgitation.  5. Tricuspid valve regurgitation is moderate.  6. The aortic valve is normal in structure. Aortic valve regurgitation is trivial. FINDINGS  Left Ventricle: Left ventricular ejection fraction, by estimation, is 55 to 60%. The left ventricle has normal function. The left ventricle has no regional wall motion abnormalities. The left ventricular internal cavity size was normal in size. There is  no left ventricular hypertrophy. Left ventricular diastolic function could not be evaluated. Right Ventricle: The right ventricular size is normal. No increase in right ventricular wall thickness. Right ventricular systolic function is normal. There is moderately elevated pulmonary artery systolic pressure. The tricuspid regurgitant velocity is 3.32 m/s, and with an assumed right atrial pressure of 8 mmHg, the estimated right ventricular systolic pressure is 52.1 mmHg. Left Atrium: Left atrial size was mildly dilated. Right  Atrium: Right atrial size was normal in size. Pericardium: There is no evidence of pericardial effusion. Mitral Valve: Eccentric posterior jet. The mitral valve is degenerative in appearance. Mild to moderate mitral valve regurgitation. MV peak gradient, 4.0 mmHg. The mean mitral valve gradient is 2.0 mmHg. Tricuspid Valve: Eccentric posterior jet. The tricuspid valve is grossly normal. Tricuspid valve regurgitation is moderate. Aortic Valve: The aortic valve is normal in structure. Aortic valve regurgitation is trivial. Aortic valve mean gradient measures 5.7 mmHg. Aortic valve peak gradient measures 11.9 mmHg. Aortic valve area, by VTI measures 1.57 cm. Pulmonic Valve: The pulmonic valve was normal in structure. Pulmonic valve regurgitation is not visualized. Aorta: The ascending aorta was not well visualized. IAS/Shunts: No atrial level shunt detected by color flow Doppler.  LEFT VENTRICLE PLAX 2D LVIDd:         4.40 cm LVIDs:         3.10 cm LV PW:         1.10 cm LV IVS:        0.80 cm LVOT diam:     2.00 cm LV SV:         50 LV SV Index:   31 LVOT Area:     3.14 cm  RIGHT VENTRICLE RV Basal diam:  3.60 cm RV Mid diam:    2.70 cm RV S prime:     13.40 cm/s LEFT ATRIUM             Index        RIGHT ATRIUM           Index LA diam:        4.30 cm 2.66 cm/m   RA Area:     22.30 cm LA Vol (A2C):   93.7 ml 57.90 ml/m  RA Volume:   64.70 ml  39.98 ml/m LA Vol (A4C):   78.7 ml 48.63 ml/m LA Biplane Vol: 85.8 ml 53.02 ml/m  AORTIC VALVE  PULMONIC VALVE AV Area (Vmax):    1.64 cm      PV Vmax:       1.32 m/s AV Area (Vmean):   1.44 cm      PV Peak grad:  7.0 mmHg AV Area (VTI):     1.57 cm AV Vmax:           172.67 cm/s AV Vmean:          110.000 cm/s AV VTI:            0.319 m AV Peak Grad:      11.9 mmHg AV Mean Grad:      5.7 mmHg LVOT Vmax:         90.20 cm/s LVOT Vmean:        50.450 cm/s LVOT VTI:          0.159 m LVOT/AV VTI ratio: 0.50  AORTA Ao Root diam: 3.00 cm MITRAL VALVE                TRICUSPID VALVE MV Area (PHT): 5.62 cm    TR Peak grad:   44.1 mmHg MV Area VTI:   2.93 cm    TR Vmax:        332.00 cm/s MV Peak grad:  4.0 mmHg MV Mean grad:  2.0 mmHg    SHUNTS MV Vmax:       1.00 m/s    Systemic VTI:  0.16 m MV Vmean:      58.3 cm/s   Systemic Diam: 2.00 cm MV Decel Time: 135 msec MV E velocity: 91.70 cm/s [name] MD Electronically signed by [name] MD Signature Date/Time: 07/09/2023/1:15:37 PM    Final    MR BRAIN WO CONTRAST  Addendum Date: 07/09/2023   ADDENDUM REPORT: 07/09/2023 03:11 ADDENDUM: In addition to the initially described findings, note is made of a 9 mm T1 hypointense lesion within the C4 vertebral body, indeterminate. This is described in the body of the report, omitted from the impression by mistake. Further evaluation with dedicated MRI of the cervical spine, with and without contrast, suggested for further evaluation as warranted. Electronically Signed   By: Rise Mu M.D.   On: 07/09/2023 03:11   Result Date: 07/09/2023 CLINICAL DATA:  Initial evaluation for neuro deficit, stroke suspected. Left-sided deficit. EXAM: MRI HEAD WITHOUT CONTRAST MRA HEAD WITHOUT CONTRAST TECHNIQUE: Multiplanar, multi-echo pulse sequences of the brain and surrounding structures were acquired without intravenous contrast. Angiographic images of the Circle of Willis were acquired using MRA technique without intravenous contrast. COMPARISON:  Comparison made with prior CT from 07/08/2023. FINDINGS: MRI HEAD FINDINGS Brain: Cerebral volume within normal limits. Minimal hazy FLAIR signal abnormality noted involving the periventricular Mowbray matter, likely related to chronic microvascular ischemic disease, minimal in nature and less than is typically seen for age. Tiny remote infarct present at the right occipital lobe (series 20, image 21). Additional tiny remote left cerebellar infarct noted. Subtle focus of diffusion signal abnormality measuring  approximately 1.5 cm seen at the right frontal corona radiata (series 10, image 78). Associated subtle signal loss on ADC map (series 11, image 30). Findings suspicious for a possible acute ischemic infarct. No associated hemorrhage or mass effect. No other evidence for acute or subacute ischemia. Gray-Shall matter differentiation otherwise maintained. No areas of chronic cortical infarction. No significant acute or chronic intracranial blood products. No mass lesion, midline shift or mass effect. No hydrocephalus or extra-axial fluid collection. Pituitary gland and suprasellar  region within normal limits. Vascular: Major intracranial vascular flow voids are maintained. Skull and upper cervical spine: Craniocervical junction within normal limits. 9 mm T1 hypointense lesion noted within the visualized C4 vertebral body (series 14, image 12), indeterminate. Bone marrow signal intensity otherwise normal. No scalp soft tissue abnormality. Sinuses/Orbits: Prior ocular lens replacement on the right. Globes and orbital soft tissues otherwise unremarkable. Paranasal sinuses are largely clear. No significant mastoid effusion. Other: None. MRA HEAD FINDINGS Anterior circulation: Visualized distal cervical segments of the internal carotid arteries are mildly tortuous but patent with antegrade flow. Petrous, cavernous, and supraclinoid segments patent. Left A1 segment widely patent. Focal severe right A1 stenosis (series 78295, image 8). Normal anterior communicating artery complex. Anterior cerebral arteries patent without stenosis. No M1 stenosis or occlusion. Left MCA branches well perfused. On the right, there is acute occlusion of a proximal right M2 branch, superior division (series 62130, image 13). Inferior division remains patent and perfused, although a probable moderate proximal right M2 stenosis noted (series 86578, image 12). Posterior circulation: Both vertebral arteries are patent to the vertebrobasilar junction  without visible stenosis. Right PICA patent. Left PICA not well seen. Basilar patent without stenosis. Superior cerebral arteries patent bilaterally. Both PCAs primarily supplied via the basilar. Focal moderate left P2 stenosis (series 1021, image 2). Left PCA otherwise patent. On the right, there is a severe distal right P2 stenosis (series 1021, image 7). Right PCA is markedly attenuated distally and only faintly visible on 3D time-of-flight sequence. Anatomic variants: None significant.  No aneurysm. IMPRESSION: MRI HEAD IMPRESSION: 1. Subtle 1.5 cm focus of diffusion signal abnormality involving the right frontal corona radiata, suspicious for an acute ischemic infarct. No associated hemorrhage or mass effect. 2. 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 MRA HEAD IMPRESSION: 1. Acute occlusion of a proximal right M2 branch, superior division. 2. Additional moderate proximal right M2 stenosis, inferior division. 3. Severe distal right P2 stenosis, with additional moderate left P2 stenosis. 4. Focal severe right A1 stenosis. Electronically Signed: By: Rise Mu M.D. On: 07/09/2023 03:05   MR ANGIO HEAD WO CONTRAST  Addendum Date: 07/09/2023   ADDENDUM REPORT: 07/09/2023 03:11 ADDENDUM: In addition to the initially described findings, note is made of a 9 mm T1 hypointense lesion within the C4 vertebral body, indeterminate. This is described in the body of the report, omitted from the impression by mistake. Further evaluation with dedicated MRI of the cervical spine, with and without contrast, suggested for further evaluation as warranted. Electronically Signed   By: Rise Mu M.D.   On: 07/09/2023 03:11   Result Date: 07/09/2023 CLINICAL DATA:  Initial evaluation for neuro deficit, stroke suspected. Left-sided deficit. EXAM: MRI HEAD WITHOUT CONTRAST MRA HEAD WITHOUT CONTRAST TECHNIQUE: Multiplanar, multi-echo  pulse sequences of the brain and surrounding structures were acquired without intravenous contrast. Angiographic images of the Circle of Willis were acquired using MRA technique without intravenous contrast. COMPARISON:  Comparison made with prior CT from 07/08/2023. FINDINGS: MRI HEAD FINDINGS Brain: Cerebral volume within normal limits. Minimal hazy FLAIR signal abnormality noted involving the periventricular Blatt matter, likely related to chronic microvascular ischemic disease, minimal in nature and less than is typically seen for age. Tiny remote infarct present at the right occipital lobe (series 20, image 21). Additional tiny remote left cerebellar infarct noted. Subtle focus of diffusion signal abnormality measuring approximately 1.5 cm seen at the right frontal corona radiata (series 10, image 78). Associated  subtle signal loss on ADC map (series 11, image 30). Findings suspicious for a possible acute ischemic infarct. No associated hemorrhage or mass effect. No other evidence for acute or subacute ischemia. Gray-Haste matter differentiation otherwise maintained. No areas of chronic cortical infarction. No significant acute or chronic intracranial blood products. No mass lesion, midline shift or mass effect. No hydrocephalus or extra-axial fluid collection. Pituitary gland and suprasellar region within normal limits. Vascular: Major intracranial vascular flow voids are maintained. Skull and upper cervical spine: Craniocervical junction within normal limits. 9 mm T1 hypointense lesion noted within the visualized C4 vertebral body (series 14, image 12), indeterminate. Bone marrow signal intensity otherwise normal. No scalp soft tissue abnormality. Sinuses/Orbits: Prior ocular lens replacement on the right. Globes and orbital soft tissues otherwise unremarkable. Paranasal sinuses are largely clear. No significant mastoid effusion. Other: None. MRA HEAD FINDINGS Anterior circulation: Visualized distal cervical  segments of the internal carotid arteries are mildly tortuous but patent with antegrade flow. Petrous, cavernous, and supraclinoid segments patent. Left A1 segment widely patent. Focal severe right A1 stenosis (series 40981, image 8). Normal anterior communicating artery complex. Anterior cerebral arteries patent without stenosis. No M1 stenosis or occlusion. Left MCA branches well perfused. On the right, there is acute occlusion of a proximal right M2 branch, superior division (series 19147, image 13). Inferior division remains patent and perfused, although a probable moderate proximal right M2 stenosis noted (series 82956, image 12). Posterior circulation: Both vertebral arteries are patent to the vertebrobasilar junction without visible stenosis. Right PICA patent. Left PICA not well seen. Basilar patent without stenosis. Superior cerebral arteries patent bilaterally. Both PCAs primarily supplied via the basilar. Focal moderate left P2 stenosis (series 1021, image 2). Left PCA otherwise patent. On the right, there is a severe distal right P2 stenosis (series 1021, image 7). Right PCA is markedly attenuated distally and only faintly visible on 3D time-of-flight sequence. Anatomic variants: None significant.  No aneurysm. IMPRESSION: MRI HEAD IMPRESSION: 1. Subtle 1.5 cm focus of diffusion signal abnormality involving the right frontal corona radiata, suspicious for an acute ischemic infarct. No associated hemorrhage or mass effect. 2. 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 MRA HEAD IMPRESSION: 1. Acute occlusion of a proximal right M2 branch, superior division. 2. Additional moderate proximal right M2 stenosis, inferior division. 3. Severe distal right P2 stenosis, with additional moderate left P2 stenosis. 4. Focal severe right A1 stenosis. Electronically Signed: By: Rise Mu M.D. On: 07/09/2023 03:05   US PELVIC  COMPLETE WITH TRANSVAGINAL  Result Date: 07/08/2023 CLINICAL DATA:  Vaginal bleeding in a 89 year old female EXAM: TRANSABDOMINAL ULTRASOUND OF PELVIS TECHNIQUE: Transabdominal ultrasound examination of the pelvis was performed including evaluation of the uterus, ovaries, adnexal regions, and pelvic cul-de-sac. Patient declined transvaginal exam. COMPARISON:  None Available. FINDINGS: Uterus Measurements: 50.3 x 6.6 x 8.6 cm = volume: 452 mL. Enlarged heterogenous uterus with multiple fibroids measuring up to 7.8 cm. Endometrium Thickness: 24 mm. Thickened heterogenous endometrium without definite focal lesion. Right ovary Not visualized due to enlarged heterogenous uterus and overlying bowel. Left ovary Not visualized due to enlarged heterogenous uterus and overlying. Other findings:  No abnormal free fluid. IMPRESSION: 1. Thickened heterogenous endometrium measuring up to 24 mm. In the setting of post-menopausal bleeding, endometrial sampling is indicated to exclude carcinoma. If results are benign, sonohysterogram should be considered for focal lesion work-up. (Ref: Radiological Reasoning: Algorithmic Workup of Abnormal Vaginal Bleeding with Endovaginal Sonography and Sonohysterography.  AJR 2008; 161:W96-04) 2. Enlarged heterogenous uterus with multiple fibroids. 3. Nonvisualization of the ovaries. Electronically Signed   By: Minerva Fester M.D.   On: 07/08/2023 20:49   CT HEAD CODE STROKE WO CONTRAST  Result Date: 07/08/2023 CLINICAL DATA:  Code stroke.  Neuro deficit, acute, stroke suspected EXAM: CT HEAD WITHOUT CONTRAST TECHNIQUE: Contiguous axial images were obtained from the base of the skull through the vertex without intravenous contrast. RADIATION DOSE REDUCTION: This exam was performed according to the departmental dose-optimization program which includes automated exposure control, adjustment of the mA and/or kV according to patient size and/or use of iterative reconstruction technique.  COMPARISON:  CT head 07/21/2017. FINDINGS: Brain: No evidence of acute infarction, hemorrhage, hydrocephalus, extra-axial collection or mass lesion/mass effect. Vascular: No hyperdense vessel or unexpected calcification. Skull: Normal. Negative for fracture or focal lesion. Sinuses/Orbits: No acute finding. Other: None. ASPECTS [facility] Stroke Program Early CT Score) Total score (0-10 with 10 being normal): 10. IMPRESSION: 1. No evidence of acute intracranial abnormality. 2. ASPECTS is 10. Code stroke imaging results were communicated on 07/08/2023 at 8:12 pm to provider Chesley Noon via telephone, who verbally acknowledged these results. Electronically Signed   By: Feliberto Harts M.D.   On: 07/08/2023 20:13    Labs:  CBC: Recent Labs    07/10/23 0428 07/10/23 1653 07/11/23 0630 07/11/23 1811 07/12/23 0437 07/12/23 1634 07/14/23 1826 07/15/23 0441 07/15/23 1650 07/16/23 0451  WBC 8.5  --  9.1  --  11.9*  --   --   --   --  17.2*  HGB 6.7*   < > 6.5* 8.9* 8.4*   < > 8.0* 6.9* 8.9* 7.9*  HCT 20.4*  --  19.2* 26.3* 24.3*  --   --   --   --  24.7*  PLT 150  --  134*  --  142*  --   --   --   --  86*   < > = values in this interval not displayed.    COAGS: Recent Labs    07/09/23 0406 07/09/23 0802  INR TEST WILL BE CREDITED 1.2  APTT TEST WILL BE CREDITED 28    BMP: Recent Labs    07/13/23 0438 07/14/23 0816 07/15/23 0441 07/16/23 0451  NA 146* 146* 140 139  K 4.6 4.2 4.2 4.4  CL 118* 117* 115* 114*  CO2 22 22 22 22   GLUCOSE 139* 143* 134* 115*  BUN 34* 38* 35* 29*  CALCIUM 9.3 9.6 9.1 8.6*  CREATININE 1.76* 1.63* 1.41* 1.29*  GFRNONAA 27* 30* 35* 39*    LIVER FUNCTION TESTS: Recent Labs    02/06/23 1348  BILITOT 0.7  AST 15  ALT 19  ALKPHOS 108  PROT 6.5  ALBUMIN 4.1    TUMOR MARKERS: No results for input(s): "AFPTM", "CEA", "CA199", "CHROMGRNA" in the last 8760 hours.  Assessment and Plan:  Marissa Gilmore is a 89 yo female being seen today in relation  to vaginal bleeding and pulmonary embolism. Recent CTA shows pulmonary embolism of right lower lobe. Patient has had persistent heavy vaginal bleeding. Case has been reviewed by Dr Juliette Alcide and approved for image-guided uterine artery embolization, possible ovarian artery embolization, and image-guided IVC filter placement. Patient is NPO and has heparin held. Recent INR of 1.2 on 9/4.   The risks and benefits of embolization were discussed with the patient including, but not limited to bleeding, infection, vascular injury, non-target embolization, post operative pain, or contrast induced renal failure.  Risks and benefits discussed with the patient including, but not limited to bleeding, infection, contrast induced renal failure, filter fracture or migration which can lead to emergency surgery or even death, strut penetration with damage or irritation to adjacent structures and caval thrombosis.  This procedure involves the use of X-rays and because of the nature of the planned procedure, it is possible that we will have prolonged use of X-ray fluoroscopy.  Potential radiation risks to you include (but are not limited to) the following: - A slightly elevated risk for cancer several years later in life. This risk is typically less than 0.5% percent. This risk is low in comparison to the normal incidence of human cancer, which is 33% for women and 50% for men according to the [employer] Society. - Radiation induced injury can include skin redness, resembling a rash, tissue breakdown / ulcers and hair loss (which can be temporary or permanent).   The likelihood of either of these occurring depends on the difficulty of the procedure and whether you are sensitive to radiation due to previous procedures, disease, or genetic conditions.   IF your procedure requires a prolonged use of radiation, you will be notified and given written instructions for further action.  It is your responsibility to  monitor the irradiated area for the 2 weeks following the procedure and to notify your physician if you are concerned that you have suffered a radiation induced injury.    All of the patient's questions were answered, patient is agreeable to proceed. Consent signed and in chart.   Thank you for this interesting consult.  I greatly enjoyed meeting Marissa Gilmore and look forward to participating in their care.  A copy of this report was sent to the requesting provider on this date.  Electronically Signed: Kennieth Francois, PA-C 07/16/2023, 12:24 PM   I spent a total of 20 Minutes  in face to face in clinical consultation, greater than 50% of which was counseling/coordinating care for vaginal bleeding.

## 2023-07-16 NOTE — Progress Notes (Signed)
PT Cancellation Note  Patient Details Name: Marissa Gilmore MRN: [id number] DOB: [birth year]   Cancelled Treatment:    Reason Eval/Treat Not Completed: Patient at procedure or test/unavailable Will re-attempt at later time/date.   Ruford Dudzinski 07/16/2023, 2:45 PM

## 2023-07-16 NOTE — Assessment & Plan Note (Addendum)
Discontinued B12 supplementation

## 2023-07-16 NOTE — Progress Notes (Signed)
Progress Note   Patient: Marissa Gilmore JYN:[id number] DOB: 02/16/1931 DOA: 07/08/2023     7 DOS: the patient was seen and examined on 07/16/2023   Brief hospital course: Marissa Gilmore is a 89 y.o. female with medical history significant for Chronic atrial fibrillation on Eliquis and amiodarone, systolic CHF, HTN and HLD who presented to the ED initially with a 2-day history of persistent vaginal bleeding including passage of golf ball size clots.  While in the emergency room, patient developed atrial fibrillation with RVR, then subsequently she developed left-sided facial droop and slurred speech. MRI brain showed subtle 1.5 cm focus of diffusion signal abnormality involving the right frontal corona radiata, suspicious for an acute ischemic infarct. MRA head: 1. Acute occlusion of a proximal right M2 branch, superior division. 2. Additional moderate proximal right M2 stenosis, inferior division. 3. Severe distal right P2 stenosis, with additional moderate left P2 stenosis. 4. Focal severe right A1 stenosis. Patient is seen by neurology, due to large amount of vaginal bleeding, anticoagulation was not able to start. Patient continued to have vaginal bleeding while in the hospital. D&C was performed on 9/7 for vaginal bleeding.  9/11.  Reviewed that patient received 5 units of packed red blood cells during the hospital course already.  Will give another unit of packed red blood cells today for a heme globin that dipped down to 7.9.  Case discussed with gynecology and interventional radiology and they will try a uterine artery embolization and IVC filter.  Stopped heparin drip with continued vaginal bleeding.     Assessment and Plan: * ABLA (acute blood loss anemia) Postmenopausal vaginal bleeding. Patient received 5 units of packed red blood cells during the hospital course.  Will give 1 unit of packed red blood cells today on a hemoglobin of 7.9 since she has persistent vaginal  bleeding.  Unable to give blood thinner at this point.  Discontinue heparin drip this morning.  Hold aspirin.  Case discussed with gynecology and interventional radiology and they will plan on uterine artery embolization and IVC filter today.  Serial hemoglobins.    Acute CVA (cerebrovascular accident) (HCC) Stroke in the right frontal corona radiata.  Currently unable to give blood thinner.  Patient does have dysphagia and poor appetite.  Continue Lipitor.  Hypotension Resolved  Thrombocytopenia (HCC) Continue to monitor.  Not sure if it secondary to heparin or just because the patient received quite a bit of blood transfusion.  Continue to monitor.  Hypernatremia Resolved  Malnutrition of moderate degree Continue supplements  B12 deficiency anemia Continue B12 supplementation  Atrial fibrillation with RVR (HCC) Converted to sinus rhythm.  On amiodarone and Cardizem.  Unable to give blood thinner at this time.  Chronic systolic (congestive) heart failure (HCC) EF 30% 2021 Clinically euvolemic Watch closely with blood transfusion.        Subjective: Patient feels okay.  Still having vaginal bleeding.  Patient was placed on heparin drip last night.  Case discussed with gynecology and interventional radiology and we will stop heparin drip.  Interventional radiology will try a uterine artery embolization and place an IVC filter.  Physical Exam: Vitals:   07/16/23 0011 07/16/23 0506 07/16/23 0814 07/16/23 1200  BP: 123/69 (!) 121/44 (!) 141/65 (!) 114/56  Pulse: 71 67 72 (!) 58  Resp: 18 20 18 18   Temp: 98.5 F (36.9 C) 98.4 F (36.9 C) 97.6 F (36.4 C) (!) 97.5 F (36.4 C)  TempSrc:      SpO2: 96% 99%  99% 98%  Weight:      Height:       Physical Exam HENT:     Head: Normocephalic.     Mouth/Throat:     Pharynx: No oropharyngeal exudate.  Eyes:     General: Lids are normal.     Conjunctiva/sclera: Conjunctivae normal.  Cardiovascular:     Rate and Rhythm:  Normal rate and regular rhythm.     Heart sounds: Normal heart sounds, S1 normal and S2 normal.  Pulmonary:     Breath sounds: No decreased breath sounds, wheezing, rhonchi or rales.  Abdominal:     Palpations: Abdomen is soft.     Tenderness: There is no abdominal tenderness.  Musculoskeletal:     Right lower leg: No swelling.     Left lower leg: No swelling.  Skin:    General: Skin is warm.     Findings: No rash.  Neurological:     Mental Status: She is alert and oriented to person, place, and time.     Comments: Able to straight leg raise.     Data Reviewed: Hemoglobin 7.9, platelet count 86, Kovacich blood cell count 17.2, creatinine 1.29  Family Communication: Spoke with family at the bedside  Disposition: Status is: Inpatient Remains inpatient appropriate because: Unable to give blood thinners with persistent vaginal bleeding.  Will give 1 unit of packed red blood cells today.  Case discussed with gynecology and interventional radiology and they will try a uterine artery embolization and an IVC filter.  Planned Discharge Destination: To be determined    Time spent: 29 minutes  Author: Alford Highland, MD 07/16/2023 12:29 PM  For on call review [email or web address].

## 2023-07-16 NOTE — Assessment & Plan Note (Addendum)
Platelet count 86 today.

## 2023-07-16 NOTE — Procedures (Signed)
Interventional Radiology Procedure Note  Date of Procedure: 07/16/2023  Procedure: IVC filter placement, Embolization of bilateral ovarian (gonadal) arteries   Findings:  1. Successful placement of infrarenal IVC filter  2. Right CFV access with manual pressure  3. Successful embolization of the bilateral ovarian arteries using beads and coils  4. Right CFA access with AngioSeal closure    Complications: No immediate complications noted.   Estimated Blood Loss: minimal  Follow-up and Recommendations: 1. Bedrest 4 hours, first 2 hours flat  2. Monitor access sites in right groin  3. Trend Hgb. IR will continue to follow    Olive Bass, MD  Vascular & Interventional Radiology  07/16/2023 4:40 PM

## 2023-07-16 NOTE — Progress Notes (Signed)
OT Cancellation Note  Patient Details Name: Marissa Gilmore MRN: [id number] DOB: 21-Jan-1931   Cancelled Treatment:    Reason Eval/Treat Not Completed: Patient declined, no reason specified. Upon attempt, pt visiting with family and Renato Gails. She also notes she has a procedure upcoming today. Pt agreeable to OT re-attempt at later time.   Arman Filter., MPH, MS, OTR/L ascom [phone] 07/16/23, 10:58 AM

## 2023-07-16 NOTE — Progress Notes (Signed)
Palliative Care Progress Note, Assessment & Plan   Patient Name: Marissa Gilmore       Date: 07/16/2023 DOB: [birth year]  Age: 89 y.o. MRN#: [id number] Attending Physician: Alford Highland, MD Primary Care Physician: Erasmo Downer, MD Admit Date: 07/08/2023  Subjective: Patient is sitting up in bed, sleeping, but is easily awakened.  She acknowledges my presence and is able to make her wishes known.  She is alert and oriented x 4.  Heparin gtt. is in place.  Patient's nurse, niece, and attending Dr. Renae Gloss present during my visit.  HPI: Marissa Gilmore is a 89 y.o. female with medical history significant for Chronic atrial fibrillation on Eliquis and amiodarone, systolic CHF, HTN and HLD who presented to the ED initially with a 2-day history of persistent vaginal bleeding including passage of golf ball size clots.  While in the emergency room, patient developed atrial fibrillation with RVR, then subsequently she developed left-sided facial droop and slurred speech. MRI brain showed subtle 1.5 cm focus of diffusion signal abnormality involving the right frontal corona radiata, suspicious for an acute ischemic infarct. Patient is seen by neurology, due to large amount of vaginal bleeding, anticoagulation was not able to be started. Patient continued to have vaginal bleeding while in the hospital, not able to start anticoagulation, and D&C was performed on 9/7 for vaginal bleeding.  Gyn consulted for continued vaginal bleeding and discussed uterine emoblization, to which patient is agreeable. CT abd/levis revealed DVT and acute small PE for which heparin gtt was initiated. Multiple units of RBCs given during hospitalization.  PMT was consulted for GOC discussions.   Summary of  counseling/coordination of care: After reviewing the patient's chart and assessing the patient at bedside, I counseled with the patient in regards to plan of care.  Patient  Medical update given by Dr. Renae Gloss. Results of CT reviewed and discussed. Education provided on uterine embolization, IVC filter, DVT, and heparin gtt. Again highlighted need for balancing stroke/clot prevention with stopping source of bleeding.  Patient remains in agreement for embolization procedure and IVC filter if needed.  Patient and niece given space and opportunity to ask questions.  Therapeutic silence, active listening, and emotional support provided.  DNR with limited interventions remains.   PMT will continue to follow and support patient throughout her hospitalization.   Physical Exam Vitals reviewed.  Constitutional:      General: She is not in acute distress.    Appearance: She is normal weight.  HENT:     Head: Normocephalic.     Mouth/Throat:     Comments: Left sided facial droop Eyes:     Pupils: Pupils are equal, round, and reactive to light.  Pulmonary:     Effort: Pulmonary effort is normal.  Abdominal:     Palpations: Abdomen is soft.  Musculoskeletal:     Comments: Left sided weakness  Skin:    General: Skin is warm and dry.     Coloration: Skin is pale.  Neurological:     Mental Status: She is alert and oriented to person, place, and time.  Psychiatric:        Mood and Affect: Mood normal.  Behavior: Behavior normal.        Thought Content: Thought content normal.        Judgment: Judgment normal.             Total Time 35 minutes   Cindee Mclester L. Bonita Quin, DNP, FNP-BC Palliative Medicine Team

## 2023-07-16 NOTE — Consult Note (Signed)
ANTICOAGULATION CONSULT NOTE - Initial Consult  Pharmacy Consult for Heparin infusion Indication: pulmonary embolus and DVT  No Known Allergies  Patient Measurements: Height: 5\' 4"  (162.6 cm) Weight: 61.4 kg (135 lb 5.8 oz) IBW/kg (Calculated) : 54.7 Heparin Dosing Weight: 61.4 kg  Vital Signs: Temp: 98.4 F (36.9 C) (09/11 0506) BP: 121/44 (09/11 0506) Pulse Rate: 67 (09/11 0506)  Labs: Recent Labs    07/14/23 0816 07/14/23 1826 07/15/23 0441 07/15/23 1650 07/16/23 0451  HGB  --    < > 6.9* 8.9*  --   HEPARINUNFRC  --   --   --   --  0.74*  CREATININE 1.63*  --  1.41*  --  1.29*   < > = values in this interval not displayed.    Estimated Creatinine Clearance: 25 mL/min (A) (by C-G formula based on SCr of 1.29 mg/dL (H)).   Medical History: Past Medical History:  Diagnosis Date   Atrial fibrillation (HCC)    Cataract    Diastolic heart failure (HCC)    35% EF   Hypertension     Medications:  PTA: apixaban - last dose 9/3 - HELD IP: Kcentra 50 units/kg given 9/4 at 0057  Assessment: 89 y.o. female with medical history significant for Chronic atrial fibrillation on Eliquis initially presented to ED 07/08/23 with 2-day history of persistent vaginal bleeding including passage of golf ball size clots. Given K-centra for apixaban reversal. S/p D&C 07/12/23  as apixaban continued to to held during admission for vaginal bleeding/low hemoglobin requiring multiple blood transfusions 07/15/23: CT: Acute pulmonary embolus; Focal deep venous thrombus within the left common femoral vein.  Pharmacy has been consulted to initiate and manage IV heparin therapy.    Goal of Therapy:  Heparin level 0.3-0.7 units/ml Monitor platelets by anticoagulation protocol: Yes   Plan:  9/11:  HL @ 0451 = 0.74, elevated  - Will decrease heparin infusion rate to 950 units/hr and recheck HL 8 hrs after rate change  Follow hemoglobin closely  Continue to monitor H&H and  platelets  Lavonta Tillis D, PharmD Clinical Pharmacist   07/16/2023,5:44 AM

## 2023-07-16 NOTE — Assessment & Plan Note (Signed)
Resolved

## 2023-07-17 ENCOUNTER — Other Ambulatory Visit: Payer: Medicare Other

## 2023-07-17 ENCOUNTER — Inpatient Hospital Stay: Payer: Medicare Other

## 2023-07-17 DIAGNOSIS — I5022 Chronic systolic (congestive) heart failure: Secondary | ICD-10-CM | POA: Diagnosis not present

## 2023-07-17 DIAGNOSIS — I639 Cerebral infarction, unspecified: Secondary | ICD-10-CM | POA: Diagnosis not present

## 2023-07-17 DIAGNOSIS — N939 Abnormal uterine and vaginal bleeding, unspecified: Secondary | ICD-10-CM | POA: Diagnosis not present

## 2023-07-17 DIAGNOSIS — I4891 Unspecified atrial fibrillation: Secondary | ICD-10-CM

## 2023-07-17 DIAGNOSIS — D62 Acute posthemorrhagic anemia: Secondary | ICD-10-CM | POA: Diagnosis not present

## 2023-07-17 LAB — BPAM RBC
Blood Product Expiration Date: 202409252359
ISSUE DATE / TIME: 202409112059
Unit Type and Rh: 7300

## 2023-07-17 LAB — GLUCOSE, CAPILLARY
Glucose-Capillary: 144 mg/dL — ABNORMAL HIGH (ref 70–99)
Glucose-Capillary: 116 mg/dL — ABNORMAL HIGH (ref 70–99)

## 2023-07-17 LAB — CBC
HCT: 29.2 % — ABNORMAL LOW (ref 36.0–46.0)
Hemoglobin: 9.7 g/dL — ABNORMAL LOW (ref 12.0–15.0)
MCH: 30.7 pg (ref 26.0–34.0)
MCHC: 33.2 g/dL (ref 30.0–36.0)
MCV: 92.4 fL (ref 80.0–100.0)
Platelets: 86 10*3/uL — ABNORMAL LOW (ref 150–400)
RBC: 3.16 MIL/uL — ABNORMAL LOW (ref 3.87–5.11)
RDW: 20.2 % — ABNORMAL HIGH (ref 11.5–15.5)
WBC: 18.8 10*3/uL — ABNORMAL HIGH (ref 4.0–10.5)
nRBC: 1.8 % — ABNORMAL HIGH (ref 0.0–0.2)

## 2023-07-17 LAB — BASIC METABOLIC PANEL
Anion gap: 9 (ref 5–15)
BUN: 29 mg/dL — ABNORMAL HIGH (ref 8–23)
CO2: 19 mmol/L — ABNORMAL LOW (ref 22–32)
Calcium: 9 mg/dL (ref 8.9–10.3)
Chloride: 109 mmol/L (ref 98–111)
Creatinine, Ser: 1.26 mg/dL — ABNORMAL HIGH (ref 0.44–1.00)
GFR, Estimated: 41 mL/min — ABNORMAL LOW (ref >60)
Glucose, Bld: 122 mg/dL — ABNORMAL HIGH (ref 70–99)
Potassium: 4.7 mmol/L (ref 3.5–5.1)
Sodium: 137 mmol/L (ref 135–145)

## 2023-07-17 LAB — MRSA NEXT GEN BY PCR, NASAL: MRSA by PCR Next Gen: NOT DETECTED

## 2023-07-17 LAB — TYPE AND SCREEN
ABO/RH(D): AB POS
Antibody Screen: NEGATIVE
Unit division: 0

## 2023-07-17 MED ORDER — CHLORHEXIDINE GLUCONATE CLOTH 2 % EX PADS
6.0000 | MEDICATED_PAD | Freq: Every day | CUTANEOUS | Status: DC
  Administered 2023-07-17 – 2023-07-18 (×2): 6 via TOPICAL

## 2023-07-17 MED ORDER — METOPROLOL TARTRATE 5 MG/5ML IV SOLN: 5.0000 mg | INTRAVENOUS | Status: DC | PRN

## 2023-07-17 MED ORDER — ASPIRIN 81 MG PO CHEW
81.0000 mg | CHEWABLE_TABLET | Freq: Every day | ORAL | Status: DC
  Administered 2023-07-17: 81 mg via ORAL
  Filled 2023-07-17: qty 1

## 2023-07-17 MED ORDER — IOHEXOL 350 MG/ML SOLN
100.0000 mL | Freq: Once | INTRAVENOUS | Status: AC | PRN
  Administered 2023-07-17: 100 mL via INTRAVENOUS

## 2023-07-17 MED ORDER — DEXTROSE-SODIUM CHLORIDE 5-0.9 % IV SOLN: INTRAVENOUS | Status: DC

## 2023-07-17 MED ORDER — DILTIAZEM HCL 60 MG PO TABS
60.0000 mg | ORAL_TABLET | Freq: Three times a day (TID) | ORAL | Status: DC
  Administered 2023-07-17 – 2023-07-18 (×3): 60 mg via ORAL
  Filled 2023-07-17 (×3): qty 1

## 2023-07-17 MED ORDER — DILTIAZEM HCL 25 MG/5ML IV SOLN
10.0000 mg | Freq: Once | INTRAVENOUS | Status: AC
  Administered 2023-07-17: 10 mg via INTRAVENOUS
  Filled 2023-07-17: qty 5

## 2023-07-17 NOTE — Progress Notes (Signed)
Messaged by Primary nurse Mindy and NP Manuela Schwartz to come and assess Femoral site.  0020  Assessed and Notified NP that bleeding has stopped or really slowed down site is soft.... pulses still paplable.Marland KitchenMarland KitchenBrusiing is still contained in previous marking and no bruising on her back/flank area.    0300 No bleeding at site and site is soft.. Brusing still contained in previous marking and no bruising on her back flank area. Pulses still palpable

## 2023-07-17 NOTE — TOC Progression Note (Signed)
Transition of Care [facility]) - Progression Note    Patient Details  Name: Marissa Gilmore MRN: [id number] Date of Birth: 23-Jun-1931  [facility] CM/SW Contact  Truddie Hidden, RN Phone Number: 07/17/2023, 9:58 AM  Clinical Narrative:    Message left for Tiffany at [facility] regarding update on status of bed.    Expected Discharge Plan: Skilled Nursing Facility    Expected Discharge Plan and Services       Living arrangements for the past 2 months: Single Family Home                                       Social Determinants of Health (SDOH) Interventions SDOH Screenings   Food Insecurity: No Food Insecurity (07/09/2023)  Housing: Low Risk  (07/09/2023)  Transportation Needs: No Transportation Needs (07/09/2023)  Utilities: Not At Risk (07/09/2023)  Alcohol Screen: Low Risk  (09/16/2022)  Depression (PHQ2-9): Low Risk  (09/16/2022)  Financial Resource Strain: Low Risk  (09/16/2022)  Physical Activity: Insufficiently Active (09/16/2022)  Social Connections: Moderately Isolated (09/16/2022)  Stress: No Stress Concern Present (09/16/2022)  Tobacco Use: Low Risk  (07/09/2023)    Readmission Risk Interventions     No data to display

## 2023-07-17 NOTE — Progress Notes (Signed)
Palliative Care Progress Note, Assessment & Plan   Patient Name: Marissa Gilmore       Date: 07/17/2023 DOB: [birth year]  Age: 89 y.o. MRN#: [id number] Attending Physician: Alford Highland, MD Primary Care Physician: Erasmo Downer, MD Admit Date: 07/08/2023  Subjective: Patient is lying in bed in no apparent distress.  She is resting but easily awakens.  She acknowledges my presence and is able to make her wishes known.  RN at bedside as code stroke was called this morning.  Patient has no acute complaints at this time.  She endorses she slept well last night.  No family or friends present during my visit.  HPI: Marissa Gilmore is a 89 y.o. female with medical history significant for Chronic atrial fibrillation on Eliquis and amiodarone, systolic CHF, HTN and HLD who presented to the ED initially with a 2-day history of persistent vaginal bleeding including passage of golf ball size clots.  While in the emergency room, patient developed atrial fibrillation with RVR, then subsequently she developed left-sided facial droop and slurred speech. MRI brain showed subtle 1.5 cm focus of diffusion signal abnormality involving the right frontal corona radiata, suspicious for an acute ischemic infarct. Patient is seen by neurology, due to large amount of vaginal bleeding, anticoagulation was not able to be started. Patient continued to have vaginal bleeding while in the hospital, not able to start anticoagulation, and D&C was performed on 9/7 for vaginal bleeding.   Gyn consulted for continued vaginal bleeding and discussed uterine emoblization, to which patient is agreeable. CT abd/levis revealed DVT and acute small PE for which heparin gtt was initiated. Multiple units of RBCs given during hospitalization.    9/11 - Successful bilateral ovarian artery embolization and IVC filter placement.    PMT was consulted for GOC discussions.  Summary of counseling/coordination of care: After reviewing the patient's chart and assessing the patient at bedside, I spoke with patient in regards to symptom management and goals of care.  Symptoms assessed.  Patient endorses she slept well last night.  However, she says she is tired.  We discussed mental exhaustion and drain related to current hospitalization.  Therapeutic silence, active listening, and emotional support provided.  Discussed code stroke this morning.  Patient shares she does not feel as though her left-sided weakness has changed.  We discussed that providers were concerned and that a new stroke will be ruled out.  Education provided on CT angiogram, anticoagulation, and patient's increased risk of recurrent stroke.  Reviewed successful bilateral embolization and IVC filter placement yesterday.  Patient shares that she is feeling better today and is hopeful to continue to improve.  Awaiting results of CT angiogram and patient remains NPO.  She understands she is not to eat or drink anything until instructed.  However, I again highlighted the importance of patient's nutritional intake to sustain her.  She endorses she knows she needs to drink and eat more but that she just cannot right now.  Discussed that when cleared I would encourage patient to take teaspoons/mouthfuls consistently throughout the day as she easily tires with large meals.  Patient endorsed understanding.  PMT will continue to follow and support patient throughout her hospitalization.  Physical Exam Vitals reviewed.  Constitutional:      General: She is not in acute distress.    Appearance: She is normal weight.  HENT:     Head: Normocephalic.     Mouth/Throat:     Mouth: Mucous membranes are moist.     Comments: Left sided facial droop Pulmonary:     Effort: Pulmonary  effort is normal.  Abdominal:     Palpations: Abdomen is soft.  Musculoskeletal:     Comments: Left sided weakness  Skin:    General: Skin is warm and dry.  Neurological:     Mental Status: She is alert and oriented to person, place, and time.  Psychiatric:        Mood and Affect: Mood normal.        Behavior: Behavior normal.        Thought Content: Thought content normal.        Judgment: Judgment normal.             Total Time 35 minutes   Udell Mazzocco L. Bonita Quin, DNP, FNP-BC Palliative Medicine Team

## 2023-07-17 NOTE — Assessment & Plan Note (Addendum)
Creatinine peaked at 1.76 on 9/8.  Creatinine down to 1.36 today.

## 2023-07-17 NOTE — Significant Event (Signed)
Rapid Response Event Note   Reason for Call :   Code Stroke Initial Focused Assessment:   Dr. Renae Gloss and Dr. Selina Cooley at bedside.  Patient having left-sided weakness per Dr. Hilton Sinclair.  Patient vitals stable and answering questions but slow to respond/follows commands.    Interventions:  CT head with perfusion.  Plan of Care:  Transfer to ICU for Q2 neuro checks   Event Summary:   MD Notified:  Call Time: Arrival Time: End Time:  Vincente Poli, RN

## 2023-07-17 NOTE — Code Documentation (Signed)
Stroke Response Nurse Documentation Code Documentation  Marissa Gilmore is a 89 y.o. female admitted to [facility] on 07/09/2023 for vaginal bleeding. She developed a-fib RVR and subsequently developed left sided facial droop and slurred speech. MRI found to be positive for stroke. Underwent infrarenal IVC filter placement+ embolization of bilateral gonadal arteries 9/11. Past medical hx of HTN, cataract, a-fib, diastolic heart failure. On No antithrombotic. Code stroke was activated by neurology MD Selina Cooley.  Patient on 2A unit where she was LKW at unclear time. This morning primary RN Tod Persia noted patient to be tachycardic. MD Wieting to the bedside and noted patient to have worse left sided weakness. Neurology to bedside and code stroke activated. BP 133/110, CBG 144.   Stroke team at the bedside after patient activation. Patient to CT with team. NIHSS 9, see documentation for details and code stroke times. Patient with left facial droop, left arm weakness, left leg weakness, left decreased sensation, and dysarthria  on exam. The following imaging was completed:  CT Head, CTA, and CTP. Patient is not a candidate for IV Thrombolytic due to recent stroke, per MD. Patient is not a candidate for IR due to no acute LVO on imaging, per MD.   Care/Plan: NPO until swallow screen passed, SLP eval if failure of bedside swallow screen, every 2 hour NIHSS.   Bedside handoff with RN Tod Persia 2A, [facility] ICU/SD charge.    Wille Glaser  Stroke Response RN

## 2023-07-17 NOTE — Progress Notes (Signed)
Referring Physician(s): Alford Highland, MD   Supervising Physician: Roanna Banning  Patient Status:  [facility] - In-pt  Reason for visit: Vaginal bleeding- unclear etiology leiomyomatous fibroid versus endometrial neoplasm and pulmonary embolus with active bleeding and inability to tolerate anticoagulation.  Subjective: Per floor RN patient's vaginal bleeding has improved significantly since the procedure, she does have some residual bleeding but feels it is less and darker in color. RN stated right groin access had some slight oozing but after dressing change this has improved and dressing has stayed clean and dry and area has been soft without concern for hematoma.   Allergies: Patient has no known allergies.  Medications: Prior to Admission medications   Medication Sig Start Date End Date Taking? Authorizing Provider  amiodarone (PACERONE) 200 MG tablet Take 200 mg by mouth as directed. Take 1 tablet (200 mg total) by mouth as directed Take 1 tablet twice daily for 2 weeks. Thereafter take 1 tablet daily. 06/24/23  Yes [provider]  atorvastatin (LIPITOR) 40 MG tablet Take 1 tablet (40 mg total) by mouth daily. 02/06/23  Yes Bacigalupo, Marzella Schlein, MD  ELIQUIS 2.5 MG TABS tablet SMARTSIG:1 Tablet(s) By Mouth Every 12 Hours   Yes [provider]  metoprolol tartrate (LOPRESSOR) 25 MG tablet Take 25 mg by mouth 2 (two) times daily.  04/01/19  Yes [provider]  potassium chloride (KLOR-CON) 20 MEQ packet Take 20 mEq by mouth daily. 03/30/21  Yes [provider]  sacubitril-valsartan (ENTRESTO) 24-26 MG Take 1 tablet by mouth 2 (two) times daily.  05/25/19  Yes [provider]  spironolactone (ALDACTONE) 25 MG tablet Take 12.5 mg by mouth daily.   Yes [provider]  torsemide (DEMADEX) 20 MG tablet Take 20 mg by mouth 2 (two) times daily. 06/01/21  Yes [provider]  furosemide (LASIX) 40 MG tablet TAKE 1 TABLET BY MOUTH EVERY  DAY Patient not taking: Reported on 07/09/2023 12/04/20   Erasmo Downer, MD   Vital Signs: BP (!) 150/61   Pulse 62   Temp 98.4 F (36.9 C) (Axillary)   Resp (!) 21   Ht 5\' 4"  (1.626 m)   Wt 136 lb 3.9 oz (61.8 kg)   SpO2 99%   BMI 23.39 kg/m   Physical Exam  General: Awake, just finished having MRI, NAD Abd: Soft, ND Ext: RCFA/RCFV dressing intact C/D with no signs of active bleeding, no hematoma, no signs of infection, medial area to access site with soft light blue bruising- area marked and has not extended past marking.  Imaging: IR EMBO ART  VEN HEMORR LYMPH EXTRAV  INC GUIDE ROADMAPPING  Result Date: 07/17/2023 INDICATION: Vaginal bleeding requiring transfusion, leiomyomatous fibroid versus endometrial neoplasm; recent diagnosis of PE/DVT with contraindication for systemic anticoagulation EXAM: IVC filter placement: 1. Ultrasound-guided puncture of the right common femoral vein 2. Catheterization and angiography of the IVC 3. Placement of IVC filter using fluoroscopic guidance Embolization of bilateral ovarian (gonadal) arteries 1. Ultrasound-guided puncture of the right common femoral artery 2. Catheterization of the aorta and pelvic angiogram 3. Catheterization and angiography of the left ovarian artery 4. Embolization of the left ovarian artery using beads and coils 5. Catheterization and angiography of the right ovarian artery 6. Embolization of the right ovarian artery using beads and coils MEDICATIONS: Documented in the EMR ANESTHESIA/SEDATION: Moderate (conscious) sedation was employed during this procedure. A total of Versed 0.5 mg and Fentanyl 12.5 mcg was administered intravenously by the radiology nurse.  Total intra-service moderate Sedation Time: 105 minutes. The patient's level of consciousness and vital signs were monitored continuously by radiology nursing throughout the procedure under my direct supervision. CONTRAST:  110 mL Omnipaque 300 FLUOROSCOPY: Radiation  Exposure Index (as provided by the fluoroscopic device): 25.3 minutes (296 mGy) COMPLICATIONS: None immediate. PROCEDURE: Informed consent was obtained from the patient following explanation of the procedure, risks, benefits and alternatives. The patient understands, agrees and consents for the procedure. All questions were addressed. A time out was performed prior to the initiation of the procedure. Maximal barrier sterile technique utilized including caps, mask, sterile gowns, sterile gloves, large sterile drape, hand hygiene, and Betadine prep. The right groin and draped in the standard sterile fashion. Ultrasound was used to evaluate the right common femoral vein, which was found to be widely patent. Under ultrasound guidance, the right common femoral vein was directly punctured using a 21 gauge micropuncture set. Wire was advanced centrally into the IVC, and the tract was serially dilated to accommodate the introducer sheath for the IVC filter. The introducer sheath of a Denali IVC filter with its flush inner dilator was then advanced over the wire to the iliocaval confluence. Angiography of the inferior vena cava was then performed. This demonstrates a normal caliber IVC which is widely patent without thrombus. Position of the inferior-most renal veins was noted. Under careful fluoroscopic guidance, a Denali IVC filter was deployed in an infrarenal location. Proper deployment was confirmed with radiography. After deployment of the IVC filter, attention was turned to embolization of the uterus. Ultrasound was used to evaluate the right common femoral artery, which found to be patent and suitable for access. An ultrasound image was permanently stored in the electronic medical record. Using ultrasound guidance, the right common femoral artery was directly punctured using a 21 gauge micropuncture set. An 018 wire was advanced centrally, followed by serial tract dilation and placement of a 5 French sheath. Using  combination of a Bentson guidewire in pigtail catheter, the inferior abdominal aorta was catheterized, and pelvic angiogram performed in the AP projection. This demonstrated patent iliac vessels. No significant uterine artery vessels are identified, consistent with recent CT angiography findings. Therefore, the decision was made to proceed with embolization of the bilateral ovarian arteries, as these would appear to be the predominant supply to the uterus. Using a 5 [place] Mickelson catheter, the left ovarian artery was successfully catheterized. Angiography demonstrated a hypertrophied and tortuous ovarian artery that supplies the uterus. Using a combination of a 2.4 [place] Progreat Omega microcatheter and fathom 16 microwire, microcatheter was advanced to the mid ovarian artery. Embolization was then performed using combination of 500-700 and 700-900 um embospheres, followed by both 4 mm and packing Ruby coils. Post embolization angiography demonstrated near-complete cessation of flow to the left ovarian artery. Again using a 5 [place] Mickelson catheter, the right ovarian artery was then successfully catheterized. Angiography again demonstrated a hypertrophied and tortuous ovarian artery supplying the uterus. Using a combination of a 2.4 [place] Progreat Omega microcatheter and fathom 16 microwire, microcatheter was advanced to the mid ovarian artery. Embolization was then performed using combination of 500-700 and 700-900 um embospheres, followed by both 4 mm and packing Ruby coils. Post embolization angiography demonstrated near-complete cessation of flow to the right ovarian artery. At the end the procedure, all catheters and wires were removed. Hemostasis was achieved at the arterial access site using the Angio-Seal vascular closure device. Hemostasis was achieved at the venous access site using manual pressure. Sterile  dressings was placed. The patient tolerated the procedure well without immediate  complication. IMPRESSION: 1. Successful embolization of the bilateral ovarian arteries using the embospheres and coils. 2. Successful placement of an infrarenal IVC filter. Note that this filter is potentially retrievable if the risk of thromboembolism decreases, or the patient becomes a candidate to resume oral anticoagulation. Electronically Signed   By: Olive Bass M.D.   On: 07/17/2023 10:19   IR IVC FILTER PLMT / S&I Lenise Arena GUID/MOD SED  Result Date: 07/17/2023 INDICATION: Vaginal bleeding requiring transfusion, leiomyomatous fibroid versus endometrial neoplasm; recent diagnosis of PE/DVT with contraindication for systemic anticoagulation EXAM: IVC filter placement: 1. Ultrasound-guided puncture of the right common femoral vein 2. Catheterization and angiography of the IVC 3. Placement of IVC filter using fluoroscopic guidance Embolization of bilateral ovarian (gonadal) arteries 1. Ultrasound-guided puncture of the right common femoral artery 2. Catheterization of the aorta and pelvic angiogram 3. Catheterization and angiography of the left ovarian artery 4. Embolization of the left ovarian artery using beads and coils 5. Catheterization and angiography of the right ovarian artery 6. Embolization of the right ovarian artery using beads and coils MEDICATIONS: Documented in the EMR ANESTHESIA/SEDATION: Moderate (conscious) sedation was employed during this procedure. A total of Versed 0.5 mg and Fentanyl 12.5 mcg was administered intravenously by the radiology nurse. Total intra-service moderate Sedation Time: 105 minutes. The patient's level of consciousness and vital signs were monitored continuously by radiology nursing throughout the procedure under my direct supervision. CONTRAST:  110 mL Omnipaque 300 FLUOROSCOPY: Radiation Exposure Index (as provided by the fluoroscopic device): 25.3 minutes (296 mGy) COMPLICATIONS: None immediate. PROCEDURE: Informed consent was obtained from the patient following  explanation of the procedure, risks, benefits and alternatives. The patient understands, agrees and consents for the procedure. All questions were addressed. A time out was performed prior to the initiation of the procedure. Maximal barrier sterile technique utilized including caps, mask, sterile gowns, sterile gloves, large sterile drape, hand hygiene, and Betadine prep. The right groin and draped in the standard sterile fashion. Ultrasound was used to evaluate the right common femoral vein, which was found to be widely patent. Under ultrasound guidance, the right common femoral vein was directly punctured using a 21 gauge micropuncture set. Wire was advanced centrally into the IVC, and the tract was serially dilated to accommodate the introducer sheath for the IVC filter. The introducer sheath of a Denali IVC filter with its flush inner dilator was then advanced over the wire to the iliocaval confluence. Angiography of the inferior vena cava was then performed. This demonstrates a normal caliber IVC which is widely patent without thrombus. Position of the inferior-most renal veins was noted. Under careful fluoroscopic guidance, a Denali IVC filter was deployed in an infrarenal location. Proper deployment was confirmed with radiography. After deployment of the IVC filter, attention was turned to embolization of the uterus. Ultrasound was used to evaluate the right common femoral artery, which found to be patent and suitable for access. An ultrasound image was permanently stored in the electronic medical record. Using ultrasound guidance, the right common femoral artery was directly punctured using a 21 gauge micropuncture set. An 018 wire was advanced centrally, followed by serial tract dilation and placement of a 5 French sheath. Using combination of a Bentson guidewire in pigtail catheter, the inferior abdominal aorta was catheterized, and pelvic angiogram performed in the AP projection. This demonstrated patent  iliac vessels. No significant uterine artery vessels are identified, consistent with recent CT angiography  findings. Therefore, the decision was made to proceed with embolization of the bilateral ovarian arteries, as these would appear to be the predominant supply to the uterus. Using a 5 [place] Mickelson catheter, the left ovarian artery was successfully catheterized. Angiography demonstrated a hypertrophied and tortuous ovarian artery that supplies the uterus. Using a combination of a 2.4 [place] Progreat Omega microcatheter and fathom 16 microwire, microcatheter was advanced to the mid ovarian artery. Embolization was then performed using combination of 500-700 and 700-900 um embospheres, followed by both 4 mm and packing Ruby coils. Post embolization angiography demonstrated near-complete cessation of flow to the left ovarian artery. Again using a 5 [place] Mickelson catheter, the right ovarian artery was then successfully catheterized. Angiography again demonstrated a hypertrophied and tortuous ovarian artery supplying the uterus. Using a combination of a 2.4 [place] Progreat Omega microcatheter and fathom 16 microwire, microcatheter was advanced to the mid ovarian artery. Embolization was then performed using combination of 500-700 and 700-900 um embospheres, followed by both 4 mm and packing Ruby coils. Post embolization angiography demonstrated near-complete cessation of flow to the right ovarian artery. At the end the procedure, all catheters and wires were removed. Hemostasis was achieved at the arterial access site using the Angio-Seal vascular closure device. Hemostasis was achieved at the venous access site using manual pressure. Sterile dressings was placed. The patient tolerated the procedure well without immediate complication. IMPRESSION: 1. Successful embolization of the bilateral ovarian arteries using the embospheres and coils. 2. Successful placement of an infrarenal IVC filter. Note that this  filter is potentially retrievable if the risk of thromboembolism decreases, or the patient becomes a candidate to resume oral anticoagulation. Electronically Signed   By: Olive Bass M.D.   On: 07/17/2023 10:19   CT ANGIO HEAD NECK W WO CM W PERF (CODE STROKE)  Result Date: 07/17/2023 CLINICAL DATA:  Neuro deficit, acute, stroke suspected L sided weakness. EXAM: CT ANGIOGRAPHY HEAD AND NECK CT PERFUSION BRAIN TECHNIQUE: Multidetector CT imaging of the head and neck was performed using the standard protocol during bolus administration of intravenous contrast. Multiplanar CT image reconstructions and MIPs were obtained to evaluate the vascular anatomy. Carotid stenosis measurements (when applicable) are obtained utilizing NASCET criteria, using the distal internal carotid diameter as the denominator. Multiphase CT imaging of the brain was performed following IV bolus contrast injection. Subsequent parametric perfusion maps were calculated using RAPID software. RADIATION DOSE REDUCTION: This exam was performed according to the departmental dose-optimization program which includes automated exposure control, adjustment of the mA and/or kV according to patient size and/or use of iterative reconstruction technique. CONTRAST:  OMNIPAQUE IOHEXOL 350 MG/ML SOLN COMPARISON:  MRI/MRA head 07/09/2023. FINDINGS: CTA NECK FINDINGS Aortic arch: Three-vessel arch configuration. Atherosclerotic calcifications of the aortic arch and arch vessel origins. Arch vessel origins are patent. Right carotid system: No evidence of dissection, stenosis (50% or greater) or occlusion. Mild calcified plaque along the right carotid bulb. Left carotid system: Mixed plaque results in severe stenosis of the proximal left cervical ICA (difficult to measure due to blooming artifact and severity of stenosis). Vertebral arteries: Codominant. No evidence of dissection, stenosis (50% or greater) or occlusion. Skeleton: Unremarkable. Other neck:  Unremarkable. Upper chest: Small left pleural effusion. Review of the MIP images confirms the above findings CTA HEAD FINDINGS Anterior circulation: Calcified plaque along the carotid siphons without hemodynamically significant stenosis. 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 Unchanged moderate stenosis of the right  MCA proximal inferior M2 division. The left MCA and bilateral ACAs are patent proximally without stenosis or aneurysm. Mild paucity of distal right MCA branches along the right frontal operculum. Posterior circulation: Mild luminal irregularity of the basilar artery, likely atherosclerotic in etiology. The SCAs, AICAs and PICAs are patent proximally. Unchanged moderate stenosis of the bilateral PCA origins. Unchanged severe stenosis of the right PCA P2 segment. Slight asymmetric paucity of distal right PCA branches. Venous sinuses: As permitted by contrast timing, patent. Anatomic variants: None. Review of the MIP images confirms the above findings CT Brain Perfusion Findings: CBF (<30%) Volume: 0mL Perfusion (Tmax>6.0s) volume: 0mL (post processing identified 9 mm ischemic penumbra in the right frontal operculum, which corresponds to the subacute infarct seen on same day noncontrast head CT). Mismatch Volume: 0mL Infarction Location:Not applicable. IMPRESSION: 1. 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 2. Unchanged moderate stenosis of the right MCA proximal inferior M2 division. 3. Unchanged severe stenosis of the right PCA P2 segment. 4. Severe stenosis of the proximal left cervical ICA. 5. No core infarct on CT perfusion. Automated post processing identified 9 mm ischemic penumbra in the right frontal operculum, which corresponds to the subacute infarct seen on same day noncontrast head CT. Aortic Atherosclerosis (ICD10-I70.0). Code stroke imaging results were communicated on 07/17/2023 at 9:36 am to provider Dr. Selina Cooley  via secure text paging. Electronically Signed   By: Orvan Falconer M.D.   On: 07/17/2023 09:42   CT HEAD CODE STROKE WO CONTRAST`  Result Date: 07/17/2023 CLINICAL DATA:  Code stroke. Neuro deficit, acute, stroke suspected L sided weakness. EXAM: CT HEAD WITHOUT CONTRAST TECHNIQUE: Contiguous axial images were obtained from the base of the skull through the vertex without intravenous contrast. RADIATION DOSE REDUCTION: This exam was performed according to the departmental dose-optimization program which includes automated exposure control, adjustment of the mA and/or kV according to patient size and/or use of iterative reconstruction technique. COMPARISON:  Head CT 07/08/2023.  MRI/MRA brain 07/09/2023. FINDINGS: Brain: No acute hemorrhage. 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 No new loss of gray-Bober differentiation. No hydrocephalus or extra-axial collection. No mass effect or midline shift. Vascular: No hyperdense vessel or unexpected calcification. Skull: No calvarial fracture or suspicious bone lesion. Skull base is unremarkable. Sinuses/Orbits: No acute finding. Other: None. ASPECTS [facility] Stroke Program Early CT Score) - Ganglionic level infarction (caudate, lentiform nuclei, internal capsule, insula, M1-M3 cortex): 7 - Supraganglionic infarction (M4-M6 cortex): 3 Total score (0-10 with 10 being normal): 10 IMPRESSION: 1. No acute intracranial hemorrhage or evidence of new acute large vessel territory infarct. ASPECT score is 10. 2. 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 Code stroke imaging results were communicated on 07/17/2023 at 8:51 am to provider Dr. Selina Cooley via secure text paging. Electronically Signed   By: Orvan Falconer M.D.   On: 07/17/2023 08:53   CT Angio Abd/Pel w/ and/or w/o  Result Date: 07/15/2023 CLINICAL DATA:  Postmenopausal bleeding EXAM: CTA ABDOMEN AND PELVIS WITHOUT AND WITH CONTRAST TECHNIQUE: Multidetector CT  imaging of the abdomen and pelvis was performed using the standard protocol during bolus administration of intravenous contrast. Multiplanar reconstructed images and MIPs were obtained and reviewed to evaluate the vascular anatomy. RADIATION DOSE REDUCTION: This exam was performed according to the departmental dose-optimization program which includes automated exposure control, adjustment of the mA and/or kV according to patient size and/or use of iterative reconstruction technique. CONTRAST:  75mL OMNIPAQUE IOHEXOL 350 MG/ML  SOLN COMPARISON:  None Available. FINDINGS: VASCULAR Aorta: No aneurysm or dissection. Extensive atherosclerotic calcification. Celiac: Patent without evidence of aneurysm, dissection, vasculitis or significant stenosis. SMA: Patent without evidence of aneurysm, dissection, vasculitis or significant stenosis. Renals: Both renal arteries are patent without evidence of aneurysm, dissection, vasculitis, fibromuscular dysplasia or significant stenosis. IMA: Patent without evidence of aneurysm, dissection, vasculitis or significant stenosis. Inflow: Patent without evidence of aneurysm, dissection, vasculitis or significant stenosis. Proximal Outflow: Bilateral common femoral and visualized portions of the superficial and profunda femoral arteries are patent without evidence of aneurysm, dissection, vasculitis or significant stenosis. Veins: Filling defect within the left common femoral vein at axial image # 68/15 is in keeping with a focal deep venous thrombus. A filling defect is also incidentally noted within the posterior segmental pulmonary artery of the right lower lobe the superior margin of the examination (image # 1/12) in keeping with an acute pulmonary embolus. There is marked mass effect upon the right external iliac vein by the enlarged uterus, best seen on image # 859/15. Review of the MIP images confirms the above findings. NON-VASCULAR Lower chest: As noted above, a acute pulmonary  embolus partially visualized within the right lower lobe. Cardiac size is mildly enlarged. Hepatobiliary: No focal liver abnormality is seen. No gallstones, gallbladder wall thickening, or biliary dilatation. Pancreas: Unremarkable Spleen: Unremarkable Adrenals/Urinary Tract: The adrenal glands are unremarkable. The kidneys are normal. Foley catheter balloon is seen looped within the bladder lumen which is partially decompressed. Stomach/Bowel: Stomach is within normal limits. Appendix appears normal. No evidence of bowel wall thickening, distention, or inflammatory changes. Lymphatic: No pathologic adenopathy within the abdomen and pelvis. Reproductive: The uterus is enlarged and lobulated with multiple heterogeneously hypoenhancing masses likely representing multiple uterine fibroids. The endometrial cavity appears distended and there is heterogeneous intraluminal contents, best seen on sagittal image # 82/11 which may represent blood product or soft tissue as could be seen endometrial carcinoma. This hyperdense material appears to extend into the endocervical canal, but is not well assessed on this examination. No adnexal masses are seen. Other: Tiny fat containing umbilical hernia. Musculoskeletal: No acute bone abnormality. No lytic or blastic bone lesion. IMPRESSION: 1. Acute pulmonary embolus within the posterior segmental pulmonary artery of the right lower lobe. 2. Focal deep venous thrombus within the left common femoral vein. 3. Marked mass effect upon the right external iliac vein by the enlarged uterus. 4. Enlarged, lobulated uterus with multiple heterogeneously hypoenhancing masses likely representing multiple uterine fibroids. 5. Distended endometrial cavity with heterogeneous intraluminal contents which may represent blood product or soft tissue as could be seen endometrial carcinoma. This hyperdense material appears to extend into the endocervical canal, but is not well assessed on this examination.  Correlation with dedicated pelvic sonography may be helpful for further evaluation. Aortic Atherosclerosis (ICD10-I70.0). Electronically Signed   By: Helyn Numbers M.D.   On: 07/15/2023 20:12    Labs:  CBC: Recent Labs    07/11/23 0630 07/11/23 1811 07/12/23 0437 07/12/23 1634 07/15/23 0441 07/15/23 1650 07/16/23 0451 07/17/23 0533  WBC 9.1  --  11.9*  --   --   --  17.2* 18.8*  HGB 6.5* 8.9* 8.4*   < > 6.9* 8.9* 7.9* 9.7*  HCT 19.2* 26.3* 24.3*  --   --   --  24.7* 29.2*  PLT 134*  --  142*  --   --   --  86* 86*   < > = values in this interval not displayed.  COAGS: Recent Labs    07/09/23 0406 07/09/23 0802  INR TEST WILL BE CREDITED 1.2  APTT TEST WILL BE CREDITED 28    BMP: Recent Labs    07/14/23 0816 07/15/23 0441 07/16/23 0451 07/17/23 0533  NA 146* 140 139 137  K 4.2 4.2 4.4 4.7  CL 117* 115* 114* 109  CO2 22 22 22  19*  GLUCOSE 143* 134* 115* 122*  BUN 38* 35* 29* 29*  CALCIUM 9.6 9.1 8.6* 9.0  CREATININE 1.63* 1.41* 1.29* 1.26*  GFRNONAA 30* 35* 39* 41*    LIVER FUNCTION TESTS: Recent Labs    02/06/23 1348  BILITOT 0.7  AST 15  ALT 19  ALKPHOS 108  PROT 6.5  ALBUMIN 4.1    Assessment and Plan: 89 year old female with PMHx of afib on Eliquis, CHF, HTN who presented to ED with acute blood loss anemia secondary to vaginal bleeding-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 IR was consulted for intervention. Patient also found to have acute PE and request for IVC filter received. She is being worked up for a stroke as well given left sided facial droop and slurred speech.  She is s/p successful IVC filter placement via R[facility]V and bilateral ovarian artery embolization with beads and coils via RCFA 9/11.   Hgb has improved 9.7 (7.9)- last transfusion 9/11, VSS and per floor RN her bleeding clinically has improved, right groin access site today without complication- no signs of bleeding or hematoma. Continue to follow H/H  closely and call IR if needed.    Electronically Signed: Berneta Levins, PA-C 07/17/2023, 3:56 PM   I spent a total of 15 Minutes at the the patient's bedside AND on the patient's hospital floor or unit, greater than 50% of which was counseling/coordinating care for vaginal bleeding and pulmonary embolus with active bleeding and inability to tolerate anticoagulation.

## 2023-07-17 NOTE — Progress Notes (Signed)
OT Cancellation Note  Patient Details Name: Marissa Gilmore MRN: [id number] DOB: 1931/01/20   Cancelled Treatment:    Reason Eval/Treat Not Completed: Medical issues which prohibited therapy (Noted code stroke called this AM. OT will hold at this time until pt medically stable and appropriate for therapy.)  [name] L. Kamaury Cutbirth, OTR/L  07/17/23, 1:02 PM

## 2023-07-17 NOTE — Consult Note (Signed)
[facility] CLINIC CARDIOLOGY CONSULT NOTE       Patient ID: Marissa Gilmore MRN: [id number] DOB/AGE: [birth year] 89 y.o.  Admit date: 07/08/2023 Referring Physician Dr. Renae Gloss Primary Physician Dr. Beryle Flock Primary Cardiologist Dr. Juliann Pares Reason for Consultation Atrial fibrillation RVR  HPI: Marissa Gilmore is a 89 y.o. female  with a past medical history of paroxysmal atrial fibrillation, chronic HFrEF (EF 55-60% on echo this admission), NICM, peripheral vascular disease, hypertension, hyperlipidemia,  who presented to the ED on 07/08/2023 for vaginal bleeding. 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 During her hospitalization she has been treated for anemia and has received 6 units PRBCs. Has been intermittently in atrial fibrillation, she has a history of paroxysmal afib. Rates have been elevated at times. Cardiology was consulted for further evaluation and management of AF RVR.   Patient reports that she is feeling okay overall this morning.  Did have worsening left-sided weakness earlier this a.m. and code stroke was activated.  CT scan showed evolving subacute infarct in the right frontal operculum.  At the time of my evaluation her weakness is overall stable no significant left-sided facial droop or slurring of words.  She reports that her throat is dry.  She denies any chest pain, palpitations, shortness of breath.  States that she cannot feel when her heart rate is elevated. Otherwise she has no complaints.  Review of systems complete and found to be negative unless listed above    Past Medical History:  Diagnosis Date   Atrial fibrillation (HCC)    Cataract    Diastolic heart failure (HCC)    35% EF   Hypertension     Past Surgical History:  Procedure Laterality Date   CATARACT EXTRACTION Right    DILATION AND CURETTAGE OF UTERUS N/A 07/12/2023   Procedure: DILATATION AND CURETTAGE;  Surgeon: Schermerhorn,  Ihor Austin, MD;  Location: [facility] ORS;  Service: Gynecology;  Laterality: N/A;   IR EMBO ART  VEN HEMORR LYMPH EXTRAV  INC GUIDE ROADMAPPING  07/16/2023   IR IVC FILTER PLMT / S&I /IMG GUID/MOD SED  07/16/2023    Medications Prior to Admission  Medication Sig Dispense Refill Last Dose   amiodarone (PACERONE) 200 MG tablet Take 200 mg by mouth as directed. Take 1 tablet (200 mg total) by mouth as directed Take 1 tablet twice daily for 2 weeks. Thereafter take 1 tablet daily.   07/08/2023 at 1000   atorvastatin (LIPITOR) 40 MG tablet Take 1 tablet (40 mg total) by mouth daily. 90 tablet 3 07/08/2023   ELIQUIS 2.5 MG TABS tablet SMARTSIG:1 Tablet(s) By Mouth Every 12 Hours   07/08/2023 at 1000   metoprolol tartrate (LOPRESSOR) 25 MG tablet Take 25 mg by mouth 2 (two) times daily.    07/08/2023 at 1000   potassium chloride (KLOR-CON) 20 MEQ packet Take 20 mEq by mouth daily.   07/08/2023 at 1000   sacubitril-valsartan (ENTRESTO) 24-26 MG Take 1 tablet by mouth 2 (two) times daily.    07/08/2023 at 1000   spironolactone (ALDACTONE) 25 MG tablet Take 12.5 mg by mouth daily.   07/08/2023 at 1000   torsemide (DEMADEX) 20 MG tablet Take 20 mg by mouth 2 (two) times daily.   07/08/2023 at 1000   furosemide (LASIX) 40 MG tablet TAKE 1 TABLET BY MOUTH EVERY DAY (Patient not taking: Reported on 07/09/2023) 90 tablet 1 Not Taking   Social History   Socioeconomic  History   Marital status: Single    Spouse name: Not on file   Number of children: 0   Years of education: Not on file   Highest education level: 12th grade  Occupational History   Occupation: retired    Comment: Manufacturing engineer  Tobacco Use   Smoking status: Never   Smokeless tobacco: Never  Vaping Use   Vaping status: Never Used  Substance and Sexual Activity   Alcohol use: No   Drug use: No   Sexual activity: Not on file  Other Topics Concern   Not on file  Social History Narrative   Not on file   Social Determinants of Health   Financial  Resource Strain: Low Risk  (09/16/2022)   Overall Financial Resource Strain (CARDIA)    Difficulty of Paying Living Expenses: Not hard at all  Food Insecurity: No Food Insecurity (07/09/2023)   Hunger Vital Sign    Worried About Running Out of Food in the Last Year: Never true    Ran Out of Food in the Last Year: Never true  Transportation Needs: No Transportation Needs (07/09/2023)   PRAPARE - Administrator, Civil Service (Medical): No    Lack of Transportation (Non-Medical): No  Physical Activity: Insufficiently Active (09/16/2022)   Exercise Vital Sign    Days of Exercise per Week: 3 days    Minutes of Exercise per Session: 30 min  Stress: No Stress Concern Present (09/16/2022)   [employer] of Occupational Health - Occupational Stress Questionnaire    Feeling of Stress : Not at all  Social Connections: Moderately Isolated (09/16/2022)   Social Connection and Isolation Panel [NHANES]    Frequency of Communication with Friends and Family: Twice a week    Frequency of Social Gatherings with Friends and Family: Twice a week    Attends Religious Services: More than 4 times per year    Active Member of [employer] or Organizations: No    Attends Banker Meetings: Never    Marital Status: Never married  Intimate Partner Violence: Not At Risk (07/09/2023)   Humiliation, Afraid, Rape, and Kick questionnaire    Fear of Current or Ex-Partner: No    Emotionally Abused: No    Physically Abused: No    Sexually Abused: No    Family History  Problem Relation Age of Onset   Heart disease Mother        no MI   Peripheral Artery Disease Mother 79   Bladder Cancer Father 14     Vitals:   07/17/23 0543 07/17/23 0555 07/17/23 0643 07/17/23 0733  BP: 136/63   139/79  Pulse: 65   97  Resp: 20 (!) 22 (!) 22 (!) 22  Temp: 98 F (36.7 C)   97.8 F (36.6 C)  TempSrc: Oral   Oral  SpO2: 99%   98%  Weight:      Height:        PHYSICAL EXAM General: Ill appearing, well  nourished, in no acute distress sitting upright in hospital bed with family present at bedside. HEENT: Normocephalic and atraumatic. Neck: No JVD.  Lungs: Normal respiratory effort on room air. Clear bilaterally to auscultation. No wheezes, crackles, rhonchi.  Heart: Irregularly irregular. Normal S1 and S2 without gallops or murmurs.  Abdomen: Non-distended appearing.  Msk: Normal strength and tone for age. Extremities: Warm and well perfused. No clubbing, cyanosis. No edema.  Neuro: Alert and oriented X 3. Psych: Answers questions appropriately.   Labs:  Basic Metabolic Panel: Recent Labs    07/15/23 0441 07/16/23 0451 07/17/23 0533  NA 140 139 137  K 4.2 4.4 4.7  CL 115* 114* 109  CO2 22 22 19*  GLUCOSE 134* 115* 122*  BUN 35* 29* 29*  CREATININE 1.41* 1.29* 1.26*  CALCIUM 9.1 8.6* 9.0  MG 2.8* 2.8*  --   PHOS 2.4* 2.5  --    Liver Function Tests: No results for input(s): "AST", "ALT", "ALKPHOS", "BILITOT", "PROT", "ALBUMIN" in the last 72 hours. No results for input(s): "LIPASE", "AMYLASE" in the last 72 hours. CBC: Recent Labs    07/16/23 0451 07/17/23 0533  WBC 17.2* 18.8*  HGB 7.9* 9.7*  HCT 24.7* 29.2*  MCV 96.9 92.4  PLT 86* 86*   Cardiac Enzymes: No results for input(s): "CKTOTAL", "CKMB", "CKMBINDEX", "TROPONINIHS" in the last 72 hours. BNP: No results for input(s): "BNP" in the last 72 hours. D-Dimer: No results for input(s): "DDIMER" in the last 72 hours. Hemoglobin A1C: No results for input(s): "HGBA1C" in the last 72 hours. Fasting Lipid Panel: No results for input(s): "CHOL", "HDL", "LDLCALC", "TRIG", "CHOLHDL", "LDLDIRECT" in the last 72 hours. Thyroid Function Tests: No results for input(s): "TSH", "T4TOTAL", "T3FREE", "THYROIDAB" in the last 72 hours.  Invalid input(s): "FREET3" Anemia Panel: No results for input(s): "VITAMINB12", "FOLATE", "FERRITIN", "TIBC", "IRON", "RETICCTPCT" in the last 72 hours.   Radiology: IR EMBO ART  VEN HEMORR  LYMPH EXTRAV  INC GUIDE ROADMAPPING  Result Date: 07/17/2023 INDICATION: Vaginal bleeding requiring transfusion, leiomyomatous fibroid versus endometrial neoplasm; recent diagnosis of PE/DVT with contraindication for systemic anticoagulation EXAM: IVC filter placement: 1. Ultrasound-guided puncture of the right common femoral vein 2. Catheterization and angiography of the IVC 3. Placement of IVC filter using fluoroscopic guidance Embolization of bilateral ovarian (gonadal) arteries 1. Ultrasound-guided puncture of the right common femoral artery 2. Catheterization of the aorta and pelvic angiogram 3. Catheterization and angiography of the left ovarian artery 4. Embolization of the left ovarian artery using beads and coils 5. Catheterization and angiography of the right ovarian artery 6. Embolization of the right ovarian artery using beads and coils MEDICATIONS: Documented in the EMR ANESTHESIA/SEDATION: Moderate (conscious) sedation was employed during this procedure. A total of Versed 0.5 mg and Fentanyl 12.5 mcg was administered intravenously by the radiology nurse. Total intra-service moderate Sedation Time: 105 minutes. The patient's level of consciousness and vital signs were monitored continuously by radiology nursing throughout the procedure under my direct supervision. CONTRAST:  110 mL Omnipaque 300 FLUOROSCOPY: Radiation Exposure Index (as provided by the fluoroscopic device): 25.3 minutes (296 mGy) COMPLICATIONS: None immediate. PROCEDURE: Informed consent was obtained from the patient following explanation of the procedure, risks, benefits and alternatives. The patient understands, agrees and consents for the procedure. All questions were addressed. A time out was performed prior to the initiation of the procedure. Maximal barrier sterile technique utilized including caps, mask, sterile gowns, sterile gloves, large sterile drape, hand hygiene, and Betadine prep. The right groin and draped in the  standard sterile fashion. Ultrasound was used to evaluate the right common femoral vein, which was found to be widely patent. Under ultrasound guidance, the right common femoral vein was directly punctured using a 21 gauge micropuncture set. Wire was advanced centrally into the IVC, and the tract was serially dilated to accommodate the introducer sheath for the IVC filter. The introducer sheath of a Denali IVC filter with its flush inner dilator was then advanced over the wire to the iliocaval confluence. Angiography  of the inferior vena cava was then performed. This demonstrates a normal caliber IVC which is widely patent without thrombus. Position of the inferior-most renal veins was noted. Under careful fluoroscopic guidance, a Denali IVC filter was deployed in an infrarenal location. Proper deployment was confirmed with radiography. After deployment of the IVC filter, attention was turned to embolization of the uterus. Ultrasound was used to evaluate the right common femoral artery, which found to be patent and suitable for access. An ultrasound image was permanently stored in the electronic medical record. Using ultrasound guidance, the right common femoral artery was directly punctured using a 21 gauge micropuncture set. An 018 wire was advanced centrally, followed by serial tract dilation and placement of a 5 French sheath. Using combination of a Bentson guidewire in pigtail catheter, the inferior abdominal aorta was catheterized, and pelvic angiogram performed in the AP projection. This demonstrated patent iliac vessels. No significant uterine artery vessels are identified, consistent with recent CT angiography findings. Therefore, the decision was made to proceed with embolization of the bilateral ovarian arteries, as these would appear to be the predominant supply to the uterus. Using a 5 [place] Mickelson catheter, the left ovarian artery was successfully catheterized. Angiography demonstrated a  hypertrophied and tortuous ovarian artery that supplies the uterus. Using a combination of a 2.4 [place] Progreat Omega microcatheter and fathom 16 microwire, microcatheter was advanced to the mid ovarian artery. Embolization was then performed using combination of 500-700 and 700-900 um embospheres, followed by both 4 mm and packing Ruby coils. Post embolization angiography demonstrated near-complete cessation of flow to the left ovarian artery. Again using a 5 [place] Mickelson catheter, the right ovarian artery was then successfully catheterized. Angiography again demonstrated a hypertrophied and tortuous ovarian artery supplying the uterus. Using a combination of a 2.4 [place] Progreat Omega microcatheter and fathom 16 microwire, microcatheter was advanced to the mid ovarian artery. Embolization was then performed using combination of 500-700 and 700-900 um embospheres, followed by both 4 mm and packing Ruby coils. Post embolization angiography demonstrated near-complete cessation of flow to the right ovarian artery. At the end the procedure, all catheters and wires were removed. Hemostasis was achieved at the arterial access site using the Angio-Seal vascular closure device. Hemostasis was achieved at the venous access site using manual pressure. Sterile dressings was placed. The patient tolerated the procedure well without immediate complication. IMPRESSION: 1. Successful embolization of the bilateral ovarian arteries using the embospheres and coils. 2. Successful placement of an infrarenal IVC filter. Note that this filter is potentially retrievable if the risk of thromboembolism decreases, or the patient becomes a candidate to resume oral anticoagulation. Electronically Signed   By: Olive Bass M.D.   On: 07/17/2023 10:19   IR IVC FILTER PLMT / S&I Lenise Arena GUID/MOD SED  Result Date: 07/17/2023 INDICATION: Vaginal bleeding requiring transfusion, leiomyomatous fibroid versus endometrial neoplasm; recent  diagnosis of PE/DVT with contraindication for systemic anticoagulation EXAM: IVC filter placement: 1. Ultrasound-guided puncture of the right common femoral vein 2. Catheterization and angiography of the IVC 3. Placement of IVC filter using fluoroscopic guidance Embolization of bilateral ovarian (gonadal) arteries 1. Ultrasound-guided puncture of the right common femoral artery 2. Catheterization of the aorta and pelvic angiogram 3. Catheterization and angiography of the left ovarian artery 4. Embolization of the left ovarian artery using beads and coils 5. Catheterization and angiography of the right ovarian artery 6. Embolization of the right ovarian artery using beads and coils MEDICATIONS: Documented in the EMR ANESTHESIA/SEDATION: Moderate (  conscious) sedation was employed during this procedure. A total of Versed 0.5 mg and Fentanyl 12.5 mcg was administered intravenously by the radiology nurse. Total intra-service moderate Sedation Time: 105 minutes. The patient's level of consciousness and vital signs were monitored continuously by radiology nursing throughout the procedure under my direct supervision. CONTRAST:  110 mL Omnipaque 300 FLUOROSCOPY: Radiation Exposure Index (as provided by the fluoroscopic device): 25.3 minutes (296 mGy) COMPLICATIONS: None immediate. PROCEDURE: Informed consent was obtained from the patient following explanation of the procedure, risks, benefits and alternatives. The patient understands, agrees and consents for the procedure. All questions were addressed. A time out was performed prior to the initiation of the procedure. Maximal barrier sterile technique utilized including caps, mask, sterile gowns, sterile gloves, large sterile drape, hand hygiene, and Betadine prep. The right groin and draped in the standard sterile fashion. Ultrasound was used to evaluate the right common femoral vein, which was found to be widely patent. Under ultrasound guidance, the right common femoral  vein was directly punctured using a 21 gauge micropuncture set. Wire was advanced centrally into the IVC, and the tract was serially dilated to accommodate the introducer sheath for the IVC filter. The introducer sheath of a Denali IVC filter with its flush inner dilator was then advanced over the wire to the iliocaval confluence. Angiography of the inferior vena cava was then performed. This demonstrates a normal caliber IVC which is widely patent without thrombus. Position of the inferior-most renal veins was noted. Under careful fluoroscopic guidance, a Denali IVC filter was deployed in an infrarenal location. Proper deployment was confirmed with radiography. After deployment of the IVC filter, attention was turned to embolization of the uterus. Ultrasound was used to evaluate the right common femoral artery, which found to be patent and suitable for access. An ultrasound image was permanently stored in the electronic medical record. Using ultrasound guidance, the right common femoral artery was directly punctured using a 21 gauge micropuncture set. An 018 wire was advanced centrally, followed by serial tract dilation and placement of a 5 French sheath. Using combination of a Bentson guidewire in pigtail catheter, the inferior abdominal aorta was catheterized, and pelvic angiogram performed in the AP projection. This demonstrated patent iliac vessels. No significant uterine artery vessels are identified, consistent with recent CT angiography findings. Therefore, the decision was made to proceed with embolization of the bilateral ovarian arteries, as these would appear to be the predominant supply to the uterus. Using a 5 [place] Mickelson catheter, the left ovarian artery was successfully catheterized. Angiography demonstrated a hypertrophied and tortuous ovarian artery that supplies the uterus. Using a combination of a 2.4 [place] Progreat Omega microcatheter and fathom 16 microwire, microcatheter was advanced to  the mid ovarian artery. Embolization was then performed using combination of 500-700 and 700-900 um embospheres, followed by both 4 mm and packing Ruby coils. Post embolization angiography demonstrated near-complete cessation of flow to the left ovarian artery. Again using a 5 [place] Mickelson catheter, the right ovarian artery was then successfully catheterized. Angiography again demonstrated a hypertrophied and tortuous ovarian artery supplying the uterus. Using a combination of a 2.4 [place] Progreat Omega microcatheter and fathom 16 microwire, microcatheter was advanced to the mid ovarian artery. Embolization was then performed using combination of 500-700 and 700-900 um embospheres, followed by both 4 mm and packing Ruby coils. Post embolization angiography demonstrated near-complete cessation of flow to the right ovarian artery. At the end the procedure, all catheters and wires were removed. Hemostasis was  achieved at the arterial access site using the Angio-Seal vascular closure device. Hemostasis was achieved at the venous access site using manual pressure. Sterile dressings was placed. The patient tolerated the procedure well without immediate complication. IMPRESSION: 1. Successful embolization of the bilateral ovarian arteries using the embospheres and coils. 2. Successful placement of an infrarenal IVC filter. Note that this filter is potentially retrievable if the risk of thromboembolism decreases, or the patient becomes a candidate to resume oral anticoagulation. Electronically Signed   By: Olive Bass M.D.   On: 07/17/2023 10:19   CT ANGIO HEAD NECK W WO CM W PERF (CODE STROKE)  Result Date: 07/17/2023 CLINICAL DATA:  Neuro deficit, acute, stroke suspected L sided weakness. EXAM: CT ANGIOGRAPHY HEAD AND NECK CT PERFUSION BRAIN TECHNIQUE: Multidetector CT imaging of the head and neck was performed using the standard protocol during bolus administration of intravenous contrast. Multiplanar CT  image reconstructions and MIPs were obtained to evaluate the vascular anatomy. Carotid stenosis measurements (when applicable) are obtained utilizing NASCET criteria, using the distal internal carotid diameter as the denominator. Multiphase CT imaging of the brain was performed following IV bolus contrast injection. Subsequent parametric perfusion maps were calculated using RAPID software. RADIATION DOSE REDUCTION: This exam was performed according to the departmental dose-optimization program which includes automated exposure control, adjustment of the mA and/or kV according to patient size and/or use of iterative reconstruction technique. CONTRAST:  OMNIPAQUE IOHEXOL 350 MG/ML SOLN COMPARISON:  MRI/MRA head 07/09/2023. FINDINGS: CTA NECK FINDINGS Aortic arch: Three-vessel arch configuration. Atherosclerotic calcifications of the aortic arch and arch vessel origins. Arch vessel origins are patent. Right carotid system: No evidence of dissection, stenosis (50% or greater) or occlusion. Mild calcified plaque along the right carotid bulb. Left carotid system: Mixed plaque results in severe stenosis of the proximal left cervical ICA (difficult to measure due to blooming artifact and severity of stenosis). Vertebral arteries: Codominant. No evidence of dissection, stenosis (50% or greater) or occlusion. Skeleton: Unremarkable. Other neck: Unremarkable. Upper chest: Small left pleural effusion. Review of the MIP images confirms the above findings CTA HEAD FINDINGS Anterior circulation: Calcified plaque along the carotid siphons without hemodynamically significant stenosis. 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 Unchanged moderate stenosis of the right MCA proximal inferior M2 division. The left MCA and bilateral ACAs are patent proximally without stenosis or aneurysm. Mild paucity of distal right MCA branches along the right frontal operculum. Posterior  circulation: Mild luminal irregularity of the basilar artery, likely atherosclerotic in etiology. The SCAs, AICAs and PICAs are patent proximally. Unchanged moderate stenosis of the bilateral PCA origins. Unchanged severe stenosis of the right PCA P2 segment. Slight asymmetric paucity of distal right PCA branches. Venous sinuses: As permitted by contrast timing, patent. Anatomic variants: None. Review of the MIP images confirms the above findings CT Brain Perfusion Findings: CBF (<30%) Volume: 0mL Perfusion (Tmax>6.0s) volume: 0mL (post processing identified 9 mm ischemic penumbra in the right frontal operculum, which corresponds to the subacute infarct seen on same day noncontrast head CT). Mismatch Volume: 0mL Infarction Location:Not applicable. IMPRESSION: 1. 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 2. Unchanged moderate stenosis of the right MCA proximal inferior M2 division. 3. Unchanged severe stenosis of the right PCA P2 segment. 4. Severe stenosis of the proximal left cervical ICA. 5. No core infarct on CT perfusion. Automated post processing identified 9 mm ischemic penumbra in the right frontal operculum, which corresponds  to the subacute infarct seen on same day noncontrast head CT. Aortic Atherosclerosis (ICD10-I70.0). Code stroke imaging results were communicated on 07/17/2023 at 9:36 am to provider Dr. Selina Cooley via secure text paging. Electronically Signed   By: Orvan Falconer M.D.   On: 07/17/2023 09:42   CT HEAD CODE STROKE WO CONTRAST`  Result Date: 07/17/2023 CLINICAL DATA:  Code stroke. Neuro deficit, acute, stroke suspected L sided weakness. EXAM: CT HEAD WITHOUT CONTRAST TECHNIQUE: Contiguous axial images were obtained from the base of the skull through the vertex without intravenous contrast. RADIATION DOSE REDUCTION: This exam was performed according to the departmental dose-optimization program which includes automated exposure control, adjustment of the mA and/or  kV according to patient size and/or use of iterative reconstruction technique. COMPARISON:  Head CT 07/08/2023.  MRI/MRA brain 07/09/2023. FINDINGS: Brain: No acute hemorrhage. 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 No new loss of gray-Folkes differentiation. No hydrocephalus or extra-axial collection. No mass effect or midline shift. Vascular: No hyperdense vessel or unexpected calcification. Skull: No calvarial fracture or suspicious bone lesion. Skull base is unremarkable. Sinuses/Orbits: No acute finding. Other: None. ASPECTS [facility] Stroke Program Early CT Score) - Ganglionic level infarction (caudate, lentiform nuclei, internal capsule, insula, M1-M3 cortex): 7 - Supraganglionic infarction (M4-M6 cortex): 3 Total score (0-10 with 10 being normal): 10 IMPRESSION: 1. No acute intracranial hemorrhage or evidence of new acute large vessel territory infarct. ASPECT score is 10. 2. 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 Code stroke imaging results were communicated on 07/17/2023 at 8:51 am to provider Dr. Selina Cooley via secure text paging. Electronically Signed   By: Orvan Falconer M.D.   On: 07/17/2023 08:53   CT Angio Abd/Pel w/ and/or w/o  Result Date: 07/15/2023 CLINICAL DATA:  Postmenopausal bleeding EXAM: CTA ABDOMEN AND PELVIS WITHOUT AND WITH CONTRAST TECHNIQUE: Multidetector CT imaging of the abdomen and pelvis was performed using the standard protocol during bolus administration of intravenous contrast. Multiplanar reconstructed images and MIPs were obtained and reviewed to evaluate the vascular anatomy. RADIATION DOSE REDUCTION: This exam was performed according to the departmental dose-optimization program which includes automated exposure control, adjustment of the mA and/or kV according to patient size and/or use of iterative reconstruction technique. CONTRAST:  75mL OMNIPAQUE IOHEXOL 350 MG/ML SOLN COMPARISON:  None Available. FINDINGS: VASCULAR  Aorta: No aneurysm or dissection. Extensive atherosclerotic calcification. Celiac: Patent without evidence of aneurysm, dissection, vasculitis or significant stenosis. SMA: Patent without evidence of aneurysm, dissection, vasculitis or significant stenosis. Renals: Both renal arteries are patent without evidence of aneurysm, dissection, vasculitis, fibromuscular dysplasia or significant stenosis. IMA: Patent without evidence of aneurysm, dissection, vasculitis or significant stenosis. Inflow: Patent without evidence of aneurysm, dissection, vasculitis or significant stenosis. Proximal Outflow: Bilateral common femoral and visualized portions of the superficial and profunda femoral arteries are patent without evidence of aneurysm, dissection, vasculitis or significant stenosis. Veins: Filling defect within the left common femoral vein at axial image # 68/15 is in keeping with a focal deep venous thrombus. A filling defect is also incidentally noted within the posterior segmental pulmonary artery of the right lower lobe the superior margin of the examination (image # 1/12) in keeping with an acute pulmonary embolus. There is marked mass effect upon the right external iliac vein by the enlarged uterus, best seen on image # 859/15. Review of the MIP images confirms the above findings. NON-VASCULAR Lower chest: As noted above, a acute pulmonary embolus partially visualized within the right  lower lobe. Cardiac size is mildly enlarged. Hepatobiliary: No focal liver abnormality is seen. No gallstones, gallbladder wall thickening, or biliary dilatation. Pancreas: Unremarkable Spleen: Unremarkable Adrenals/Urinary Tract: The adrenal glands are unremarkable. The kidneys are normal. Foley catheter balloon is seen looped within the bladder lumen which is partially decompressed. Stomach/Bowel: Stomach is within normal limits. Appendix appears normal. No evidence of bowel wall thickening, distention, or inflammatory changes.  Lymphatic: No pathologic adenopathy within the abdomen and pelvis. Reproductive: The uterus is enlarged and lobulated with multiple heterogeneously hypoenhancing masses likely representing multiple uterine fibroids. The endometrial cavity appears distended and there is heterogeneous intraluminal contents, best seen on sagittal image # 82/11 which may represent blood product or soft tissue as could be seen endometrial carcinoma. This hyperdense material appears to extend into the endocervical canal, but is not well assessed on this examination. No adnexal masses are seen. Other: Tiny fat containing umbilical hernia. Musculoskeletal: No acute bone abnormality. No lytic or blastic bone lesion. IMPRESSION: 1. Acute pulmonary embolus within the posterior segmental pulmonary artery of the right lower lobe. 2. Focal deep venous thrombus within the left common femoral vein. 3. Marked mass effect upon the right external iliac vein by the enlarged uterus. 4. Enlarged, lobulated uterus with multiple heterogeneously hypoenhancing masses likely representing multiple uterine fibroids. 5. Distended endometrial cavity with heterogeneous intraluminal contents which may represent blood product or soft tissue as could be seen endometrial carcinoma. This hyperdense material appears to extend into the endocervical canal, but is not well assessed on this examination. Correlation with dedicated pelvic sonography may be helpful for further evaluation. Aortic Atherosclerosis (ICD10-I70.0). Electronically Signed   By: Helyn Numbers M.D.   On: 07/15/2023 20:12   US Carotid Bilateral  Result Date: 07/12/2023 CLINICAL DATA:  Stroke.  History of hypertension and hyperlipidemia. EXAM: BILATERAL CAROTID DUPLEX ULTRASOUND TECHNIQUE: Wallace Cullens scale imaging, color Doppler and duplex ultrasound were performed of bilateral carotid and vertebral arteries in the neck. COMPARISON:  None Available. FINDINGS: Criteria: Quantification of carotid stenosis is  based on velocity parameters that correlate the residual internal carotid diameter with NASCET-based stenosis levels, using the diameter of the distal internal carotid lumen as the denominator for stenosis measurement. The following velocity measurements were obtained: RIGHT ICA: 103/17 cm/sec CCA: 95/8 cm/sec SYSTOLIC ICA/CCA RATIO:  1.1 ECA: 159 cm/sec LEFT ICA: 147/42 cm/sec CCA: 116/14 cm/sec SYSTOLIC ICA/CCA RATIO:  0.3 ECA: 105 cm/sec RIGHT CAROTID ARTERY: There is a moderate amount of eccentric echogenic plaque within the distal aspect of the right common carotid artery (image 11). There is a moderate amount of eccentric echogenic plaque within the right carotid bulb (images 14 and 15), extending to involve the origin and proximal aspects of the right internal carotid artery (image 22), not resulting in elevated peak systolic velocities within the interrogated course of the right internal carotid artery to suggest a hemodynamically significant stenosis. RIGHT VERTEBRAL ARTERY:  Antegrade flow LEFT CAROTID ARTERY: There is a moderate amount of eccentric echogenic partially shadowing plaque within left carotid bulb (images 45 and 46), extending to involve the origin and proximal aspects of the right internal carotid artery (image 53), which results in elevated peak systolic velocities within the proximal and mid aspects of the left internal carotid artery. Greatest acquired peak systolic velocity within the proximal left ICA measures 147 centimeters/second (image 55). LEFT VERTEBRAL ARTERY:  Antegrade flow IMPRESSION: 1. Moderate amount of left-sided atherosclerotic plaque results in elevated peak systolic velocities of the left internal carotid artery compatible  with the 50-69% luminal narrowing range. Further evaluation with CTA could performed as indicated. 2. Moderate amount of right-sided atherosclerotic plaque, not definitively resulting in a hemodynamically significant stenosis. Electronically Signed    By: Simonne Come M.D.   On: 07/12/2023 13:04   ECHOCARDIOGRAM COMPLETE  Result Date: 07/09/2023    ECHOCARDIOGRAM REPORT   Patient Name:   [facility] Date of Exam: 07/09/2023 Medical Rec #:  [id number]          Height:       64.0 in Accession #:    [id number]         Weight:       128.0 lb Date of Birth:  [birth year]          BSA:          1.618 m Patient Age:    89 years           BP:           101/62 mmHg Patient Gender: F                  HR:           119 bpm. Exam Location:  [facility] Procedure: 2D Echo, Cardiac Doppler and Color Doppler Indications:     Stroke  History:         Patient has no prior history of Echocardiogram examinations.                  Stroke, Arrythmias:Atrial Fibrillation, Signs/Symptoms:Dyspnea                  and Fatigue; Risk Factors:Hypertension and Dyslipidemia.  Sonographer:     Mikki Harbor Referring Phys:  [id number] Andris Baumann Diagnosing Phys: [name] MD IMPRESSIONS  1. Left ventricular ejection fraction, by estimation, is 55 to 60%. The left ventricle has normal function. The left ventricle has no regional wall motion abnormalities. Left ventricular diastolic function could not be evaluated.  2. Right ventricular systolic function is normal. The right ventricular size is normal. There is moderately elevated pulmonary artery systolic pressure.  3. Left atrial size was mildly dilated.  4. The mitral valve is degenerative. Mild to moderate mitral valve regurgitation.  5. Tricuspid valve regurgitation is moderate.  6. The aortic valve is normal in structure. Aortic valve regurgitation is trivial. FINDINGS  Left Ventricle: Left ventricular ejection fraction, by estimation, is 55 to 60%. The left ventricle has normal function. The left ventricle has no regional wall motion abnormalities. The left ventricular internal cavity size was normal in size. There is  no left ventricular hypertrophy. Left ventricular diastolic function could not be evaluated. Right Ventricle:  The right ventricular size is normal. No increase in right ventricular wall thickness. Right ventricular systolic function is normal. There is moderately elevated pulmonary artery systolic pressure. The tricuspid regurgitant velocity is 3.32 m/s, and with an assumed right atrial pressure of 8 mmHg, the estimated right ventricular systolic pressure is 52.1 mmHg. Left Atrium: Left atrial size was mildly dilated. Right Atrium: Right atrial size was normal in size. Pericardium: There is no evidence of pericardial effusion. Mitral Valve: Eccentric posterior jet. The mitral valve is degenerative in appearance. Mild to moderate mitral valve regurgitation. MV peak gradient, 4.0 mmHg. The mean mitral valve gradient is 2.0 mmHg. Tricuspid Valve: Eccentric posterior jet. The tricuspid valve is grossly normal. Tricuspid valve regurgitation is moderate. Aortic Valve: The aortic valve is normal in structure. Aortic valve regurgitation  is trivial. Aortic valve mean gradient measures 5.7 mmHg. Aortic valve peak gradient measures 11.9 mmHg. Aortic valve area, by VTI measures 1.57 cm. Pulmonic Valve: The pulmonic valve was normal in structure. Pulmonic valve regurgitation is not visualized. Aorta: The ascending aorta was not well visualized. IAS/Shunts: No atrial level shunt detected by color flow Doppler.  LEFT VENTRICLE PLAX 2D LVIDd:         4.40 cm LVIDs:         3.10 cm LV PW:         1.10 cm LV IVS:        0.80 cm LVOT diam:     2.00 cm LV SV:         50 LV SV Index:   31 LVOT Area:     3.14 cm  RIGHT VENTRICLE RV Basal diam:  3.60 cm RV Mid diam:    2.70 cm RV S prime:     13.40 cm/s LEFT ATRIUM             Index        RIGHT ATRIUM           Index LA diam:        4.30 cm 2.66 cm/m   RA Area:     22.30 cm LA Vol (A2C):   93.7 ml 57.90 ml/m  RA Volume:   64.70 ml  39.98 ml/m LA Vol (A4C):   78.7 ml 48.63 ml/m LA Biplane Vol: 85.8 ml 53.02 ml/m  AORTIC VALVE                     PULMONIC VALVE AV Area (Vmax):    1.64 cm       PV Vmax:       1.32 m/s AV Area (Vmean):   1.44 cm      PV Peak grad:  7.0 mmHg AV Area (VTI):     1.57 cm AV Vmax:           172.67 cm/s AV Vmean:          110.000 cm/s AV VTI:            0.319 m AV Peak Grad:      11.9 mmHg AV Mean Grad:      5.7 mmHg LVOT Vmax:         90.20 cm/s LVOT Vmean:        50.450 cm/s LVOT VTI:          0.159 m LVOT/AV VTI ratio: 0.50  AORTA Ao Root diam: 3.00 cm MITRAL VALVE               TRICUSPID VALVE MV Area (PHT): 5.62 cm    TR Peak grad:   44.1 mmHg MV Area VTI:   2.93 cm    TR Vmax:        332.00 cm/s MV Peak grad:  4.0 mmHg MV Mean grad:  2.0 mmHg    SHUNTS MV Vmax:       1.00 m/s    Systemic VTI:  0.16 m MV Vmean:      58.3 cm/s   Systemic Diam: 2.00 cm MV Decel Time: 135 msec MV E velocity: 91.70 cm/s [name] MD Electronically signed by [name] MD Signature Date/Time: 07/09/2023/1:15:37 PM    Final    MR BRAIN WO CONTRAST  Addendum Date: 07/09/2023   ADDENDUM REPORT: 07/09/2023 03:11 ADDENDUM: In addition to the initially described  findings, note is made of a 9 mm T1 hypointense lesion within the C4 vertebral body, indeterminate. This is described in the body of the report, omitted from the impression by mistake. Further evaluation with dedicated MRI of the cervical spine, with and without contrast, suggested for further evaluation as warranted. Electronically Signed   By: Rise Mu M.D.   On: 07/09/2023 03:11   Result Date: 07/09/2023 CLINICAL DATA:  Initial evaluation for neuro deficit, stroke suspected. Left-sided deficit. EXAM: MRI HEAD WITHOUT CONTRAST MRA HEAD WITHOUT CONTRAST TECHNIQUE: Multiplanar, multi-echo pulse sequences of the brain and surrounding structures were acquired without intravenous contrast. Angiographic images of the Circle of Willis were acquired using MRA technique without intravenous contrast. COMPARISON:  Comparison made with prior CT from 07/08/2023. FINDINGS: MRI HEAD FINDINGS Brain: Cerebral volume within  normal limits. Minimal hazy FLAIR signal abnormality noted involving the periventricular [place] matter, likely related to chronic microvascular ischemic disease, minimal in nature and less than is typically seen for age. Tiny remote infarct present at the right occipital lobe (series 20, image 21). Additional tiny remote left cerebellar infarct noted. Subtle focus of diffusion signal abnormality measuring approximately 1.5 cm seen at the right frontal corona radiata (series 10, image 78). Associated subtle signal loss on ADC map (series 11, image 30). Findings suspicious for a possible acute ischemic infarct. No associated hemorrhage or mass effect. No other evidence for acute or subacute ischemia. Gray-Maciejewski matter differentiation otherwise maintained. No areas of chronic cortical infarction. No significant acute or chronic intracranial blood products. No mass lesion, midline shift or mass effect. No hydrocephalus or extra-axial fluid collection. Pituitary gland and suprasellar region within normal limits. Vascular: Major intracranial vascular flow voids are maintained. Skull and upper cervical spine: Craniocervical junction within normal limits. 9 mm T1 hypointense lesion noted within the visualized C4 vertebral body (series 14, image 12), indeterminate. Bone marrow signal intensity otherwise normal. No scalp soft tissue abnormality. Sinuses/Orbits: Prior ocular lens replacement on the right. Globes and orbital soft tissues otherwise unremarkable. Paranasal sinuses are largely clear. No significant mastoid effusion. Other: None. MRA HEAD FINDINGS Anterior circulation: Visualized distal cervical segments of the internal carotid arteries are mildly tortuous but patent with antegrade flow. Petrous, cavernous, and supraclinoid segments patent. Left A1 segment widely patent. Focal severe right A1 stenosis (series 19147, image 8). Normal anterior communicating artery complex. Anterior cerebral arteries patent without  stenosis. No M1 stenosis or occlusion. Left MCA branches well perfused. On the right, there is acute occlusion of a proximal right M2 branch, superior division (series 82956, image 13). Inferior division remains patent and perfused, although a probable moderate proximal right M2 stenosis noted (series 21308, image 12). Posterior circulation: Both vertebral arteries are patent to the vertebrobasilar junction without visible stenosis. Right PICA patent. Left PICA not well seen. Basilar patent without stenosis. Superior cerebral arteries patent bilaterally. Both PCAs primarily supplied via the basilar. Focal moderate left P2 stenosis (series 1021, image 2). Left PCA otherwise patent. On the right, there is a severe distal right P2 stenosis (series 1021, image 7). Right PCA is markedly attenuated distally and only faintly visible on 3D time-of-flight sequence. Anatomic variants: None significant.  No aneurysm. IMPRESSION: MRI HEAD IMPRESSION: 1. Subtle 1.5 cm focus of diffusion signal abnormality involving the right frontal corona radiata, suspicious for an acute ischemic infarct. No associated hemorrhage or mass effect. 2. 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 MRA HEAD IMPRESSION: 1. Acute occlusion  of a proximal right M2 branch, superior division. 2. Additional moderate proximal right M2 stenosis, inferior division. 3. Severe distal right P2 stenosis, with additional moderate left P2 stenosis. 4. Focal severe right A1 stenosis. Electronically Signed: By: Rise Mu M.D. On: 07/09/2023 03:05   MR ANGIO HEAD WO CONTRAST  Addendum Date: 07/09/2023   ADDENDUM REPORT: 07/09/2023 03:11 ADDENDUM: In addition to the initially described findings, note is made of a 9 mm T1 hypointense lesion within the C4 vertebral body, indeterminate. This is described in the body of the report, omitted from the impression by mistake. Further  evaluation with dedicated MRI of the cervical spine, with and without contrast, suggested for further evaluation as warranted. Electronically Signed   By: Rise Mu M.D.   On: 07/09/2023 03:11   Result Date: 07/09/2023 CLINICAL DATA:  Initial evaluation for neuro deficit, stroke suspected. Left-sided deficit. EXAM: MRI HEAD WITHOUT CONTRAST MRA HEAD WITHOUT CONTRAST TECHNIQUE: Multiplanar, multi-echo pulse sequences of the brain and surrounding structures were acquired without intravenous contrast. Angiographic images of the Circle of Willis were acquired using MRA technique without intravenous contrast. COMPARISON:  Comparison made with prior CT from 07/08/2023. FINDINGS: MRI HEAD FINDINGS Brain: Cerebral volume within normal limits. Minimal hazy FLAIR signal abnormality noted involving the periventricular [place] matter, likely related to chronic microvascular ischemic disease, minimal in nature and less than is typically seen for age. Tiny remote infarct present at the right occipital lobe (series 20, image 21). Additional tiny remote left cerebellar infarct noted. Subtle focus of diffusion signal abnormality measuring approximately 1.5 cm seen at the right frontal corona radiata (series 10, image 78). Associated subtle signal loss on ADC map (series 11, image 30). Findings suspicious for a possible acute ischemic infarct. No associated hemorrhage or mass effect. No other evidence for acute or subacute ischemia. Gray-Wolken matter differentiation otherwise maintained. No areas of chronic cortical infarction. No significant acute or chronic intracranial blood products. No mass lesion, midline shift or mass effect. No hydrocephalus or extra-axial fluid collection. Pituitary gland and suprasellar region within normal limits. Vascular: Major intracranial vascular flow voids are maintained. Skull and upper cervical spine: Craniocervical junction within normal limits. 9 mm T1 hypointense lesion noted within  the visualized C4 vertebral body (series 14, image 12), indeterminate. Bone marrow signal intensity otherwise normal. No scalp soft tissue abnormality. Sinuses/Orbits: Prior ocular lens replacement on the right. Globes and orbital soft tissues otherwise unremarkable. Paranasal sinuses are largely clear. No significant mastoid effusion. Other: None. MRA HEAD FINDINGS Anterior circulation: Visualized distal cervical segments of the internal carotid arteries are mildly tortuous but patent with antegrade flow. Petrous, cavernous, and supraclinoid segments patent. Left A1 segment widely patent. Focal severe right A1 stenosis (series 78295, image 8). Normal anterior communicating artery complex. Anterior cerebral arteries patent without stenosis. No M1 stenosis or occlusion. Left MCA branches well perfused. On the right, there is acute occlusion of a proximal right M2 branch, superior division (series 62130, image 13). Inferior division remains patent and perfused, although a probable moderate proximal right M2 stenosis noted (series 86578, image 12). Posterior circulation: Both vertebral arteries are patent to the vertebrobasilar junction without visible stenosis. Right PICA patent. Left PICA not well seen. Basilar patent without stenosis. Superior cerebral arteries patent bilaterally. Both PCAs primarily supplied via the basilar. Focal moderate left P2 stenosis (series 1021, image 2). Left PCA otherwise patent. On the right, there is a severe distal right P2 stenosis (series 1021, image 7). Right PCA is markedly attenuated  distally and only faintly visible on 3D time-of-flight sequence. Anatomic variants: None significant.  No aneurysm. IMPRESSION: MRI HEAD IMPRESSION: 1. Subtle 1.5 cm focus of diffusion signal abnormality involving the right frontal corona radiata, suspicious for an acute ischemic infarct. No associated hemorrhage or mass effect. 2. Underlying minor chronic microvascular ischemic disease with tiny  remote right occipital and left cerebellar infarcts. MRA HEAD IMPRESSION: 1. Acute occlusion of a proximal right M2 branch, superior division. 2. Additional moderate proximal right M2 stenosis, inferior division. 3. Severe distal right P2 stenosis, with additional moderate left P2 stenosis. 4. Focal severe right A1 stenosis. Electronically Signed: By: Rise Mu M.D. On: 07/09/2023 03:05   US PELVIC COMPLETE WITH TRANSVAGINAL  Result Date: 07/08/2023 CLINICAL DATA:  Vaginal bleeding in a 89 year old female EXAM: TRANSABDOMINAL ULTRASOUND OF PELVIS TECHNIQUE: Transabdominal ultrasound examination of the pelvis was performed including evaluation of the uterus, ovaries, adnexal regions, and pelvic cul-de-sac. Patient declined transvaginal exam. COMPARISON:  None Available. FINDINGS: Uterus Measurements: 50.3 x 6.6 x 8.6 cm = volume: 452 mL. Enlarged heterogenous uterus with multiple fibroids measuring up to 7.8 cm. Endometrium Thickness: 24 mm. Thickened heterogenous endometrium without definite focal lesion. Right ovary Not visualized due to enlarged heterogenous uterus and overlying bowel. Left ovary Not visualized due to enlarged heterogenous uterus and overlying. Other findings:  No abnormal free fluid. IMPRESSION: 1. Thickened heterogenous endometrium measuring up to 24 mm. In the setting of post-menopausal bleeding, endometrial sampling is indicated to exclude carcinoma. If results are benign, sonohysterogram should be considered for focal lesion work-up. (Ref: Radiological Reasoning: Algorithmic Workup of Abnormal Vaginal Bleeding with Endovaginal Sonography and Sonohysterography. AJR 2008; 865:H84-69) 2. Enlarged heterogenous uterus with multiple fibroids. 3. Nonvisualization of the ovaries. Electronically Signed   By: Minerva Fester M.D.   On: 07/08/2023 20:49   CT HEAD CODE STROKE WO CONTRAST  Result Date: 07/08/2023 CLINICAL DATA:  Code stroke.  Neuro deficit, acute, stroke suspected EXAM:  CT HEAD WITHOUT CONTRAST TECHNIQUE: Contiguous axial images were obtained from the base of the skull through the vertex without intravenous contrast. RADIATION DOSE REDUCTION: This exam was performed according to the departmental dose-optimization program which includes automated exposure control, adjustment of the mA and/or kV according to patient size and/or use of iterative reconstruction technique. COMPARISON:  CT head 07/21/2017. FINDINGS: Brain: No evidence of acute infarction, hemorrhage, hydrocephalus, extra-axial collection or mass lesion/mass effect. Vascular: No hyperdense vessel or unexpected calcification. Skull: Normal. Negative for fracture or focal lesion. Sinuses/Orbits: No acute finding. Other: None. ASPECTS [facility] Stroke Program Early CT Score) Total score (0-10 with 10 being normal): 10. IMPRESSION: 1. No evidence of acute intracranial abnormality. 2. ASPECTS is 10. Code stroke imaging results were communicated on 07/08/2023 at 8:12 pm to provider Chesley Noon via telephone, who verbally acknowledged these results. Electronically Signed   By: Feliberto Harts M.D.   On: 07/08/2023 20:13    ECHO 07/2023:  1. Left ventricular ejection fraction, by estimation, is 55 to 60%. The  left ventricle has normal function. The left ventricle has no regional  wall motion abnormalities. Left ventricular diastolic function could not  be evaluated.   2. Right ventricular systolic function is normal. The right ventricular  size is normal. There is moderately elevated pulmonary artery systolic  pressure.   3. Left atrial size was mildly dilated.   4. The mitral valve is degenerative. Mild to moderate mitral valve  regurgitation.   5. Tricuspid valve regurgitation is moderate.   6. The  aortic valve is normal in structure. Aortic valve regurgitation is  trivial.   TELEMETRY reviewed by me [facility]) 07/17/2023 : atrial fibrillation rate 90-100s, paroxysms up to 140s  EKG reviewed by me: atrial  fibrillation rate 101 bpm  Data reviewed by me [facility]) 07/17/2023: last 24h vitals tele labs imaging I/O ED provider note, hospitalist progress notes  Principal Problem:   ABLA (acute blood loss anemia) Active Problems:   Chronic systolic (congestive) heart failure (HCC)   Chronic anticoagulation   Acute CVA (cerebrovascular accident) (HCC)   Post-menopausal bleeding   Atrial fibrillation with RVR (HCC)   Hypotension   Acute arterial ischemic stroke, multifocal, anterior circulation, right (HCC)   B12 deficiency anemia   Malnutrition of moderate degree   Hypernatremia   Acute kidney injury superimposed on CKD (HCC)   Thrombocytopenia (HCC)   Vaginal bleeding    ASSESSMENT AND PLAN:  Marissa Gilmore is a 89 y.o. female  with a past medical history of paroxysmal atrial fibrillation, chronic HFrEF (EF 55-60% on echo this admission), NICM, peripheral vascular disease, hypertension, hyperlipidemia,  who presented to the ED on 07/08/2023 for vaginal bleeding. 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 During her hospitalization she has been treated for anemia and has received 6 units PRBCs. Has been intermittently in atrial fibrillation, she has a history of paroxysmal afib. Rates have been elevated at times. Cardiology was consulted for further evaluation and management of AF RVR.   # Atrial fibrillation RVR # Chronic HFrEF, recovered Patient with history of paroxysmal atrial fibrillation now with persistent AF in the setting of acute anemia. Rate generally controlled now but elevated at times to the 140s intermittently. -Increase diltiazem to 60 mg tid. Continue metoprolol 25 mg twice daily. PRN IV metoprolol for elevated rates.  -Previously taking amiodarone but has not received this since admission.  -Anticoagulation held in the setting of acute anemia. Consider restarting when Hgb stable. -Monitor fluid status closely. Despite  fluids and multiple units of blood patient is net positive 900 mL.  -Home GDMT has been held.   # Acute CVA Noted on MRI 9/4, right frontal corona radiata.  -Restart anticoagulation when Hgb stable.  -Continue atorvastatin 40 mg daily.   # Anemia # Vaginal bleeding Patient presented with vaginal bleeding found to be anemic and required 6 units PRBCs throughout admission. Hgb improved this AM to 9.7.  -S/p bilateral ovarian artery embolization and IVC filter 07/16/2023 -Continue to monitor H&H closely.    This patient's plan of care was discussed and created with Dr. Darrold Junker and he is in agreement.  Signed: Gale Journey, PA-C  07/17/2023, 10:52 AM [facility]

## 2023-07-17 NOTE — Progress Notes (Signed)
PT Cancellation Note  Patient Details Name: Traneisha Tuchman MRN: [id number] DOB: 03-02-1931   Cancelled Treatment:    Reason Eval/Treat Not Completed: Other (comment). Pt out of room at this time, noted for code stroke called this AM as well. PT to attempt pending medical appropriateness.  Olga Coaster PT, DPT 8:56 AM,07/17/23

## 2023-07-17 NOTE — Progress Notes (Signed)
Progress Note   Patient: Marissa Gilmore WNU:[id number] DOB: 08-27-1931 DOA: 07/08/2023     8 DOS: the patient was seen and examined on 07/17/2023   Brief hospital course: Marissa Gilmore is a 89 y.o. female with medical history significant for Chronic atrial fibrillation on Eliquis and amiodarone, systolic CHF, HTN and HLD who presented to the ED initially with a 2-day history of persistent vaginal bleeding including passage of golf ball size clots.  While in the emergency room, patient developed atrial fibrillation with RVR, then subsequently she developed left-sided facial droop and slurred speech. MRI brain showed subtle 1.5 cm focus of diffusion signal abnormality involving the right frontal corona radiata, suspicious for an acute ischemic infarct. MRA head: 1. Acute occlusion of a proximal right M2 branch, superior division. 2. Additional moderate proximal right M2 stenosis, inferior division. 3. Severe distal right P2 stenosis, with additional moderate left P2 stenosis. 4. Focal severe right A1 stenosis. Patient is seen by neurology, due to large amount of vaginal bleeding, anticoagulation was not able to start. Patient continued to have vaginal bleeding while in the hospital. D&C was performed on 9/7 for vaginal bleeding.  9/11.  Reviewed that patient received 5 units of packed red blood cells during the hospital course already.  Will give another unit of packed red blood cells today for a heme globin that dipped down to 7.9.  Case discussed with gynecology and interventional radiology and they did a bilateral gonadal artery embolization and IVC filter.  Stopped heparin drip with continued vaginal bleeding. 9/12.  Called this morning with rapid atrial fibrillation.  When I saw the patient she was drooling out of the left side of her mouth and had left arm and left leg weakness.  Case discussed with Dr. Selina Gilmore neurology who came and saw the patient quickly and a code stroke was called.   CT angio showed unchanged occlusion of the right MCA proximal superior M2 division, unchanged moderate stenosis of the right MCA proximal inferior M2 division, unchanged severe stenosis of the right PCA P2 division, severe stenosis of the proximal left cervical ICA, no core infarct on the CT perfusion, 9 mm ischemic penumbra in the right frontal operculum.  CT head shows evolving late subacute infarct of the right frontal operculum.  Case discussed with neurology and will give aspirin.  Unable to give major blood thinner right now with her vaginal bleeding.   Assessment and Plan: * Acute CVA (cerebrovascular accident) (HCC) Code stroke called this morning with left-sided weakness.  Patient having evolution of stroke on imaging.  Case discussed with neurology and will obtain an MRI of the brain and give aspirin.  Unable to give major blood thinner currently with bilateral gonadal artery embolization yesterday.  Atrial fibrillation with RVR (HCC) Atrial fibrillation with rapid ventricular response.  On metoprolol and Cardizem.  Unable to give Eliquis at this point.  Patient also given IV Cardizem earlier today and as needed IV metoprolol for rapid heart rate.  Dose of Cardizem orally titrated up.  ABLA (acute blood loss anemia) Postmenopausal vaginal bleeding. Patient received 6 units of packed red blood cells during the hospital course.  Patient had bilateral gonadal artery embolization yesterday by interventional radiology.  This morning's hemoglobin up at 9.7.    Hypotension Resolved  Thrombocytopenia (HCC) Platelet count 86 today.  Continue to monitor  Acute kidney injury superimposed on CKD (HCC) Creatinine peaked at 1.76 on 9/8.  Creatinine down to 1.26 today.  Hypernatremia Resolved  Malnutrition  of moderate degree Continue supplements  B12 deficiency anemia Continue B12 supplementation  Chronic systolic (congestive) heart failure (HCC) EF 30% 2021 Clinically euvolemic Watch  closely with blood transfusion.        Subjective: Called to see patient secondary to atrial fibrillation rapid ventricular response.  She was drooling on the left side of her mouth and had left-sided weakness.  Case discussed with neurology and a code stroke was called.  Physical Exam: Vitals:   07/17/23 0555 07/17/23 0643 07/17/23 0733 07/17/23 1141  BP:   139/79 130/87  Pulse:   97 100  Resp: (!) 22 (!) 22 (!) 22 20  Temp:   97.8 F (36.6 C) 98.4 F (36.9 C)  TempSrc:   Oral Axillary  SpO2:   98% 100%  Weight:    61.8 kg  Height:    5\' 4"  (1.626 m)   Physical Exam HENT:     Head: Normocephalic.     Mouth/Throat:     Pharynx: No oropharyngeal exudate.  Eyes:     General: Lids are normal.     Conjunctiva/sclera: Conjunctivae normal.  Cardiovascular:     Rate and Rhythm: Tachycardia present. Rhythm irregularly irregular.     Heart sounds: Normal heart sounds, S1 normal and S2 normal.  Pulmonary:     Breath sounds: No decreased breath sounds, wheezing, rhonchi or rales.  Abdominal:     Palpations: Abdomen is soft.     Tenderness: There is no abdominal tenderness.  Musculoskeletal:     Right lower leg: No swelling.     Left lower leg: No swelling.  Skin:    General: Skin is warm.     Findings: No rash.  Neurological:     Mental Status: She is alert.     Comments: Barely able to straight leg raise with left leg.  Weakness left arm.     Data Reviewed: Creatinine 1.26, hemoglobin 9.7, Marissa Gilmore blood count 18.8, platelet count 86 Family Communication: Spoke with patient's POA  Disposition: Status is: Inpatient Remains inpatient appropriate because: Transferred to ICU for closer clinical monitoring with progression of stroke and atrial fibrillation with rapid ventricular response  Planned Discharge Destination: Rehab    Time spent: 35 minutes Patient critically ill, high risk for cardiopulmonary arrest.  Limited with blood thinner with stroke.  Case discussed with  cardiology and neurology and coordination of care.  Author: Alford Highland, MD 07/17/2023 12:13 PM  For on call review [email or web address].

## 2023-07-17 NOTE — Consult Note (Signed)
Neurology progress note  Patient summary: This is a 89 year old woman with a medical history of chronic A-fib on Eliquis and amiodarone prior to admission, systolic heart failure, hypertension, hyperlipidemia who presented to the ED on July 09, 2023 with a 2-day history of persistent vaginal bleeding including passage of golf ball size clots.  In the ER she developed A-fib with RVR and then subsequently developed a left-sided facial droop and slurred speech.  MRI brain showed an acute ischemic infarct in the right frontal corona radiata.  She was initially evaluated by teleneurology who did not feel that she was a candidate for TNK due to her ongoing bleeding.  MRA head showed acute occlusion of the proximal right M2 branch superior division but was felt to be too high risk for thrombectomy, please see subsequent consult note from Dr. Caryl Pina on July 09, 2023 later in the day. Since we last saw her she did undergo infrarenal IVC filter placement and embolization of the bilateral gonadal arteries yesterday 9/11. Her Hgb remained stable this AM.  S: Neurology was reconsulted today 2/2 concern for acute worsening of her left sided weakness.. LKW unclear, although hospitalist reports that on rounds yesterday she was able to move both LUE and LLE anti-gravity and today she is much weaker in both and has a more prominent L facial droop than before. Stroke code was activated. NIHSS = 9. Patient was not a candidate for TNK 2/2 bleeding risk and unknown last known well. CTA H&N redemonstrated R M2 occlusion and stable occlusions and stenoses elsewhere described below. 2/2 no emergent LVO patient was not a candidate for thrombectomy.  O:  Vitals:   07/17/23 1141 07/17/23 1144 07/17/23 1200 07/17/23 1300  BP: 130/87 130/87 (!) 143/80 (!) 150/61  Pulse: 100 64 (!) 42 62  Resp: 20 18 20  (!) 21  Temp: 98.4 F (36.9 C)     TempSrc: Axillary     SpO2: 100% 99% 99% 99%  Weight: 61.8 kg     Height: 5'  4" (1.626 m)        Body mass index is 23.39 kg/m.  Physical Exam Gen: A&O x4, NAD HEENT: Atraumatic, normocephalic;mucous membranes moist; oropharynx clear, tongue without atrophy or fasciculations. Neck: Supple, trachea midline. Resp: CTAB, no w/r/r CV: RRR, no m/g/r; nml S1 and S2. 2+ symmetric peripheral pulses. Abd: soft/NT/ND; nabs x 4 quad Extrem: Nml bulk; no cyanosis, clubbing, or edema.  Neuro: *MS: A&O x3. Follows simple commands *Speech: mild-to-moderate dysarthria, no aphasia *CN:    I: Deferred   II,III: PERRLA, VFF by confrontation, optic discs unable to be visualized 2/2 pupillary constriction   III,IV,VI: EOMI w/o nystagmus, no ptosis   V: Sensation intact from V1 to V3 to LT   VII: Eyelid closure was full.  L UMN facial droop   VIII: Hearing intact to voice   IX,X: Voice normal, palate elevates symmetrically    XI: SCM/trap 5/5 bilat   XII: Tongue protrudes midline, no atrophy or fasciculations  *Motor:   Normal bulk.  No tremor, rigidity or bradykinesia. No pronator drift. No movement against gravity LUE. LLE drift to bed. No drift in RUE or RLE. *Sensory: Sensory impairment to LT on L. No double-simultaneous extinction.  *Coordination:  Intact on R, UTA on L *Reflexes:  2+ and symmetric throughout without clonus; toes down-going bilat *Gait: deferred  NIHSS  1a Level of Conscious.: 0 1b LOC Questions: 0 1c LOC Commands: 0 2 Best Gaze: 0 3 Visual: 0 4  Facial Palsy: 2 5a Motor Arm - left: 3 5b Motor Arm - Right: 0 6a Motor Leg - Left: 1 6b Motor Leg - Right: 0 7 Limb Ataxia: 0 8 Sensory: 1 9 Best Language: 0 10 Dysarthria: 1 11 Extinct. and Inatten.: 0  TOTAL: 8   Premorbid mRS = 3  Stroke code imaging from 07/17/23:  CT Head without contrast: 1. No acute intracranial hemorrhage or evidence of new acute large vessel territory infarct. ASPECT score is 10. 2. 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  CT angio  Head and Neck with contrast: 1. 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 2. Unchanged moderate stenosis of the right MCA proximal inferior M2 division. 3. Unchanged severe stenosis of the right PCA P2 segment. 4. Severe stenosis of the proximal left cervical ICA. 5. No core infarct on CT perfusion. Automated post processing identified 9 mm ischemic penumbra in the right frontal operculum, which corresponds to the subacute infarct seen on same day noncontrast head CT.  CNS imaging personally reviewed; I agree with above interpretations  Prior stroke workup earlier this hospitalization:  MRI brain: Subtle 1.5 cm focus of faint diffusion signal abnormality involving the right frontal corona radiata, suspicious for an acute ischemic infarct. No associated hemorrhage or mass effect. 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   MRA head: Acute occlusion of a proximal right M2 branch, superior division. Additional moderate proximal right M2 stenosis, inferior division. Severe distal right P2 stenosis, with additional moderate left P2 stenosis. Focal severe right A1 stenosis.  TTE: LVEF 55 to 60%. The left ventricle has normal function. The left ventricle has no regional wall motion abnormalities. Left atrial size was mildly dilated. No valvular vegetation or mural thrombus mentioned in the report.   B12 is low at 135, consistent with deficiency  A/P:  This is a 89 year old woman with a medical history of chronic A-fib on Eliquis and amiodarone prior to admission, systolic heart failure, hypertension, hyperlipidemia who presented to the ED on July 09, 2023 with a 2-day history of persistent vaginal bleeding. In the ER she developed a fib with RVR and subsequently L sided weakness and was subsequently found to have R corona radiata ischemic infarct 2/2 R M2 occlusion. Unfortunately she was not  a candidate for thrombolytics and the risks of thrombectomy were felt to outweigh potential benefits.  Neurology was reconsulted today 2/2 concern for acute worsening of her left sided weakness.. LKW unclear, although hospitalist reports that on rounds yesterday she was able to move both LUE and LLE anti-gravity and today she is much weaker in both and has a more prominent L facial droop than before. Stroke code was activated. NIHSS = 9. Patient was not a candidate for TNK 2/2 bleeding risk and unknown last known well. CTA H&N redemonstrated R M2 occlusion and stable occlusions and stenoses elsewhere described below. 2/2 no emergent LVO patient was not a candidate for thrombectomy.   Recommendations:  - Permissive HTN x48 hrs from sx onset or until stroke ruled out by MRI goal BP <220/110. PRN labetalol or hydralazine if BP above these parameters. Avoid oral antihypertensives. - MRI brain wo contrast - No indication to repeat CTA or TTE - Check A1c and LDL + add statin per guidelines - Continue 40mg  atorvastatin daily - Patient is not a candidate for anticoagulation at this time in the setting of recent severe bleeding. Unfortunately therefore  she will be at persistent high risk of embolic events. - Since she had bilateral gonadal artery embolization yesterday and her Hgb remained stable today, Dr. Renae Gloss is OK with starting aspirin. - q2 hr neuro checks x12 hrs, then may relax to q4 - STAT head CT for any change in neuro exam - Tele - PT/OT/SLP - Stroke education - Amb referral to neurology upon discharge - Will continue to follow  ______________________________________________________________________   Thank you for the opportunity to take part in the care of this patient. If you have any further questions, please contact the neurology consultation attending.  Signed,  Bing Neighbors, MD [facility] [phone]  If 7pm- 7am, please page neurology on call as listed in  [facility].  **Any copied and pasted documentation in this note was written by me in another application not billed for and pasted by me into this document.

## 2023-07-17 NOTE — Plan of Care (Signed)
  Problem: Education: Goal: Knowledge of disease or condition will improve 07/17/2023 1636 by Rosemary Holms, RN Outcome: Progressing 07/17/2023 1634 by Rosemary Holms, RN Outcome: Progressing   Problem: Health Behavior/Discharge Planning: Goal: Ability to manage health-related needs will improve 07/17/2023 1636 by Rosemary Holms, RN Outcome: Progressing 07/17/2023 1634 by Rosemary Holms, RN Outcome: Progressing Goal: Goals will be collaboratively established with patient/family 07/17/2023 1636 by Rosemary Holms, RN Outcome: Progressing 07/17/2023 1634 by Rosemary Holms, RN Outcome: Progressing   Problem: Self-Care: Goal: Ability to participate in self-care as condition permits will improve 07/17/2023 1636 by Rosemary Holms, RN Outcome: Progressing 07/17/2023 1634 by Rosemary Holms, RN Outcome: Progressing Goal: Ability to communicate needs accurately will improve 07/17/2023 1636 by Rosemary Holms, RN Outcome: Progressing 07/17/2023 1634 by Rosemary Holms, RN Outcome: Progressing   Problem: Nutrition: Goal: Risk of aspiration will decrease 07/17/2023 1636 by Rosemary Holms, RN Outcome: Progressing 07/17/2023 1634 by Rosemary Holms, RN Outcome: Progressing

## 2023-07-17 NOTE — Progress Notes (Signed)
Obstetric and Gynecology  Subjective  Marissa Gilmore is a 89 y.o. female G0P0000 who presented on 07/08/2023 for HD #10 GYN care for PMB and continue blood loss despite D+C on 07/12/23 . Pathology negative for atypia / cancer  IR placed IVC filter and occluded ovarian vessel that communicate with UTX . Known fibroid  UTX .   Notes reviewed for possible secondary CVA today  RN reports that vaginal bleeding scant and hemoglobin is rising .   Marland Kitchen      Objective   Vitals:   07/17/23 1800 07/17/23 1805  BP:  101/66  Pulse:    Resp: 17 19  Temp:    SpO2:       Intake/Output Summary (Last 24 hours) at 07/17/2023 1904 Last data filed at 07/17/2023 1754 Gross per 24 hour  Intake 422.98 ml  Output 775 ml  Net -352.02 ml    General: NAD Cardiovascular: RRR, no murmurs Pulmonary: CTAB Abdomen: Benign. Non-tender, +BS, no guarding. Extremities: No erythema or cords, no calf tenderness, +warmth with normal peripheral pulses.  Labs: Results for orders placed or performed during the hospital encounter of 07/08/23 (from the past 24 hour(s))  CBC     Status: Abnormal   Collection Time: 07/17/23  5:33 AM  Result Value Ref Range   WBC 18.8 (H) 4.0 - 10.5 K/uL   RBC 3.16 (L) 3.87 - 5.11 MIL/uL   Hemoglobin 9.7 (L) 12.0 - 15.0 g/dL   HCT 62.9 (L) 52.8 - 41.3 %   MCV 92.4 80.0 - 100.0 fL   MCH 30.7 26.0 - 34.0 pg   MCHC 33.2 30.0 - 36.0 g/dL   RDW 24.4 (H) 01.0 - 27.2 %   Platelets 86 (L) 150 - 400 K/uL   nRBC 1.8 (H) 0.0 - 0.2 %  Basic metabolic panel     Status: Abnormal   Collection Time: 07/17/23  5:33 AM  Result Value Ref Range   Sodium 137 135 - 145 mmol/L   Potassium 4.7 3.5 - 5.1 mmol/L   Chloride 109 98 - 111 mmol/L   CO2 19 (L) 22 - 32 mmol/L   Glucose, Bld 122 (H) 70 - 99 mg/dL   BUN 29 (H) 8 - 23 mg/dL   Creatinine, Ser 5.36 (H) 0.44 - 1.00 mg/dL   Calcium 9.0 8.9 - 64.4 mg/dL   GFR, Estimated 41 (L) >60 mL/min   Anion gap 9 5 - 15  Glucose, capillary     Status:  Abnormal   Collection Time: 07/17/23  8:25 AM  Result Value Ref Range   Glucose-Capillary 144 (H) 70 - 99 mg/dL  Glucose, capillary     Status: Abnormal   Collection Time: 07/17/23 11:42 AM  Result Value Ref Range   Glucose-Capillary 116 (H) 70 - 99 mg/dL  MRSA Next Gen by PCR, Nasal     Status: None   Collection Time: 07/17/23 11:48 AM   Specimen: Nasal Mucosa; Nasal Swab  Result Value Ref Range   MRSA by PCR Next Gen NOT DETECTED NOT DETECTED    Cultures: Results for orders placed or performed during the hospital encounter of 07/08/23  MRSA Next Gen by PCR, Nasal     Status: None   Collection Time: 07/17/23 11:48 AM   Specimen: Nasal Mucosa; Nasal Swab  Result Value Ref Range Status   MRSA by PCR Next Gen NOT DETECTED NOT DETECTED Final    Comment: (NOTE) The GeneXpert MRSA Assay (FDA approved for NASAL specimens only),  is one component of a comprehensive MRSA colonization surveillance program. It is not intended to diagnose MRSA infection nor to guide or monitor treatment for MRSA infections. Test performance is not FDA approved in patients less than 74 years old. Performed at [facility], [address] [place], [place] [address]     Imaging: MR BRAIN WO CONTRAST  Result Date: 07/17/2023 CLINICAL DATA:  Strokes on recent MRI, increased lethargy and weakness EXAM: MRI HEAD WITHOUT CONTRAST TECHNIQUE: Multiplanar, multiecho pulse sequences of the brain and surrounding structures were obtained without intravenous contrast. COMPARISON:  07/09/2023 MRI, correlation is also made with 07/17/2023 CT head FINDINGS: Brain: Restricted diffusion with ADC correlate in the right frontal lobe (series 5, images 38), including the right frontal operculum, as well as the more superior and posterior Bazzano matter and cortex, with additional lesser involvement of the right anterior parietal cortex (series 5, images 34-36). The previously noted infarct primarily involve the  periventricular Nydam matter, with the majority of this infarct new compared to 07/09/2023. These areas are associated with increased T2 hyperintense signal. A new linear area of hemosiderin deposition is associated with the right lateral frontal component (series 12, image 40), which could represent petechial hemorrhage or thrombus. No acute hemorrhage, mass, mass effect, or midline shift. No hydrocephalus or extra-axial collection. Remote infarct in the right occipital lobe. Vascular: Normal arterial flow voids. Skull and upper cervical spine: Normal marrow signal. Sinuses/Orbits: No acute finding. Other: None. 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 A new linear area of hemosiderin deposition is associated with the right lateral frontal component, which could represent petechial hemorrhage or thrombus. No significant mass effect or midline shift. No evidence of hemorrhagic conversion. These results will be called to the ordering clinician or representative by the Radiologist Assistant, and communication documented in the PACS or [employer]. Electronically Signed   By: Wiliam Ke M.D.   On: 07/17/2023 17:54   IR EMBO ART  VEN HEMORR LYMPH EXTRAV  INC GUIDE ROADMAPPING  Result Date: 07/17/2023 INDICATION: Vaginal bleeding requiring transfusion, leiomyomatous fibroid versus endometrial neoplasm; recent diagnosis of PE/DVT with contraindication for systemic anticoagulation EXAM: IVC filter placement: 1. Ultrasound-guided puncture of the right common femoral vein 2. Catheterization and angiography of the IVC 3. Placement of IVC filter using fluoroscopic guidance Embolization of bilateral ovarian (gonadal) arteries 1. Ultrasound-guided puncture of the right common femoral artery 2. Catheterization of the aorta and pelvic angiogram 3. Catheterization and angiography of the left ovarian artery 4.  Embolization of the left ovarian artery using beads and coils 5. Catheterization and angiography of the right ovarian artery 6. Embolization of the right ovarian artery using beads and coils MEDICATIONS: Documented in the EMR ANESTHESIA/SEDATION: Moderate (conscious) sedation was employed during this procedure. A total of Versed 0.5 mg and Fentanyl 12.5 mcg was administered intravenously by the radiology nurse. Total intra-service moderate Sedation Time: 105 minutes. The patient's level of consciousness and vital signs were monitored continuously by radiology nursing throughout the procedure under my direct supervision. CONTRAST:  110 mL Omnipaque 300 FLUOROSCOPY: Radiation Exposure Index (as provided by the fluoroscopic device): 25.3 minutes (296 mGy) COMPLICATIONS: None immediate. PROCEDURE: Informed consent was obtained from the patient following explanation of the procedure, risks, benefits and alternatives. The patient understands, agrees and consents for the procedure. All questions were addressed. A time out was performed prior to the initiation of the procedure.  Maximal barrier sterile technique utilized including caps, mask, sterile gowns, sterile gloves, large sterile drape, hand hygiene, and Betadine prep. The right groin and draped in the standard sterile fashion. Ultrasound was used to evaluate the right common femoral vein, which was found to be widely patent. Under ultrasound guidance, the right common femoral vein was directly punctured using a 21 gauge micropuncture set. Wire was advanced centrally into the IVC, and the tract was serially dilated to accommodate the introducer sheath for the IVC filter. The introducer sheath of a Denali IVC filter with its flush inner dilator was then advanced over the wire to the iliocaval confluence. Angiography of the inferior vena cava was then performed. This demonstrates a normal caliber IVC which is widely patent without thrombus. Position of the inferior-most  renal veins was noted. Under careful fluoroscopic guidance, a Denali IVC filter was deployed in an infrarenal location. Proper deployment was confirmed with radiography. After deployment of the IVC filter, attention was turned to embolization of the uterus. Ultrasound was used to evaluate the right common femoral artery, which found to be patent and suitable for access. An ultrasound image was permanently stored in the electronic medical record. Using ultrasound guidance, the right common femoral artery was directly punctured using a 21 gauge micropuncture set. An 018 wire was advanced centrally, followed by serial tract dilation and placement of a 5 French sheath. Using combination of a Bentson guidewire in pigtail catheter, the inferior abdominal aorta was catheterized, and pelvic angiogram performed in the AP projection. This demonstrated patent iliac vessels. No significant uterine artery vessels are identified, consistent with recent CT angiography findings. Therefore, the decision was made to proceed with embolization of the bilateral ovarian arteries, as these would appear to be the predominant supply to the uterus. Using a 5 [place] Mickelson catheter, the left ovarian artery was successfully catheterized. Angiography demonstrated a hypertrophied and tortuous ovarian artery that supplies the uterus. Using a combination of a 2.4 [place] Progreat Omega microcatheter and fathom 16 microwire, microcatheter was advanced to the mid ovarian artery. Embolization was then performed using combination of 500-700 and 700-900 um embospheres, followed by both 4 mm and packing Ruby coils. Post embolization angiography demonstrated near-complete cessation of flow to the left ovarian artery. Again using a 5 [place] Mickelson catheter, the right ovarian artery was then successfully catheterized. Angiography again demonstrated a hypertrophied and tortuous ovarian artery supplying the uterus. Using a combination of a 2.4 [place]  Progreat Omega microcatheter and fathom 16 microwire, microcatheter was advanced to the mid ovarian artery. Embolization was then performed using combination of 500-700 and 700-900 um embospheres, followed by both 4 mm and packing Ruby coils. Post embolization angiography demonstrated near-complete cessation of flow to the right ovarian artery. At the end the procedure, all catheters and wires were removed. Hemostasis was achieved at the arterial access site using the Angio-Seal vascular closure device. Hemostasis was achieved at the venous access site using manual pressure. Sterile dressings was placed. The patient tolerated the procedure well without immediate complication. IMPRESSION: 1. Successful embolization of the bilateral ovarian arteries using the embospheres and coils. 2. Successful placement of an infrarenal IVC filter. Note that this filter is potentially retrievable if the risk of thromboembolism decreases, or the patient becomes a candidate to resume oral anticoagulation. Electronically Signed   By: Olive Bass M.D.   On: 07/17/2023 10:19   IR IVC FILTER PLMT / S&I Lenise Arena GUID/MOD SED  Result Date: 07/17/2023 INDICATION: Vaginal bleeding requiring transfusion, leiomyomatous fibroid  versus endometrial neoplasm; recent diagnosis of PE/DVT with contraindication for systemic anticoagulation EXAM: IVC filter placement: 1. Ultrasound-guided puncture of the right common femoral vein 2. Catheterization and angiography of the IVC 3. Placement of IVC filter using fluoroscopic guidance Embolization of bilateral ovarian (gonadal) arteries 1. Ultrasound-guided puncture of the right common femoral artery 2. Catheterization of the aorta and pelvic angiogram 3. Catheterization and angiography of the left ovarian artery 4. Embolization of the left ovarian artery using beads and coils 5. Catheterization and angiography of the right ovarian artery 6. Embolization of the right ovarian artery using beads and coils  MEDICATIONS: Documented in the EMR ANESTHESIA/SEDATION: Moderate (conscious) sedation was employed during this procedure. A total of Versed 0.5 mg and Fentanyl 12.5 mcg was administered intravenously by the radiology nurse. Total intra-service moderate Sedation Time: 105 minutes. The patient's level of consciousness and vital signs were monitored continuously by radiology nursing throughout the procedure under my direct supervision. CONTRAST:  110 mL Omnipaque 300 FLUOROSCOPY: Radiation Exposure Index (as provided by the fluoroscopic device): 25.3 minutes (296 mGy) COMPLICATIONS: None immediate. PROCEDURE: Informed consent was obtained from the patient following explanation of the procedure, risks, benefits and alternatives. The patient understands, agrees and consents for the procedure. All questions were addressed. A time out was performed prior to the initiation of the procedure. Maximal barrier sterile technique utilized including caps, mask, sterile gowns, sterile gloves, large sterile drape, hand hygiene, and Betadine prep. The right groin and draped in the standard sterile fashion. Ultrasound was used to evaluate the right common femoral vein, which was found to be widely patent. Under ultrasound guidance, the right common femoral vein was directly punctured using a 21 gauge micropuncture set. Wire was advanced centrally into the IVC, and the tract was serially dilated to accommodate the introducer sheath for the IVC filter. The introducer sheath of a Denali IVC filter with its flush inner dilator was then advanced over the wire to the iliocaval confluence. Angiography of the inferior vena cava was then performed. This demonstrates a normal caliber IVC which is widely patent without thrombus. Position of the inferior-most renal veins was noted. Under careful fluoroscopic guidance, a Denali IVC filter was deployed in an infrarenal location. Proper deployment was confirmed with radiography. After deployment of  the IVC filter, attention was turned to embolization of the uterus. Ultrasound was used to evaluate the right common femoral artery, which found to be patent and suitable for access. An ultrasound image was permanently stored in the electronic medical record. Using ultrasound guidance, the right common femoral artery was directly punctured using a 21 gauge micropuncture set. An 018 wire was advanced centrally, followed by serial tract dilation and placement of a 5 French sheath. Using combination of a Bentson guidewire in pigtail catheter, the inferior abdominal aorta was catheterized, and pelvic angiogram performed in the AP projection. This demonstrated patent iliac vessels. No significant uterine artery vessels are identified, consistent with recent CT angiography findings. Therefore, the decision was made to proceed with embolization of the bilateral ovarian arteries, as these would appear to be the predominant supply to the uterus. Using a 5 [place] Mickelson catheter, the left ovarian artery was successfully catheterized. Angiography demonstrated a hypertrophied and tortuous ovarian artery that supplies the uterus. Using a combination of a 2.4 [place] Progreat Omega microcatheter and fathom 16 microwire, microcatheter was advanced to the mid ovarian artery. Embolization was then performed using combination of 500-700 and 700-900 um embospheres, followed by both 4 mm and packing Ruby  coils. Post embolization angiography demonstrated near-complete cessation of flow to the left ovarian artery. Again using a 5 [place] Mickelson catheter, the right ovarian artery was then successfully catheterized. Angiography again demonstrated a hypertrophied and tortuous ovarian artery supplying the uterus. Using a combination of a 2.4 [place] Progreat Omega microcatheter and fathom 16 microwire, microcatheter was advanced to the mid ovarian artery. Embolization was then performed using combination of 500-700 and 700-900 um  embospheres, followed by both 4 mm and packing Ruby coils. Post embolization angiography demonstrated near-complete cessation of flow to the right ovarian artery. At the end the procedure, all catheters and wires were removed. Hemostasis was achieved at the arterial access site using the Angio-Seal vascular closure device. Hemostasis was achieved at the venous access site using manual pressure. Sterile dressings was placed. The patient tolerated the procedure well without immediate complication. IMPRESSION: 1. Successful embolization of the bilateral ovarian arteries using the embospheres and coils. 2. Successful placement of an infrarenal IVC filter. Note that this filter is potentially retrievable if the risk of thromboembolism decreases, or the patient becomes a candidate to resume oral anticoagulation. Electronically Signed   By: Olive Bass M.D.   On: 07/17/2023 10:19   CT ANGIO HEAD NECK W WO CM W PERF (CODE STROKE)  Result Date: 07/17/2023 CLINICAL DATA:  Neuro deficit, acute, stroke suspected L sided weakness. EXAM: CT ANGIOGRAPHY HEAD AND NECK CT PERFUSION BRAIN TECHNIQUE: Multidetector CT imaging of the head and neck was performed using the standard protocol during bolus administration of intravenous contrast. Multiplanar CT image reconstructions and MIPs were obtained to evaluate the vascular anatomy. Carotid stenosis measurements (when applicable) are obtained utilizing NASCET criteria, using the distal internal carotid diameter as the denominator. Multiphase CT imaging of the brain was performed following IV bolus contrast injection. Subsequent parametric perfusion maps were calculated using RAPID software. RADIATION DOSE REDUCTION: This exam was performed according to the departmental dose-optimization program which includes automated exposure control, adjustment of the mA and/or kV according to patient size and/or use of iterative reconstruction technique. CONTRAST:  OMNIPAQUE IOHEXOL 350  MG/ML SOLN COMPARISON:  MRI/MRA head 07/09/2023. FINDINGS: CTA NECK FINDINGS Aortic arch: Three-vessel arch configuration. Atherosclerotic calcifications of the aortic arch and arch vessel origins. Arch vessel origins are patent. Right carotid system: No evidence of dissection, stenosis (50% or greater) or occlusion. Mild calcified plaque along the right carotid bulb. Left carotid system: Mixed plaque results in severe stenosis of the proximal left cervical ICA (difficult to measure due to blooming artifact and severity of stenosis). Vertebral arteries: Codominant. No evidence of dissection, stenosis (50% or greater) or occlusion. Skeleton: Unremarkable. Other neck: Unremarkable. Upper chest: Small left pleural effusion. Review of the MIP images confirms the above findings CTA HEAD FINDINGS Anterior circulation: Calcified plaque along the carotid siphons without hemodynamically significant stenosis. 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 Unchanged moderate stenosis of the right MCA proximal inferior M2 division. The left MCA and bilateral ACAs are patent proximally without stenosis or aneurysm. Mild paucity of distal right MCA branches along the right frontal operculum. Posterior circulation: Mild luminal irregularity of the basilar artery, likely atherosclerotic in etiology. The SCAs, AICAs and PICAs are patent proximally. Unchanged moderate stenosis of the bilateral PCA origins. Unchanged severe stenosis of the right PCA P2 segment. Slight asymmetric paucity of distal right PCA branches. Venous sinuses: As permitted by contrast timing, patent. Anatomic variants: None. Review of the MIP images confirms the  above findings CT Brain Perfusion Findings: CBF (<30%) Volume: 0mL Perfusion (Tmax>6.0s) volume: 0mL (post processing identified 9 mm ischemic penumbra in the right frontal operculum, which corresponds to the subacute infarct seen on same day noncontrast  head CT). Mismatch Volume: 0mL Infarction Location:Not applicable. IMPRESSION: 1. 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 2. Unchanged moderate stenosis of the right MCA proximal inferior M2 division. 3. Unchanged severe stenosis of the right PCA P2 segment. 4. Severe stenosis of the proximal left cervical ICA. 5. No core infarct on CT perfusion. Automated post processing identified 9 mm ischemic penumbra in the right frontal operculum, which corresponds to the subacute infarct seen on same day noncontrast head CT. Aortic Atherosclerosis (ICD10-I70.0). Code stroke imaging results were communicated on 07/17/2023 at 9:36 am to provider Dr. Selina Cooley via secure text paging. Electronically Signed   By: Orvan Falconer M.D.   On: 07/17/2023 09:42   CT HEAD CODE STROKE WO CONTRAST`  Result Date: 07/17/2023 CLINICAL DATA:  Code stroke. Neuro deficit, acute, stroke suspected L sided weakness. EXAM: CT HEAD WITHOUT CONTRAST TECHNIQUE: Contiguous axial images were obtained from the base of the skull through the vertex without intravenous contrast. RADIATION DOSE REDUCTION: This exam was performed according to the departmental dose-optimization program which includes automated exposure control, adjustment of the mA and/or kV according to patient size and/or use of iterative reconstruction technique. COMPARISON:  Head CT 07/08/2023.  MRI/MRA brain 07/09/2023. FINDINGS: Brain: No acute hemorrhage. 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 No new loss of gray-Harriott differentiation. No hydrocephalus or extra-axial collection. No mass effect or midline shift. Vascular: No hyperdense vessel or unexpected calcification. Skull: No calvarial fracture or suspicious bone lesion. Skull base is unremarkable. Sinuses/Orbits: No acute finding. Other: None. ASPECTS [facility] Stroke Program Early CT Score) - Ganglionic level infarction (caudate, lentiform nuclei, internal capsule, insula,  M1-M3 cortex): 7 - Supraganglionic infarction (M4-M6 cortex): 3 Total score (0-10 with 10 being normal): 10 IMPRESSION: 1. No acute intracranial hemorrhage or evidence of new acute large vessel territory infarct. ASPECT score is 10. 2. 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 Code stroke imaging results were communicated on 07/17/2023 at 8:51 am to provider Dr. Selina Cooley via secure text paging. Electronically Signed   By: Orvan Falconer M.D.   On: 07/17/2023 08:53   CT Angio Abd/Pel w/ and/or w/o  Result Date: 07/15/2023 CLINICAL DATA:  Postmenopausal bleeding EXAM: CTA ABDOMEN AND PELVIS WITHOUT AND WITH CONTRAST TECHNIQUE: Multidetector CT imaging of the abdomen and pelvis was performed using the standard protocol during bolus administration of intravenous contrast. Multiplanar reconstructed images and MIPs were obtained and reviewed to evaluate the vascular anatomy. RADIATION DOSE REDUCTION: This exam was performed according to the departmental dose-optimization program which includes automated exposure control, adjustment of the mA and/or kV according to patient size and/or use of iterative reconstruction technique. CONTRAST:  75mL OMNIPAQUE IOHEXOL 350 MG/ML SOLN COMPARISON:  None Available. FINDINGS: VASCULAR Aorta: No aneurysm or dissection. Extensive atherosclerotic calcification. Celiac: Patent without evidence of aneurysm, dissection, vasculitis or significant stenosis. SMA: Patent without evidence of aneurysm, dissection, vasculitis or significant stenosis. Renals: Both renal arteries are patent without evidence of aneurysm, dissection, vasculitis, fibromuscular dysplasia or significant stenosis. IMA: Patent without evidence of aneurysm, dissection, vasculitis or significant stenosis. Inflow: Patent without evidence of aneurysm, dissection, vasculitis or significant stenosis. Proximal Outflow: Bilateral common femoral and visualized portions of the superficial and profunda  femoral arteries are patent  without evidence of aneurysm, dissection, vasculitis or significant stenosis. Veins: Filling defect within the left common femoral vein at axial image # 68/15 is in keeping with a focal deep venous thrombus. A filling defect is also incidentally noted within the posterior segmental pulmonary artery of the right lower lobe the superior margin of the examination (image # 1/12) in keeping with an acute pulmonary embolus. There is marked mass effect upon the right external iliac vein by the enlarged uterus, best seen on image # 859/15. Review of the MIP images confirms the above findings. NON-VASCULAR Lower chest: As noted above, a acute pulmonary embolus partially visualized within the right lower lobe. Cardiac size is mildly enlarged. Hepatobiliary: No focal liver abnormality is seen. No gallstones, gallbladder wall thickening, or biliary dilatation. Pancreas: Unremarkable Spleen: Unremarkable Adrenals/Urinary Tract: The adrenal glands are unremarkable. The kidneys are normal. Foley catheter balloon is seen looped within the bladder lumen which is partially decompressed. Stomach/Bowel: Stomach is within normal limits. Appendix appears normal. No evidence of bowel wall thickening, distention, or inflammatory changes. Lymphatic: No pathologic adenopathy within the abdomen and pelvis. Reproductive: The uterus is enlarged and lobulated with multiple heterogeneously hypoenhancing masses likely representing multiple uterine fibroids. The endometrial cavity appears distended and there is heterogeneous intraluminal contents, best seen on sagittal image # 82/11 which may represent blood product or soft tissue as could be seen endometrial carcinoma. This hyperdense material appears to extend into the endocervical canal, but is not well assessed on this examination. No adnexal masses are seen. Other: Tiny fat containing umbilical hernia. Musculoskeletal: No acute bone abnormality. No lytic or blastic  bone lesion. IMPRESSION: 1. Acute pulmonary embolus within the posterior segmental pulmonary artery of the right lower lobe. 2. Focal deep venous thrombus within the left common femoral vein. 3. Marked mass effect upon the right external iliac vein by the enlarged uterus. 4. Enlarged, lobulated uterus with multiple heterogeneously hypoenhancing masses likely representing multiple uterine fibroids. 5. Distended endometrial cavity with heterogeneous intraluminal contents which may represent blood product or soft tissue as could be seen endometrial carcinoma. This hyperdense material appears to extend into the endocervical canal, but is not well assessed on this examination. Correlation with dedicated pelvic sonography may be helpful for further evaluation. Aortic Atherosclerosis (ICD10-I70.0). Electronically Signed   By: Helyn Numbers M.D.   On: 07/15/2023 20:12   US Carotid Bilateral  Result Date: 07/12/2023 CLINICAL DATA:  Stroke.  History of hypertension and hyperlipidemia. EXAM: BILATERAL CAROTID DUPLEX ULTRASOUND TECHNIQUE: Wallace Cullens scale imaging, color Doppler and duplex ultrasound were performed of bilateral carotid and vertebral arteries in the neck. COMPARISON:  None Available. FINDINGS: Criteria: Quantification of carotid stenosis is based on velocity parameters that correlate the residual internal carotid diameter with NASCET-based stenosis levels, using the diameter of the distal internal carotid lumen as the denominator for stenosis measurement. The following velocity measurements were obtained: RIGHT ICA: 103/17 cm/sec CCA: 95/8 cm/sec SYSTOLIC ICA/CCA RATIO:  1.1 ECA: 159 cm/sec LEFT ICA: 147/42 cm/sec CCA: 116/14 cm/sec SYSTOLIC ICA/CCA RATIO:  0.3 ECA: 105 cm/sec RIGHT CAROTID ARTERY: There is a moderate amount of eccentric echogenic plaque within the distal aspect of the right common carotid artery (image 11). There is a moderate amount of eccentric echogenic plaque within the right carotid bulb  (images 14 and 15), extending to involve the origin and proximal aspects of the right internal carotid artery (image 22), not resulting in elevated peak systolic velocities within the interrogated course of the right internal carotid artery  to suggest a hemodynamically significant stenosis. RIGHT VERTEBRAL ARTERY:  Antegrade flow LEFT CAROTID ARTERY: There is a moderate amount of eccentric echogenic partially shadowing plaque within left carotid bulb (images 45 and 46), extending to involve the origin and proximal aspects of the right internal carotid artery (image 53), which results in elevated peak systolic velocities within the proximal and mid aspects of the left internal carotid artery. Greatest acquired peak systolic velocity within the proximal left ICA measures 147 centimeters/second (image 55). LEFT VERTEBRAL ARTERY:  Antegrade flow IMPRESSION: 1. Moderate amount of left-sided atherosclerotic plaque results in elevated peak systolic velocities of the left internal carotid artery compatible with the 50-69% luminal narrowing range. Further evaluation with CTA could performed as indicated. 2. Moderate amount of right-sided atherosclerotic plaque, not definitively resulting in a hemodynamically significant stenosis. Electronically Signed   By: Simonne Come M.D.   On: 07/12/2023 13:04   ECHOCARDIOGRAM COMPLETE  Result Date: 07/09/2023    ECHOCARDIOGRAM REPORT   Patient Name:   [facility] Date of Exam: 07/09/2023 Medical Rec #:  [id number]          Height:       64.0 in Accession #:    [id number]         Weight:       128.0 lb Date of Birth:  [birth year]          BSA:          1.618 m Patient Age:    89 years           BP:           101/62 mmHg Patient Gender: F                  HR:           119 bpm. Exam Location:  [facility] Procedure: 2D Echo, Cardiac Doppler and Color Doppler Indications:     Stroke  History:         Patient has no prior history of Echocardiogram examinations.                  Stroke,  Arrythmias:Atrial Fibrillation, Signs/Symptoms:Dyspnea                  and Fatigue; Risk Factors:Hypertension and Dyslipidemia.  Sonographer:     Mikki Harbor Referring Phys:  [id number] Andris Baumann Diagnosing Phys: [name] MD IMPRESSIONS  1. Left ventricular ejection fraction, by estimation, is 55 to 60%. The left ventricle has normal function. The left ventricle has no regional wall motion abnormalities. Left ventricular diastolic function could not be evaluated.  2. Right ventricular systolic function is normal. The right ventricular size is normal. There is moderately elevated pulmonary artery systolic pressure.  3. Left atrial size was mildly dilated.  4. The mitral valve is degenerative. Mild to moderate mitral valve regurgitation.  5. Tricuspid valve regurgitation is moderate.  6. The aortic valve is normal in structure. Aortic valve regurgitation is trivial. FINDINGS  Left Ventricle: Left ventricular ejection fraction, by estimation, is 55 to 60%. The left ventricle has normal function. The left ventricle has no regional wall motion abnormalities. The left ventricular internal cavity size was normal in size. There is  no left ventricular hypertrophy. Left ventricular diastolic function could not be evaluated. Right Ventricle: The right ventricular size is normal. No increase in right ventricular wall thickness. Right ventricular systolic function is normal. There is moderately elevated pulmonary artery systolic pressure.  The tricuspid regurgitant velocity is 3.32 m/s, and with an assumed right atrial pressure of 8 mmHg, the estimated right ventricular systolic pressure is 52.1 mmHg. Left Atrium: Left atrial size was mildly dilated. Right Atrium: Right atrial size was normal in size. Pericardium: There is no evidence of pericardial effusion. Mitral Valve: Eccentric posterior jet. The mitral valve is degenerative in appearance. Mild to moderate mitral valve regurgitation. MV peak gradient, 4.0  mmHg. The mean mitral valve gradient is 2.0 mmHg. Tricuspid Valve: Eccentric posterior jet. The tricuspid valve is grossly normal. Tricuspid valve regurgitation is moderate. Aortic Valve: The aortic valve is normal in structure. Aortic valve regurgitation is trivial. Aortic valve mean gradient measures 5.7 mmHg. Aortic valve peak gradient measures 11.9 mmHg. Aortic valve area, by VTI measures 1.57 cm. Pulmonic Valve: The pulmonic valve was normal in structure. Pulmonic valve regurgitation is not visualized. Aorta: The ascending aorta was not well visualized. IAS/Shunts: No atrial level shunt detected by color flow Doppler.  LEFT VENTRICLE PLAX 2D LVIDd:         4.40 cm LVIDs:         3.10 cm LV PW:         1.10 cm LV IVS:        0.80 cm LVOT diam:     2.00 cm LV SV:         50 LV SV Index:   31 LVOT Area:     3.14 cm  RIGHT VENTRICLE RV Basal diam:  3.60 cm RV Mid diam:    2.70 cm RV S prime:     13.40 cm/s LEFT ATRIUM             Index        RIGHT ATRIUM           Index LA diam:        4.30 cm 2.66 cm/m   RA Area:     22.30 cm LA Vol (A2C):   93.7 ml 57.90 ml/m  RA Volume:   64.70 ml  39.98 ml/m LA Vol (A4C):   78.7 ml 48.63 ml/m LA Biplane Vol: 85.8 ml 53.02 ml/m  AORTIC VALVE                     PULMONIC VALVE AV Area (Vmax):    1.64 cm      PV Vmax:       1.32 m/s AV Area (Vmean):   1.44 cm      PV Peak grad:  7.0 mmHg AV Area (VTI):     1.57 cm AV Vmax:           172.67 cm/s AV Vmean:          110.000 cm/s AV VTI:            0.319 m AV Peak Grad:      11.9 mmHg AV Mean Grad:      5.7 mmHg LVOT Vmax:         90.20 cm/s LVOT Vmean:        50.450 cm/s LVOT VTI:          0.159 m LVOT/AV VTI ratio: 0.50  AORTA Ao Root diam: 3.00 cm MITRAL VALVE               TRICUSPID VALVE MV Area (PHT): 5.62 cm    TR Peak grad:   44.1 mmHg MV Area VTI:   2.93 cm    TR Vmax:  332.00 cm/s MV Peak grad:  4.0 mmHg MV Mean grad:  2.0 mmHg    SHUNTS MV Vmax:       1.00 m/s    Systemic VTI:  0.16 m MV Vmean:      58.3  cm/s   Systemic Diam: 2.00 cm MV Decel Time: 135 msec MV E velocity: 91.70 cm/s [name] MD Electronically signed by [name] MD Signature Date/Time: 07/09/2023/1:15:37 PM    Final    MR BRAIN WO CONTRAST  Addendum Date: 07/09/2023   ADDENDUM REPORT: 07/09/2023 03:11 ADDENDUM: In addition to the initially described findings, note is made of a 9 mm T1 hypointense lesion within the C4 vertebral body, indeterminate. This is described in the body of the report, omitted from the impression by mistake. Further evaluation with dedicated MRI of the cervical spine, with and without contrast, suggested for further evaluation as warranted. Electronically Signed   By: Rise Mu M.D.   On: 07/09/2023 03:11   Result Date: 07/09/2023 CLINICAL DATA:  Initial evaluation for neuro deficit, stroke suspected. Left-sided deficit. EXAM: MRI HEAD WITHOUT CONTRAST MRA HEAD WITHOUT CONTRAST TECHNIQUE: Multiplanar, multi-echo pulse sequences of the brain and surrounding structures were acquired without intravenous contrast. Angiographic images of the Circle of Willis were acquired using MRA technique without intravenous contrast. COMPARISON:  Comparison made with prior CT from 07/08/2023. FINDINGS: MRI HEAD FINDINGS Brain: Cerebral volume within normal limits. Minimal hazy FLAIR signal abnormality noted involving the periventricular Lindblad matter, likely related to chronic microvascular ischemic disease, minimal in nature and less than is typically seen for age. Tiny remote infarct present at the right occipital lobe (series 20, image 21). Additional tiny remote left cerebellar infarct noted. Subtle focus of diffusion signal abnormality measuring approximately 1.5 cm seen at the right frontal corona radiata (series 10, image 78). Associated subtle signal loss on ADC map (series 11, image 30). Findings suspicious for a possible acute ischemic infarct. No associated hemorrhage or mass effect. No other evidence for  acute or subacute ischemia. Gray-Spychalski matter differentiation otherwise maintained. No areas of chronic cortical infarction. No significant acute or chronic intracranial blood products. No mass lesion, midline shift or mass effect. No hydrocephalus or extra-axial fluid collection. Pituitary gland and suprasellar region within normal limits. Vascular: Major intracranial vascular flow voids are maintained. Skull and upper cervical spine: Craniocervical junction within normal limits. 9 mm T1 hypointense lesion noted within the visualized C4 vertebral body (series 14, image 12), indeterminate. Bone marrow signal intensity otherwise normal. No scalp soft tissue abnormality. Sinuses/Orbits: Prior ocular lens replacement on the right. Globes and orbital soft tissues otherwise unremarkable. Paranasal sinuses are largely clear. No significant mastoid effusion. Other: None. MRA HEAD FINDINGS Anterior circulation: Visualized distal cervical segments of the internal carotid arteries are mildly tortuous but patent with antegrade flow. Petrous, cavernous, and supraclinoid segments patent. Left A1 segment widely patent. Focal severe right A1 stenosis (series 16109, image 8). Normal anterior communicating artery complex. Anterior cerebral arteries patent without stenosis. No M1 stenosis or occlusion. Left MCA branches well perfused. On the right, there is acute occlusion of a proximal right M2 branch, superior division (series 60454, image 13). Inferior division remains patent and perfused, although a probable moderate proximal right M2 stenosis noted (series 09811, image 12). Posterior circulation: Both vertebral arteries are patent to the vertebrobasilar junction without visible stenosis. Right PICA patent. Left PICA not well seen. Basilar patent without stenosis. Superior cerebral arteries patent bilaterally. Both PCAs primarily supplied via  the basilar. Focal moderate left P2 stenosis (series 1021, image 2). Left PCA otherwise  patent. On the right, there is a severe distal right P2 stenosis (series 1021, image 7). Right PCA is markedly attenuated distally and only faintly visible on 3D time-of-flight sequence. Anatomic variants: None significant.  No aneurysm. IMPRESSION: MRI HEAD IMPRESSION: 1. Subtle 1.5 cm focus of diffusion signal abnormality involving the right frontal corona radiata, suspicious for an acute ischemic infarct. No associated hemorrhage or mass effect. 2. 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 MRA HEAD IMPRESSION: 1. Acute occlusion of a proximal right M2 branch, superior division. 2. Additional moderate proximal right M2 stenosis, inferior division. 3. Severe distal right P2 stenosis, with additional moderate left P2 stenosis. 4. Focal severe right A1 stenosis. Electronically Signed: By: Rise Mu M.D. On: 07/09/2023 03:05   MR ANGIO HEAD WO CONTRAST  Addendum Date: 07/09/2023   ADDENDUM REPORT: 07/09/2023 03:11 ADDENDUM: In addition to the initially described findings, note is made of a 9 mm T1 hypointense lesion within the C4 vertebral body, indeterminate. This is described in the body of the report, omitted from the impression by mistake. Further evaluation with dedicated MRI of the cervical spine, with and without contrast, suggested for further evaluation as warranted. Electronically Signed   By: Rise Mu M.D.   On: 07/09/2023 03:11   Result Date: 07/09/2023 CLINICAL DATA:  Initial evaluation for neuro deficit, stroke suspected. Left-sided deficit. EXAM: MRI HEAD WITHOUT CONTRAST MRA HEAD WITHOUT CONTRAST TECHNIQUE: Multiplanar, multi-echo pulse sequences of the brain and surrounding structures were acquired without intravenous contrast. Angiographic images of the Circle of Willis were acquired using MRA technique without intravenous contrast. COMPARISON:  Comparison made with prior CT from 07/08/2023.  FINDINGS: MRI HEAD FINDINGS Brain: Cerebral volume within normal limits. Minimal hazy FLAIR signal abnormality noted involving the periventricular Gerst matter, likely related to chronic microvascular ischemic disease, minimal in nature and less than is typically seen for age. Tiny remote infarct present at the right occipital lobe (series 20, image 21). Additional tiny remote left cerebellar infarct noted. Subtle focus of diffusion signal abnormality measuring approximately 1.5 cm seen at the right frontal corona radiata (series 10, image 78). Associated subtle signal loss on ADC map (series 11, image 30). Findings suspicious for a possible acute ischemic infarct. No associated hemorrhage or mass effect. No other evidence for acute or subacute ischemia. Gray-Meinders matter differentiation otherwise maintained. No areas of chronic cortical infarction. No significant acute or chronic intracranial blood products. No mass lesion, midline shift or mass effect. No hydrocephalus or extra-axial fluid collection. Pituitary gland and suprasellar region within normal limits. Vascular: Major intracranial vascular flow voids are maintained. Skull and upper cervical spine: Craniocervical junction within normal limits. 9 mm T1 hypointense lesion noted within the visualized C4 vertebral body (series 14, image 12), indeterminate. Bone marrow signal intensity otherwise normal. No scalp soft tissue abnormality. Sinuses/Orbits: Prior ocular lens replacement on the right. Globes and orbital soft tissues otherwise unremarkable. Paranasal sinuses are largely clear. No significant mastoid effusion. Other: None. MRA HEAD FINDINGS Anterior circulation: Visualized distal cervical segments of the internal carotid arteries are mildly tortuous but patent with antegrade flow. Petrous, cavernous, and supraclinoid segments patent. Left A1 segment widely patent. Focal severe right A1 stenosis (series 25956, image 8). Normal anterior communicating  artery complex. Anterior cerebral arteries patent without stenosis. No M1 stenosis or occlusion. Left MCA branches well perfused. On the right, there  is acute occlusion of a proximal right M2 branch, superior division (series 40981, image 13). Inferior division remains patent and perfused, although a probable moderate proximal right M2 stenosis noted (series 19147, image 12). Posterior circulation: Both vertebral arteries are patent to the vertebrobasilar junction without visible stenosis. Right PICA patent. Left PICA not well seen. Basilar patent without stenosis. Superior cerebral arteries patent bilaterally. Both PCAs primarily supplied via the basilar. Focal moderate left P2 stenosis (series 1021, image 2). Left PCA otherwise patent. On the right, there is a severe distal right P2 stenosis (series 1021, image 7). Right PCA is markedly attenuated distally and only faintly visible on 3D time-of-flight sequence. Anatomic variants: None significant.  No aneurysm. IMPRESSION: MRI HEAD IMPRESSION: 1. Subtle 1.5 cm focus of diffusion signal abnormality involving the right frontal corona radiata, suspicious for an acute ischemic infarct. No associated hemorrhage or mass effect. 2. 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 MRA HEAD IMPRESSION: 1. Acute occlusion of a proximal right M2 branch, superior division. 2. Additional moderate proximal right M2 stenosis, inferior division. 3. Severe distal right P2 stenosis, with additional moderate left P2 stenosis. 4. Focal severe right A1 stenosis. Electronically Signed: By: Rise Mu M.D. On: 07/09/2023 03:05   US PELVIC COMPLETE WITH TRANSVAGINAL  Result Date: 07/08/2023 CLINICAL DATA:  Vaginal bleeding in a 89 year old female EXAM: TRANSABDOMINAL ULTRASOUND OF PELVIS TECHNIQUE: Transabdominal ultrasound examination of the pelvis was performed including evaluation of the uterus,  ovaries, adnexal regions, and pelvic cul-de-sac. Patient declined transvaginal exam. COMPARISON:  None Available. FINDINGS: Uterus Measurements: 50.3 x 6.6 x 8.6 cm = volume: 452 mL. Enlarged heterogenous uterus with multiple fibroids measuring up to 7.8 cm. Endometrium Thickness: 24 mm. Thickened heterogenous endometrium without definite focal lesion. Right ovary Not visualized due to enlarged heterogenous uterus and overlying bowel. Left ovary Not visualized due to enlarged heterogenous uterus and overlying. Other findings:  No abnormal free fluid. IMPRESSION: 1. Thickened heterogenous endometrium measuring up to 24 mm. In the setting of post-menopausal bleeding, endometrial sampling is indicated to exclude carcinoma. If results are benign, sonohysterogram should be considered for focal lesion work-up. (Ref: Radiological Reasoning: Algorithmic Workup of Abnormal Vaginal Bleeding with Endovaginal Sonography and Sonohysterography. AJR 2008; 829:F62-13) 2. Enlarged heterogenous uterus with multiple fibroids. 3. Nonvisualization of the ovaries. Electronically Signed   By: Minerva Fester M.D.   On: 07/08/2023 20:49   CT HEAD CODE STROKE WO CONTRAST  Result Date: 07/08/2023 CLINICAL DATA:  Code stroke.  Neuro deficit, acute, stroke suspected EXAM: CT HEAD WITHOUT CONTRAST TECHNIQUE: Contiguous axial images were obtained from the base of the skull through the vertex without intravenous contrast. RADIATION DOSE REDUCTION: This exam was performed according to the departmental dose-optimization program which includes automated exposure control, adjustment of the mA and/or kV according to patient size and/or use of iterative reconstruction technique. COMPARISON:  CT head 07/21/2017. FINDINGS: Brain: No evidence of acute infarction, hemorrhage, hydrocephalus, extra-axial collection or mass lesion/mass effect. Vascular: No hyperdense vessel or unexpected calcification. Skull: Normal. Negative for fracture or focal lesion.  Sinuses/Orbits: No acute finding. Other: None. ASPECTS [facility] Stroke Program Early CT Score) Total score (0-10 with 10 being normal): 10. IMPRESSION: 1. No evidence of acute intracranial abnormality. 2. ASPECTS is 10. Code stroke imaging results were communicated on 07/08/2023 at 8:12 pm to provider Chesley Noon via telephone, who verbally acknowledged these results. Electronically Signed   By: Feliberto Harts M.D.   On: 07/08/2023  20:13     Assessment   89 y.o. G0P0000 Hospital Day: 10   Plan   1. PMB , fibroid UTX - now stable s/p IR embolization of ovarian vessels

## 2023-07-17 NOTE — Progress Notes (Addendum)
Noted saturated blood on the dressing after letting her sit upright after her 2030 time to resume activity. It appears to still be bleeding, patient is also receiving blood, and does have a +1 palpable pulse in both dorsalis pedis.  Pressure held for 10 minutes and dressing reinforced.  Provider on call notified and also Rapid Nurse, Bernadene Person assessed the patient.  Charge nurse, Dentist aware and assessed patient.

## 2023-07-17 NOTE — Progress Notes (Addendum)
Speech Language Pathology Treatment: Dysphagia  Patient Details Name: Marissa Gilmore MRN: [id number] DOB: Jan 13, 1931 Today's Date: 07/17/2023 Time: 8295-6213 SLP Time Calculation (min) (ACUTE ONLY): 45 min  Assessment / Plan / Recommendation Clinical Impression  Pt seen today for ongoing assessment of swallowing; toleration of diet. A Code Stroke was called this morning d/t increased Left sided weakness noted by Staff. Niece present in room. Pt awake, verbal w/ MODERATE-SEVERE Dysarthria noted. A/O; followed general instructions. Pt endorsed she was "tired".  On [place] O2, afebrile. WBC remains elevated today.   ST services provided skilled assessment/observation and education on positioning strategy during pt's oral intake of trials of thin liquids via cup and puree. She benefited from mod verbal/tactile cues and visual model to place spoon on R side of mouth and use a slight Head Tilt/turn Right in effort to promote Right oral motor bolus control and A-P transfer as well as to decrease Left sulci residue and anterior loss on L. Pt followed through w/ sweep/dry swallow also given cues/model.  Pt was able to follow through w/ the cues and strategies w/ some/min improvement in bolus management and swallowing. However, moderate-profound anterior loss was still pronounced moreso w/ thin liquids from Cup -- pt preferred to drink from the Cup. The use of Head Tilt Right appeared minimally effective. Pt continues to exhibit significantly reduced lingual movements to maintain control then propel boluses posteriorly in a timely manner then also to clear residue of the boluses, which was noted to pool on anterior tongue and Left sulci. Pt followed instructions/model to use lingual sweeping and Dry swallow after each bolus trial which were not overly successful in oral clearing -- suspect due to the lingual weakness. No overt s/s of aspiration were noted w/ the swallowing of thin liquids (suspect BOLUS RESIDUE  since majority of the bolus spilled anteriorly out of mouth).  Pt only accepted 6 po trials total.    Pt presents w/ increased risk for aspiration/aspiration pneumonia in setting of Severe+ oropharyngeal phase Dysphagia; she also presents w/ challenges meeting her nutrition/hydration needs orally d/t the effort of oral intake for her. Recommend continue Dysphagia 1 with thin liquids w/ strict aspiration precautions, monitoring for s/s of aspiration, and swallowing strategies; oral care before/after po's. Tray setup and sitting up w/ positioning and feeding support. Pills CRUSHED in Puree. Briefly discussed the option of alternative means of feeding (PEG) d/t the continued dysphagia -- pt stated "No" again to feeding tubes to this therapist. This was relayed to both MD and Team; Palliative Care is following the pt currently (encouraged further discussion re: GOC in setting of her Dysphagia severity). MD agreed.  ST services will monitor pt's status next 1-2 days. NSG updated.      HPI HPI: Pt is a 89 year old female with a history of CHF, HTN, paroxysmal atrial fibrillation on Eliquis who presented to the ED this morning with Afib with RVR and reports of bleeding.  Patient reports that for the past 2 days she has been dealing with persistent vaginal bleeding and passing clots about the size of a golf ball. She was placed on a cardizem drip but then at 18:45 she became hypotensive and weak on the Left side. Neurology has consulted pt and assessed Left sided weakness w/ improvement of her s/s since initial complaint.  Pt and friend stated the s/s "come and go" -- MD and NSG were informed of this and the presence of Left OM weakenss w/ Dysarthria and Dysphagia at  the time of this BSE/SLE (1400 on 07/09/2023).   Imaging today: MRI HEAD IMPRESSION:     1. Subtle 1.5 cm focus of diffusion signal abnormality involving the  right frontal corona radiata, suspicious for an acute ischemic  infarct. No associated  hemorrhage or mass effect.  2. Underlying minor chronic microvascular ischemic disease with tiny  remote right occipital and left cerebellar infarcts.     MRA HEAD IMPRESSION:     1. Acute occlusion of a proximal right M2 branch, superior division.  2. Additional moderate proximal right M2 stenosis, inferior  division.  3. Severe distal right P2 stenosis, with additional moderate left P2  stenosis.  4. Focal severe right A1 stenosis.      SLP Plan  Continue with current plan of care      Recommendations for follow up therapy are one component of a multi-disciplinary discharge planning process, led by the attending physician.  Recommendations may be updated based on patient status, additional functional criteria and insurance authorization.    Recommendations  Diet recommendations: Dysphagia 1 (puree);Thin liquid (monitor) Liquids provided via: Cup;No straw Medication Administration: Crushed with puree Supervision: Staff to assist with self feeding;Full supervision/cueing for compensatory strategies;Patient able to self feed Compensations: Minimize environmental distractions;Slow rate;Small sips/bites;Lingual sweep for clearance of pocketing;Monitor for anterior loss;Multiple dry swallows after each bite/sip;Follow solids with liquid;Clear throat intermittently (R head tilt/turn to aid oral phase) Postural Changes and/or Swallow Maneuvers: Out of bed for meals;Seated upright 90 degrees;Upright 30-60 min after meal                 (Palliative Care f/u for GOC; Dietician) Oral care BID;Oral care before and after PO;Staff/trained caregiver to provide oral care   Frequent or constant Supervision/Assistance Dysphagia, oropharyngeal phase (R13.12);Dysarthria and anarthria (R47.1)     Continue with current plan of care      Marissa Som, MS, CCC-SLP Speech Language Pathologist Rehab Services; [facility] [phone] (ascom) Marissa Gilmore  07/17/2023, 4:47 PM

## 2023-07-18 ENCOUNTER — Other Ambulatory Visit: Payer: Self-pay

## 2023-07-18 DIAGNOSIS — I639 Cerebral infarction, unspecified: Secondary | ICD-10-CM | POA: Diagnosis not present

## 2023-07-18 DIAGNOSIS — I63521 Cerebral infarction due to unspecified occlusion or stenosis of right anterior cerebral artery: Principal | ICD-10-CM

## 2023-07-18 DIAGNOSIS — N939 Abnormal uterine and vaginal bleeding, unspecified: Secondary | ICD-10-CM | POA: Diagnosis not present

## 2023-07-18 DIAGNOSIS — D62 Acute posthemorrhagic anemia: Secondary | ICD-10-CM | POA: Diagnosis not present

## 2023-07-18 DIAGNOSIS — Z515 Encounter for palliative care: Secondary | ICD-10-CM

## 2023-07-18 DIAGNOSIS — I4891 Unspecified atrial fibrillation: Secondary | ICD-10-CM | POA: Diagnosis not present

## 2023-07-18 LAB — CBC
HCT: 25.9 % — ABNORMAL LOW (ref 36.0–46.0)
Hemoglobin: 8.5 g/dL — ABNORMAL LOW (ref 12.0–15.0)
MCH: 31.5 pg (ref 26.0–34.0)
MCHC: 32.8 g/dL (ref 30.0–36.0)
MCV: 95.9 fL (ref 80.0–100.0)
Platelets: 86 10*3/uL — ABNORMAL LOW (ref 150–400)
RBC: 2.7 MIL/uL — ABNORMAL LOW (ref 3.87–5.11)
RDW: 22.6 % — ABNORMAL HIGH (ref 11.5–15.5)
WBC: 19.4 10*3/uL — ABNORMAL HIGH (ref 4.0–10.5)
nRBC: 0.4 % — ABNORMAL HIGH (ref 0.0–0.2)

## 2023-07-18 LAB — BASIC METABOLIC PANEL
Anion gap: 7 (ref 5–15)
BUN: 32 mg/dL — ABNORMAL HIGH (ref 8–23)
CO2: 21 mmol/L — ABNORMAL LOW (ref 22–32)
Calcium: 9.2 mg/dL (ref 8.9–10.3)
Chloride: 113 mmol/L — ABNORMAL HIGH (ref 98–111)
Creatinine, Ser: 1.36 mg/dL — ABNORMAL HIGH (ref 0.44–1.00)
GFR, Estimated: 37 mL/min — ABNORMAL LOW (ref >60)
Glucose, Bld: 153 mg/dL — ABNORMAL HIGH (ref 70–99)
Potassium: 4.5 mmol/L (ref 3.5–5.1)
Sodium: 141 mmol/L (ref 135–145)

## 2023-07-18 LAB — GLUCOSE, CAPILLARY: Glucose-Capillary: 138 mg/dL — ABNORMAL HIGH (ref 70–99)

## 2023-07-18 MED ORDER — ENSURE ENLIVE PO LIQD: 237.0000 mL | Freq: Three times a day (TID) | ORAL | 12 refills | Status: DC

## 2023-07-18 MED ORDER — ACETAMINOPHEN 325 MG PO TABS: 650.0000 mg | ORAL_TABLET | ORAL | Status: DC | PRN

## 2023-07-18 MED ORDER — CHLORHEXIDINE GLUCONATE CLOTH 2 % EX PADS: 6.0000 | MEDICATED_PAD | Freq: Every day | CUTANEOUS | Status: DC

## 2023-07-18 MED ORDER — ONDANSETRON 4 MG PO TBDP: 4.0000 mg | ORAL_TABLET | Freq: Four times a day (QID) | ORAL | Status: DC | PRN

## 2023-07-18 MED ORDER — ONDANSETRON 4 MG PO TBDP: 4.0000 mg | ORAL_TABLET | Freq: Four times a day (QID) | ORAL | 0 refills | Status: DC | PRN

## 2023-07-18 MED ORDER — HALOPERIDOL LACTATE 2 MG/ML PO CONC: 1.0000 mg | ORAL | Status: DC | PRN

## 2023-07-18 MED ORDER — HALOPERIDOL LACTATE 2 MG/ML PO CONC: 0.5000 mg | ORAL | Status: DC | PRN

## 2023-07-18 MED ORDER — ASPIRIN 81 MG PO CHEW: 81.0000 mg | CHEWABLE_TABLET | Freq: Every day | ORAL | Status: DC

## 2023-07-18 MED ORDER — DILTIAZEM HCL ER COATED BEADS 180 MG PO CP24: 180.0000 mg | ORAL_CAPSULE | Freq: Every day | ORAL | Status: DC

## 2023-07-18 MED ORDER — MEDROXYPROGESTERONE ACETATE 10 MG PO TABS: 20.0000 mg | ORAL_TABLET | Freq: Every day | ORAL | Status: DC

## 2023-07-18 MED ORDER — BIOTENE DRY MOUTH MT LIQD: 15.0000 mL | OROMUCOSAL | Status: DC | PRN

## 2023-07-18 MED ORDER — HALOPERIDOL 0.5 MG PO TABS: 0.5000 mg | ORAL_TABLET | ORAL | Status: DC | PRN

## 2023-07-18 MED ORDER — GLYCOPYRROLATE 1 MG PO TABS: 1.0000 mg | ORAL_TABLET | ORAL | Status: DC | PRN

## 2023-07-18 MED ORDER — GLYCOPYRROLATE 0.2 MG/ML IJ SOLN: 0.2000 mg | INTRAMUSCULAR | Status: DC | PRN

## 2023-07-18 MED ORDER — POLYVINYL ALCOHOL 1.4 % OP SOLN: 1.0000 [drp] | Freq: Four times a day (QID) | OPHTHALMIC | 0 refills | Status: DC | PRN

## 2023-07-18 MED ORDER — MORPHINE SULFATE (PF) 2 MG/ML IV SOLN: 1.0000 mg | INTRAVENOUS | Status: DC | PRN

## 2023-07-18 MED ORDER — ENSURE ENLIVE PO LIQD: 237.0000 mL | Freq: Three times a day (TID) | ORAL | Status: DC

## 2023-07-18 MED ORDER — HALOPERIDOL LACTATE 5 MG/ML IJ SOLN: 0.5000 mg | INTRAMUSCULAR | Status: DC | PRN

## 2023-07-18 MED ORDER — POLYVINYL ALCOHOL 1.4 % OP SOLN: 1.0000 [drp] | Freq: Four times a day (QID) | OPHTHALMIC | Status: DC | PRN

## 2023-07-18 MED ORDER — MORPHINE SULFATE (CONCENTRATE) 10 MG /0.5 ML PO SOLN: 5.0000 mg | ORAL | Status: DC | PRN

## 2023-07-18 MED ORDER — ADULT MULTIVITAMIN W/MINERALS CH: 1.0000 | ORAL_TABLET | Freq: Every day | ORAL | Status: DC

## 2023-07-18 MED ORDER — ONDANSETRON HCL 4 MG/2ML IJ SOLN: 4.0000 mg | Freq: Four times a day (QID) | INTRAMUSCULAR | Status: DC | PRN

## 2023-07-18 NOTE — Plan of Care (Signed)
Pt is comfort care now and going to hospices Home by EMS today. Pt have refused every thing, from medication to food. Stated " I made my decision and I am at peace".

## 2023-07-18 NOTE — Progress Notes (Signed)
[facility]- [employer] engineer, contracting  Received request from Transitions of Care Manger for family interest in [facility].  Eligibility has been confirmed. .  Met with patient and family to confirm interest and explain services.  Family agreeable to transfer today.  Transitions of Care manager aware.  RN please call report to [phone] [facility]) prior to patient leaving the unit.  Please send signed and completed DNR with patient at discharge.  Thank you  Redge Gainer,  [facility] Liaison 336 [phone]

## 2023-07-18 NOTE — Progress Notes (Cosign Needed Addendum)
[facility] CLINIC CARDIOLOGY CONSULT NOTE       Patient ID: Marissa Gilmore MRN: [id number] DOB/AGE: 03/30/1931 89 y.o.  Admit date: 07/08/2023 Referring Physician Dr. Renae Gloss Primary Physician Dr. Beryle Flock Primary Cardiologist Dr. Juliann Pares Reason for Consultation Atrial fibrillation RVR  HPI: Marissa Gilmore is a 89 y.o. female  with a past medical history of paroxysmal atrial fibrillation, chronic HFrEF (EF 55-60% on echo this admission), NICM, peripheral vascular disease, hypertension, hyperlipidemia,  who presented to the ED on 07/08/2023 for vaginal bleeding. 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 During her hospitalization she has been treated for anemia and has received 6 units PRBCs. Has been intermittently in atrial fibrillation, she has a history of paroxysmal afib. Rates have been elevated at times. Cardiology was consulted for further evaluation and management of AF RVR.   Interval history:  -Patient resting comfortably this AM, denies any palpitations, CP, SOB.  -Additional strokes noted on MRI yesterday.  -HR much better controlled on telemetry. Remains in atrial fibrillation.   Review of systems complete and found to be negative unless listed above    Past Medical History:  Diagnosis Date   Atrial fibrillation (HCC)    Cataract    Diastolic heart failure (HCC)    35% EF   Hypertension     Past Surgical History:  Procedure Laterality Date   CATARACT EXTRACTION Right    DILATION AND CURETTAGE OF UTERUS N/A 07/12/2023   Procedure: DILATATION AND CURETTAGE;  Surgeon: Schermerhorn, Ihor Austin, MD;  Location: [facility] ORS;  Service: Gynecology;  Laterality: N/A;   IR EMBO ART  VEN HEMORR LYMPH EXTRAV  INC GUIDE ROADMAPPING  07/16/2023   IR IVC FILTER PLMT / S&I /IMG GUID/MOD SED  07/16/2023    Medications Prior to Admission  Medication Sig Dispense Refill Last Dose   amiodarone (PACERONE) 200 MG tablet Take 200  mg by mouth as directed. Take 1 tablet (200 mg total) by mouth as directed Take 1 tablet twice daily for 2 weeks. Thereafter take 1 tablet daily.   07/08/2023 at 1000   atorvastatin (LIPITOR) 40 MG tablet Take 1 tablet (40 mg total) by mouth daily. 90 tablet 3 07/08/2023   ELIQUIS 2.5 MG TABS tablet SMARTSIG:1 Tablet(s) By Mouth Every 12 Hours   07/08/2023 at 1000   metoprolol tartrate (LOPRESSOR) 25 MG tablet Take 25 mg by mouth 2 (two) times daily.    07/08/2023 at 1000   potassium chloride (KLOR-CON) 20 MEQ packet Take 20 mEq by mouth daily.   07/08/2023 at 1000   sacubitril-valsartan (ENTRESTO) 24-26 MG Take 1 tablet by mouth 2 (two) times daily.    07/08/2023 at 1000   spironolactone (ALDACTONE) 25 MG tablet Take 12.5 mg by mouth daily.   07/08/2023 at 1000   torsemide (DEMADEX) 20 MG tablet Take 20 mg by mouth 2 (two) times daily.   07/08/2023 at 1000   furosemide (LASIX) 40 MG tablet TAKE 1 TABLET BY MOUTH EVERY DAY (Patient not taking: Reported on 07/09/2023) 90 tablet 1 Not Taking   Social History   Socioeconomic History   Marital status: Single    Spouse name: Not on file   Number of children: 0   Years of education: Not on file   Highest education level: 12th grade  Occupational History   Occupation: retired    Comment: Manufacturing engineer  Tobacco Use   Smoking status: Never   Smokeless tobacco:  [facility] CLINIC CARDIOLOGY CONSULT NOTE       Patient ID: Marissa Gilmore MRN: [id number] DOB/AGE: 03/30/1931 89 y.o.  Admit date: 07/08/2023 Referring Physician Dr. Renae Gloss Primary Physician Dr. Beryle Flock Primary Cardiologist Dr. Juliann Pares Reason for Consultation Atrial fibrillation RVR  HPI: Marissa Gilmore is a 89 y.o. female  with a past medical history of paroxysmal atrial fibrillation, chronic HFrEF (EF 55-60% on echo this admission), NICM, peripheral vascular disease, hypertension, hyperlipidemia,  who presented to the ED on 07/08/2023 for vaginal bleeding. 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 During her hospitalization she has been treated for anemia and has received 6 units PRBCs. Has been intermittently in atrial fibrillation, she has a history of paroxysmal afib. Rates have been elevated at times. Cardiology was consulted for further evaluation and management of AF RVR.   Interval history:  -Patient resting comfortably this AM, denies any palpitations, CP, SOB.  -Additional strokes noted on MRI yesterday.  -HR much better controlled on telemetry. Remains in atrial fibrillation.   Review of systems complete and found to be negative unless listed above    Past Medical History:  Diagnosis Date   Atrial fibrillation (HCC)    Cataract    Diastolic heart failure (HCC)    35% EF   Hypertension     Past Surgical History:  Procedure Laterality Date   CATARACT EXTRACTION Right    DILATION AND CURETTAGE OF UTERUS N/A 07/12/2023   Procedure: DILATATION AND CURETTAGE;  Surgeon: Schermerhorn, Ihor Austin, MD;  Location: [facility] ORS;  Service: Gynecology;  Laterality: N/A;   IR EMBO ART  VEN HEMORR LYMPH EXTRAV  INC GUIDE ROADMAPPING  07/16/2023   IR IVC FILTER PLMT / S&I /IMG GUID/MOD SED  07/16/2023    Medications Prior to Admission  Medication Sig Dispense Refill Last Dose   amiodarone (PACERONE) 200 MG tablet Take 200  mg by mouth as directed. Take 1 tablet (200 mg total) by mouth as directed Take 1 tablet twice daily for 2 weeks. Thereafter take 1 tablet daily.   07/08/2023 at 1000   atorvastatin (LIPITOR) 40 MG tablet Take 1 tablet (40 mg total) by mouth daily. 90 tablet 3 07/08/2023   ELIQUIS 2.5 MG TABS tablet SMARTSIG:1 Tablet(s) By Mouth Every 12 Hours   07/08/2023 at 1000   metoprolol tartrate (LOPRESSOR) 25 MG tablet Take 25 mg by mouth 2 (two) times daily.    07/08/2023 at 1000   potassium chloride (KLOR-CON) 20 MEQ packet Take 20 mEq by mouth daily.   07/08/2023 at 1000   sacubitril-valsartan (ENTRESTO) 24-26 MG Take 1 tablet by mouth 2 (two) times daily.    07/08/2023 at 1000   spironolactone (ALDACTONE) 25 MG tablet Take 12.5 mg by mouth daily.   07/08/2023 at 1000   torsemide (DEMADEX) 20 MG tablet Take 20 mg by mouth 2 (two) times daily.   07/08/2023 at 1000   furosemide (LASIX) 40 MG tablet TAKE 1 TABLET BY MOUTH EVERY DAY (Patient not taking: Reported on 07/09/2023) 90 tablet 1 Not Taking   Social History   Socioeconomic History   Marital status: Single    Spouse name: Not on file   Number of children: 0   Years of education: Not on file   Highest education level: 12th grade  Occupational History   Occupation: retired    Comment: Manufacturing engineer  Tobacco Use   Smoking status: Never   Smokeless tobacco:  [facility] CLINIC CARDIOLOGY CONSULT NOTE       Patient ID: Marissa Gilmore MRN: [id number] DOB/AGE: 03/30/1931 89 y.o.  Admit date: 07/08/2023 Referring Physician Dr. Renae Gloss Primary Physician Dr. Beryle Flock Primary Cardiologist Dr. Juliann Pares Reason for Consultation Atrial fibrillation RVR  HPI: Marissa Gilmore is a 89 y.o. female  with a past medical history of paroxysmal atrial fibrillation, chronic HFrEF (EF 55-60% on echo this admission), NICM, peripheral vascular disease, hypertension, hyperlipidemia,  who presented to the ED on 07/08/2023 for vaginal bleeding. 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 During her hospitalization she has been treated for anemia and has received 6 units PRBCs. Has been intermittently in atrial fibrillation, she has a history of paroxysmal afib. Rates have been elevated at times. Cardiology was consulted for further evaluation and management of AF RVR.   Interval history:  -Patient resting comfortably this AM, denies any palpitations, CP, SOB.  -Additional strokes noted on MRI yesterday.  -HR much better controlled on telemetry. Remains in atrial fibrillation.   Review of systems complete and found to be negative unless listed above    Past Medical History:  Diagnosis Date   Atrial fibrillation (HCC)    Cataract    Diastolic heart failure (HCC)    35% EF   Hypertension     Past Surgical History:  Procedure Laterality Date   CATARACT EXTRACTION Right    DILATION AND CURETTAGE OF UTERUS N/A 07/12/2023   Procedure: DILATATION AND CURETTAGE;  Surgeon: Schermerhorn, Ihor Austin, MD;  Location: [facility] ORS;  Service: Gynecology;  Laterality: N/A;   IR EMBO ART  VEN HEMORR LYMPH EXTRAV  INC GUIDE ROADMAPPING  07/16/2023   IR IVC FILTER PLMT / S&I /IMG GUID/MOD SED  07/16/2023    Medications Prior to Admission  Medication Sig Dispense Refill Last Dose   amiodarone (PACERONE) 200 MG tablet Take 200  mg by mouth as directed. Take 1 tablet (200 mg total) by mouth as directed Take 1 tablet twice daily for 2 weeks. Thereafter take 1 tablet daily.   07/08/2023 at 1000   atorvastatin (LIPITOR) 40 MG tablet Take 1 tablet (40 mg total) by mouth daily. 90 tablet 3 07/08/2023   ELIQUIS 2.5 MG TABS tablet SMARTSIG:1 Tablet(s) By Mouth Every 12 Hours   07/08/2023 at 1000   metoprolol tartrate (LOPRESSOR) 25 MG tablet Take 25 mg by mouth 2 (two) times daily.    07/08/2023 at 1000   potassium chloride (KLOR-CON) 20 MEQ packet Take 20 mEq by mouth daily.   07/08/2023 at 1000   sacubitril-valsartan (ENTRESTO) 24-26 MG Take 1 tablet by mouth 2 (two) times daily.    07/08/2023 at 1000   spironolactone (ALDACTONE) 25 MG tablet Take 12.5 mg by mouth daily.   07/08/2023 at 1000   torsemide (DEMADEX) 20 MG tablet Take 20 mg by mouth 2 (two) times daily.   07/08/2023 at 1000   furosemide (LASIX) 40 MG tablet TAKE 1 TABLET BY MOUTH EVERY DAY (Patient not taking: Reported on 07/09/2023) 90 tablet 1 Not Taking   Social History   Socioeconomic History   Marital status: Single    Spouse name: Not on file   Number of children: 0   Years of education: Not on file   Highest education level: 12th grade  Occupational History   Occupation: retired    Comment: Manufacturing engineer  Tobacco Use   Smoking status: Never   Smokeless tobacco:  carotid stenosis is based on velocity parameters that correlate the residual internal carotid diameter with NASCET-based stenosis levels, using the diameter of the distal internal carotid lumen as  the denominator for stenosis measurement. The following velocity measurements were obtained: RIGHT ICA: 103/17 cm/sec CCA: 95/8 cm/sec SYSTOLIC ICA/CCA RATIO:  1.1 ECA: 159 cm/sec LEFT ICA: 147/42 cm/sec CCA: 116/14 cm/sec SYSTOLIC ICA/CCA RATIO:  0.3 ECA: 105 cm/sec RIGHT CAROTID ARTERY: There is a moderate amount of eccentric echogenic plaque within the distal aspect of the right common carotid artery (image 11). There is a moderate amount of eccentric echogenic plaque within the right carotid bulb (images 14 and 15), extending to involve the origin and proximal aspects of the right internal carotid artery (image 22), not resulting in elevated peak systolic velocities within the interrogated course of the right internal carotid artery to suggest a hemodynamically significant stenosis. RIGHT VERTEBRAL ARTERY:  Antegrade flow LEFT CAROTID ARTERY: There is a moderate amount of eccentric echogenic partially shadowing plaque within left carotid bulb (images 45 and 46), extending to involve the origin and proximal aspects of the right internal carotid artery (image 53), which results in elevated peak systolic velocities within the proximal and mid aspects of the left internal carotid artery. Greatest acquired peak systolic velocity within the proximal left ICA measures 147 centimeters/second (image 55). LEFT VERTEBRAL ARTERY:  Antegrade flow IMPRESSION: 1. Moderate amount of left-sided atherosclerotic plaque results in elevated peak systolic velocities of the left internal carotid artery compatible with the 50-69% luminal narrowing range. Further evaluation with CTA could performed as indicated. 2. Moderate amount of right-sided atherosclerotic plaque, not definitively resulting in a hemodynamically significant stenosis. Electronically Signed   By: Simonne Come M.D.   On: 07/12/2023 13:04   ECHOCARDIOGRAM COMPLETE  Result Date: 07/09/2023    ECHOCARDIOGRAM REPORT   Patient Name:   [facility] Date of Exam:  07/09/2023 Medical Rec #:  [id number]          Height:       64.0 in Accession #:    [id number]         Weight:       128.0 lb Date of Birth:  [birth year]          BSA:          1.618 m Patient Age:    89 years           BP:           101/62 mmHg Patient Gender: F                  HR:           119 bpm. Exam Location:  [facility] Procedure: 2D Echo, Cardiac Doppler and Color Doppler Indications:     Stroke  History:         Patient has no prior history of Echocardiogram examinations.                  Stroke, Arrythmias:Atrial Fibrillation, Signs/Symptoms:Dyspnea                  and Fatigue; Risk Factors:Hypertension and Dyslipidemia.  Sonographer:     Mikki Harbor Referring Phys:  [id number] Andris Baumann Diagnosing Phys: [name] MD IMPRESSIONS  1. Left ventricular ejection fraction, by estimation, is 55 to 60%. The left ventricle has normal function. The left ventricle has no regional wall motion abnormalities. Left ventricular diastolic function could not be evaluated.  2.  carotid stenosis is based on velocity parameters that correlate the residual internal carotid diameter with NASCET-based stenosis levels, using the diameter of the distal internal carotid lumen as  the denominator for stenosis measurement. The following velocity measurements were obtained: RIGHT ICA: 103/17 cm/sec CCA: 95/8 cm/sec SYSTOLIC ICA/CCA RATIO:  1.1 ECA: 159 cm/sec LEFT ICA: 147/42 cm/sec CCA: 116/14 cm/sec SYSTOLIC ICA/CCA RATIO:  0.3 ECA: 105 cm/sec RIGHT CAROTID ARTERY: There is a moderate amount of eccentric echogenic plaque within the distal aspect of the right common carotid artery (image 11). There is a moderate amount of eccentric echogenic plaque within the right carotid bulb (images 14 and 15), extending to involve the origin and proximal aspects of the right internal carotid artery (image 22), not resulting in elevated peak systolic velocities within the interrogated course of the right internal carotid artery to suggest a hemodynamically significant stenosis. RIGHT VERTEBRAL ARTERY:  Antegrade flow LEFT CAROTID ARTERY: There is a moderate amount of eccentric echogenic partially shadowing plaque within left carotid bulb (images 45 and 46), extending to involve the origin and proximal aspects of the right internal carotid artery (image 53), which results in elevated peak systolic velocities within the proximal and mid aspects of the left internal carotid artery. Greatest acquired peak systolic velocity within the proximal left ICA measures 147 centimeters/second (image 55). LEFT VERTEBRAL ARTERY:  Antegrade flow IMPRESSION: 1. Moderate amount of left-sided atherosclerotic plaque results in elevated peak systolic velocities of the left internal carotid artery compatible with the 50-69% luminal narrowing range. Further evaluation with CTA could performed as indicated. 2. Moderate amount of right-sided atherosclerotic plaque, not definitively resulting in a hemodynamically significant stenosis. Electronically Signed   By: Simonne Come M.D.   On: 07/12/2023 13:04   ECHOCARDIOGRAM COMPLETE  Result Date: 07/09/2023    ECHOCARDIOGRAM REPORT   Patient Name:   [facility] Date of Exam:  07/09/2023 Medical Rec #:  [id number]          Height:       64.0 in Accession #:    [id number]         Weight:       128.0 lb Date of Birth:  [birth year]          BSA:          1.618 m Patient Age:    89 years           BP:           101/62 mmHg Patient Gender: F                  HR:           119 bpm. Exam Location:  [facility] Procedure: 2D Echo, Cardiac Doppler and Color Doppler Indications:     Stroke  History:         Patient has no prior history of Echocardiogram examinations.                  Stroke, Arrythmias:Atrial Fibrillation, Signs/Symptoms:Dyspnea                  and Fatigue; Risk Factors:Hypertension and Dyslipidemia.  Sonographer:     Mikki Harbor Referring Phys:  [id number] Andris Baumann Diagnosing Phys: [name] MD IMPRESSIONS  1. Left ventricular ejection fraction, by estimation, is 55 to 60%. The left ventricle has normal function. The left ventricle has no regional wall motion abnormalities. Left ventricular diastolic function could not be evaluated.  2.  [facility] CLINIC CARDIOLOGY CONSULT NOTE       Patient ID: Marissa Gilmore MRN: [id number] DOB/AGE: 03/30/1931 89 y.o.  Admit date: 07/08/2023 Referring Physician Dr. Renae Gloss Primary Physician Dr. Beryle Flock Primary Cardiologist Dr. Juliann Pares Reason for Consultation Atrial fibrillation RVR  HPI: Marissa Gilmore is a 89 y.o. female  with a past medical history of paroxysmal atrial fibrillation, chronic HFrEF (EF 55-60% on echo this admission), NICM, peripheral vascular disease, hypertension, hyperlipidemia,  who presented to the ED on 07/08/2023 for vaginal bleeding. 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 During her hospitalization she has been treated for anemia and has received 6 units PRBCs. Has been intermittently in atrial fibrillation, she has a history of paroxysmal afib. Rates have been elevated at times. Cardiology was consulted for further evaluation and management of AF RVR.   Interval history:  -Patient resting comfortably this AM, denies any palpitations, CP, SOB.  -Additional strokes noted on MRI yesterday.  -HR much better controlled on telemetry. Remains in atrial fibrillation.   Review of systems complete and found to be negative unless listed above    Past Medical History:  Diagnosis Date   Atrial fibrillation (HCC)    Cataract    Diastolic heart failure (HCC)    35% EF   Hypertension     Past Surgical History:  Procedure Laterality Date   CATARACT EXTRACTION Right    DILATION AND CURETTAGE OF UTERUS N/A 07/12/2023   Procedure: DILATATION AND CURETTAGE;  Surgeon: Schermerhorn, Ihor Austin, MD;  Location: [facility] ORS;  Service: Gynecology;  Laterality: N/A;   IR EMBO ART  VEN HEMORR LYMPH EXTRAV  INC GUIDE ROADMAPPING  07/16/2023   IR IVC FILTER PLMT / S&I /IMG GUID/MOD SED  07/16/2023    Medications Prior to Admission  Medication Sig Dispense Refill Last Dose   amiodarone (PACERONE) 200 MG tablet Take 200  mg by mouth as directed. Take 1 tablet (200 mg total) by mouth as directed Take 1 tablet twice daily for 2 weeks. Thereafter take 1 tablet daily.   07/08/2023 at 1000   atorvastatin (LIPITOR) 40 MG tablet Take 1 tablet (40 mg total) by mouth daily. 90 tablet 3 07/08/2023   ELIQUIS 2.5 MG TABS tablet SMARTSIG:1 Tablet(s) By Mouth Every 12 Hours   07/08/2023 at 1000   metoprolol tartrate (LOPRESSOR) 25 MG tablet Take 25 mg by mouth 2 (two) times daily.    07/08/2023 at 1000   potassium chloride (KLOR-CON) 20 MEQ packet Take 20 mEq by mouth daily.   07/08/2023 at 1000   sacubitril-valsartan (ENTRESTO) 24-26 MG Take 1 tablet by mouth 2 (two) times daily.    07/08/2023 at 1000   spironolactone (ALDACTONE) 25 MG tablet Take 12.5 mg by mouth daily.   07/08/2023 at 1000   torsemide (DEMADEX) 20 MG tablet Take 20 mg by mouth 2 (two) times daily.   07/08/2023 at 1000   furosemide (LASIX) 40 MG tablet TAKE 1 TABLET BY MOUTH EVERY DAY (Patient not taking: Reported on 07/09/2023) 90 tablet 1 Not Taking   Social History   Socioeconomic History   Marital status: Single    Spouse name: Not on file   Number of children: 0   Years of education: Not on file   Highest education level: 12th grade  Occupational History   Occupation: retired    Comment: Manufacturing engineer  Tobacco Use   Smoking status: Never   Smokeless tobacco:  carotid stenosis is based on velocity parameters that correlate the residual internal carotid diameter with NASCET-based stenosis levels, using the diameter of the distal internal carotid lumen as  the denominator for stenosis measurement. The following velocity measurements were obtained: RIGHT ICA: 103/17 cm/sec CCA: 95/8 cm/sec SYSTOLIC ICA/CCA RATIO:  1.1 ECA: 159 cm/sec LEFT ICA: 147/42 cm/sec CCA: 116/14 cm/sec SYSTOLIC ICA/CCA RATIO:  0.3 ECA: 105 cm/sec RIGHT CAROTID ARTERY: There is a moderate amount of eccentric echogenic plaque within the distal aspect of the right common carotid artery (image 11). There is a moderate amount of eccentric echogenic plaque within the right carotid bulb (images 14 and 15), extending to involve the origin and proximal aspects of the right internal carotid artery (image 22), not resulting in elevated peak systolic velocities within the interrogated course of the right internal carotid artery to suggest a hemodynamically significant stenosis. RIGHT VERTEBRAL ARTERY:  Antegrade flow LEFT CAROTID ARTERY: There is a moderate amount of eccentric echogenic partially shadowing plaque within left carotid bulb (images 45 and 46), extending to involve the origin and proximal aspects of the right internal carotid artery (image 53), which results in elevated peak systolic velocities within the proximal and mid aspects of the left internal carotid artery. Greatest acquired peak systolic velocity within the proximal left ICA measures 147 centimeters/second (image 55). LEFT VERTEBRAL ARTERY:  Antegrade flow IMPRESSION: 1. Moderate amount of left-sided atherosclerotic plaque results in elevated peak systolic velocities of the left internal carotid artery compatible with the 50-69% luminal narrowing range. Further evaluation with CTA could performed as indicated. 2. Moderate amount of right-sided atherosclerotic plaque, not definitively resulting in a hemodynamically significant stenosis. Electronically Signed   By: Simonne Come M.D.   On: 07/12/2023 13:04   ECHOCARDIOGRAM COMPLETE  Result Date: 07/09/2023    ECHOCARDIOGRAM REPORT   Patient Name:   [facility] Date of Exam:  07/09/2023 Medical Rec #:  [id number]          Height:       64.0 in Accession #:    [id number]         Weight:       128.0 lb Date of Birth:  [birth year]          BSA:          1.618 m Patient Age:    89 years           BP:           101/62 mmHg Patient Gender: F                  HR:           119 bpm. Exam Location:  [facility] Procedure: 2D Echo, Cardiac Doppler and Color Doppler Indications:     Stroke  History:         Patient has no prior history of Echocardiogram examinations.                  Stroke, Arrythmias:Atrial Fibrillation, Signs/Symptoms:Dyspnea                  and Fatigue; Risk Factors:Hypertension and Dyslipidemia.  Sonographer:     Mikki Harbor Referring Phys:  [id number] Andris Baumann Diagnosing Phys: [name] MD IMPRESSIONS  1. Left ventricular ejection fraction, by estimation, is 55 to 60%. The left ventricle has normal function. The left ventricle has no regional wall motion abnormalities. Left ventricular diastolic function could not be evaluated.  2.  carotid stenosis is based on velocity parameters that correlate the residual internal carotid diameter with NASCET-based stenosis levels, using the diameter of the distal internal carotid lumen as  the denominator for stenosis measurement. The following velocity measurements were obtained: RIGHT ICA: 103/17 cm/sec CCA: 95/8 cm/sec SYSTOLIC ICA/CCA RATIO:  1.1 ECA: 159 cm/sec LEFT ICA: 147/42 cm/sec CCA: 116/14 cm/sec SYSTOLIC ICA/CCA RATIO:  0.3 ECA: 105 cm/sec RIGHT CAROTID ARTERY: There is a moderate amount of eccentric echogenic plaque within the distal aspect of the right common carotid artery (image 11). There is a moderate amount of eccentric echogenic plaque within the right carotid bulb (images 14 and 15), extending to involve the origin and proximal aspects of the right internal carotid artery (image 22), not resulting in elevated peak systolic velocities within the interrogated course of the right internal carotid artery to suggest a hemodynamically significant stenosis. RIGHT VERTEBRAL ARTERY:  Antegrade flow LEFT CAROTID ARTERY: There is a moderate amount of eccentric echogenic partially shadowing plaque within left carotid bulb (images 45 and 46), extending to involve the origin and proximal aspects of the right internal carotid artery (image 53), which results in elevated peak systolic velocities within the proximal and mid aspects of the left internal carotid artery. Greatest acquired peak systolic velocity within the proximal left ICA measures 147 centimeters/second (image 55). LEFT VERTEBRAL ARTERY:  Antegrade flow IMPRESSION: 1. Moderate amount of left-sided atherosclerotic plaque results in elevated peak systolic velocities of the left internal carotid artery compatible with the 50-69% luminal narrowing range. Further evaluation with CTA could performed as indicated. 2. Moderate amount of right-sided atherosclerotic plaque, not definitively resulting in a hemodynamically significant stenosis. Electronically Signed   By: Simonne Come M.D.   On: 07/12/2023 13:04   ECHOCARDIOGRAM COMPLETE  Result Date: 07/09/2023    ECHOCARDIOGRAM REPORT   Patient Name:   [facility] Date of Exam:  07/09/2023 Medical Rec #:  [id number]          Height:       64.0 in Accession #:    [id number]         Weight:       128.0 lb Date of Birth:  [birth year]          BSA:          1.618 m Patient Age:    89 years           BP:           101/62 mmHg Patient Gender: F                  HR:           119 bpm. Exam Location:  [facility] Procedure: 2D Echo, Cardiac Doppler and Color Doppler Indications:     Stroke  History:         Patient has no prior history of Echocardiogram examinations.                  Stroke, Arrythmias:Atrial Fibrillation, Signs/Symptoms:Dyspnea                  and Fatigue; Risk Factors:Hypertension and Dyslipidemia.  Sonographer:     Mikki Harbor Referring Phys:  [id number] Andris Baumann Diagnosing Phys: [name] MD IMPRESSIONS  1. Left ventricular ejection fraction, by estimation, is 55 to 60%. The left ventricle has normal function. The left ventricle has no regional wall motion abnormalities. Left ventricular diastolic function could not be evaluated.  2.  [facility] CLINIC CARDIOLOGY CONSULT NOTE       Patient ID: Marissa Gilmore MRN: [id number] DOB/AGE: 03/30/1931 89 y.o.  Admit date: 07/08/2023 Referring Physician Dr. Renae Gloss Primary Physician Dr. Beryle Flock Primary Cardiologist Dr. Juliann Pares Reason for Consultation Atrial fibrillation RVR  HPI: Marissa Gilmore is a 89 y.o. female  with a past medical history of paroxysmal atrial fibrillation, chronic HFrEF (EF 55-60% on echo this admission), NICM, peripheral vascular disease, hypertension, hyperlipidemia,  who presented to the ED on 07/08/2023 for vaginal bleeding. 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 During her hospitalization she has been treated for anemia and has received 6 units PRBCs. Has been intermittently in atrial fibrillation, she has a history of paroxysmal afib. Rates have been elevated at times. Cardiology was consulted for further evaluation and management of AF RVR.   Interval history:  -Patient resting comfortably this AM, denies any palpitations, CP, SOB.  -Additional strokes noted on MRI yesterday.  -HR much better controlled on telemetry. Remains in atrial fibrillation.   Review of systems complete and found to be negative unless listed above    Past Medical History:  Diagnosis Date   Atrial fibrillation (HCC)    Cataract    Diastolic heart failure (HCC)    35% EF   Hypertension     Past Surgical History:  Procedure Laterality Date   CATARACT EXTRACTION Right    DILATION AND CURETTAGE OF UTERUS N/A 07/12/2023   Procedure: DILATATION AND CURETTAGE;  Surgeon: Schermerhorn, Ihor Austin, MD;  Location: [facility] ORS;  Service: Gynecology;  Laterality: N/A;   IR EMBO ART  VEN HEMORR LYMPH EXTRAV  INC GUIDE ROADMAPPING  07/16/2023   IR IVC FILTER PLMT / S&I /IMG GUID/MOD SED  07/16/2023    Medications Prior to Admission  Medication Sig Dispense Refill Last Dose   amiodarone (PACERONE) 200 MG tablet Take 200  mg by mouth as directed. Take 1 tablet (200 mg total) by mouth as directed Take 1 tablet twice daily for 2 weeks. Thereafter take 1 tablet daily.   07/08/2023 at 1000   atorvastatin (LIPITOR) 40 MG tablet Take 1 tablet (40 mg total) by mouth daily. 90 tablet 3 07/08/2023   ELIQUIS 2.5 MG TABS tablet SMARTSIG:1 Tablet(s) By Mouth Every 12 Hours   07/08/2023 at 1000   metoprolol tartrate (LOPRESSOR) 25 MG tablet Take 25 mg by mouth 2 (two) times daily.    07/08/2023 at 1000   potassium chloride (KLOR-CON) 20 MEQ packet Take 20 mEq by mouth daily.   07/08/2023 at 1000   sacubitril-valsartan (ENTRESTO) 24-26 MG Take 1 tablet by mouth 2 (two) times daily.    07/08/2023 at 1000   spironolactone (ALDACTONE) 25 MG tablet Take 12.5 mg by mouth daily.   07/08/2023 at 1000   torsemide (DEMADEX) 20 MG tablet Take 20 mg by mouth 2 (two) times daily.   07/08/2023 at 1000   furosemide (LASIX) 40 MG tablet TAKE 1 TABLET BY MOUTH EVERY DAY (Patient not taking: Reported on 07/09/2023) 90 tablet 1 Not Taking   Social History   Socioeconomic History   Marital status: Single    Spouse name: Not on file   Number of children: 0   Years of education: Not on file   Highest education level: 12th grade  Occupational History   Occupation: retired    Comment: Manufacturing engineer  Tobacco Use   Smoking status: Never   Smokeless tobacco:  carotid stenosis is based on velocity parameters that correlate the residual internal carotid diameter with NASCET-based stenosis levels, using the diameter of the distal internal carotid lumen as  the denominator for stenosis measurement. The following velocity measurements were obtained: RIGHT ICA: 103/17 cm/sec CCA: 95/8 cm/sec SYSTOLIC ICA/CCA RATIO:  1.1 ECA: 159 cm/sec LEFT ICA: 147/42 cm/sec CCA: 116/14 cm/sec SYSTOLIC ICA/CCA RATIO:  0.3 ECA: 105 cm/sec RIGHT CAROTID ARTERY: There is a moderate amount of eccentric echogenic plaque within the distal aspect of the right common carotid artery (image 11). There is a moderate amount of eccentric echogenic plaque within the right carotid bulb (images 14 and 15), extending to involve the origin and proximal aspects of the right internal carotid artery (image 22), not resulting in elevated peak systolic velocities within the interrogated course of the right internal carotid artery to suggest a hemodynamically significant stenosis. RIGHT VERTEBRAL ARTERY:  Antegrade flow LEFT CAROTID ARTERY: There is a moderate amount of eccentric echogenic partially shadowing plaque within left carotid bulb (images 45 and 46), extending to involve the origin and proximal aspects of the right internal carotid artery (image 53), which results in elevated peak systolic velocities within the proximal and mid aspects of the left internal carotid artery. Greatest acquired peak systolic velocity within the proximal left ICA measures 147 centimeters/second (image 55). LEFT VERTEBRAL ARTERY:  Antegrade flow IMPRESSION: 1. Moderate amount of left-sided atherosclerotic plaque results in elevated peak systolic velocities of the left internal carotid artery compatible with the 50-69% luminal narrowing range. Further evaluation with CTA could performed as indicated. 2. Moderate amount of right-sided atherosclerotic plaque, not definitively resulting in a hemodynamically significant stenosis. Electronically Signed   By: Simonne Come M.D.   On: 07/12/2023 13:04   ECHOCARDIOGRAM COMPLETE  Result Date: 07/09/2023    ECHOCARDIOGRAM REPORT   Patient Name:   [facility] Date of Exam:  07/09/2023 Medical Rec #:  [id number]          Height:       64.0 in Accession #:    [id number]         Weight:       128.0 lb Date of Birth:  [birth year]          BSA:          1.618 m Patient Age:    89 years           BP:           101/62 mmHg Patient Gender: F                  HR:           119 bpm. Exam Location:  [facility] Procedure: 2D Echo, Cardiac Doppler and Color Doppler Indications:     Stroke  History:         Patient has no prior history of Echocardiogram examinations.                  Stroke, Arrythmias:Atrial Fibrillation, Signs/Symptoms:Dyspnea                  and Fatigue; Risk Factors:Hypertension and Dyslipidemia.  Sonographer:     Mikki Harbor Referring Phys:  [id number] Andris Baumann Diagnosing Phys: [name] MD IMPRESSIONS  1. Left ventricular ejection fraction, by estimation, is 55 to 60%. The left ventricle has normal function. The left ventricle has no regional wall motion abnormalities. Left ventricular diastolic function could not be evaluated.  2.  [facility] CLINIC CARDIOLOGY CONSULT NOTE       Patient ID: Marissa Gilmore MRN: [id number] DOB/AGE: 03/30/1931 89 y.o.  Admit date: 07/08/2023 Referring Physician Dr. Renae Gloss Primary Physician Dr. Beryle Flock Primary Cardiologist Dr. Juliann Pares Reason for Consultation Atrial fibrillation RVR  HPI: Marissa Gilmore is a 89 y.o. female  with a past medical history of paroxysmal atrial fibrillation, chronic HFrEF (EF 55-60% on echo this admission), NICM, peripheral vascular disease, hypertension, hyperlipidemia,  who presented to the ED on 07/08/2023 for vaginal bleeding. 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 During her hospitalization she has been treated for anemia and has received 6 units PRBCs. Has been intermittently in atrial fibrillation, she has a history of paroxysmal afib. Rates have been elevated at times. Cardiology was consulted for further evaluation and management of AF RVR.   Interval history:  -Patient resting comfortably this AM, denies any palpitations, CP, SOB.  -Additional strokes noted on MRI yesterday.  -HR much better controlled on telemetry. Remains in atrial fibrillation.   Review of systems complete and found to be negative unless listed above    Past Medical History:  Diagnosis Date   Atrial fibrillation (HCC)    Cataract    Diastolic heart failure (HCC)    35% EF   Hypertension     Past Surgical History:  Procedure Laterality Date   CATARACT EXTRACTION Right    DILATION AND CURETTAGE OF UTERUS N/A 07/12/2023   Procedure: DILATATION AND CURETTAGE;  Surgeon: Schermerhorn, Ihor Austin, MD;  Location: [facility] ORS;  Service: Gynecology;  Laterality: N/A;   IR EMBO ART  VEN HEMORR LYMPH EXTRAV  INC GUIDE ROADMAPPING  07/16/2023   IR IVC FILTER PLMT / S&I /IMG GUID/MOD SED  07/16/2023    Medications Prior to Admission  Medication Sig Dispense Refill Last Dose   amiodarone (PACERONE) 200 MG tablet Take 200  mg by mouth as directed. Take 1 tablet (200 mg total) by mouth as directed Take 1 tablet twice daily for 2 weeks. Thereafter take 1 tablet daily.   07/08/2023 at 1000   atorvastatin (LIPITOR) 40 MG tablet Take 1 tablet (40 mg total) by mouth daily. 90 tablet 3 07/08/2023   ELIQUIS 2.5 MG TABS tablet SMARTSIG:1 Tablet(s) By Mouth Every 12 Hours   07/08/2023 at 1000   metoprolol tartrate (LOPRESSOR) 25 MG tablet Take 25 mg by mouth 2 (two) times daily.    07/08/2023 at 1000   potassium chloride (KLOR-CON) 20 MEQ packet Take 20 mEq by mouth daily.   07/08/2023 at 1000   sacubitril-valsartan (ENTRESTO) 24-26 MG Take 1 tablet by mouth 2 (two) times daily.    07/08/2023 at 1000   spironolactone (ALDACTONE) 25 MG tablet Take 12.5 mg by mouth daily.   07/08/2023 at 1000   torsemide (DEMADEX) 20 MG tablet Take 20 mg by mouth 2 (two) times daily.   07/08/2023 at 1000   furosemide (LASIX) 40 MG tablet TAKE 1 TABLET BY MOUTH EVERY DAY (Patient not taking: Reported on 07/09/2023) 90 tablet 1 Not Taking   Social History   Socioeconomic History   Marital status: Single    Spouse name: Not on file   Number of children: 0   Years of education: Not on file   Highest education level: 12th grade  Occupational History   Occupation: retired    Comment: Manufacturing engineer  Tobacco Use   Smoking status: Never   Smokeless tobacco:  carotid stenosis is based on velocity parameters that correlate the residual internal carotid diameter with NASCET-based stenosis levels, using the diameter of the distal internal carotid lumen as  the denominator for stenosis measurement. The following velocity measurements were obtained: RIGHT ICA: 103/17 cm/sec CCA: 95/8 cm/sec SYSTOLIC ICA/CCA RATIO:  1.1 ECA: 159 cm/sec LEFT ICA: 147/42 cm/sec CCA: 116/14 cm/sec SYSTOLIC ICA/CCA RATIO:  0.3 ECA: 105 cm/sec RIGHT CAROTID ARTERY: There is a moderate amount of eccentric echogenic plaque within the distal aspect of the right common carotid artery (image 11). There is a moderate amount of eccentric echogenic plaque within the right carotid bulb (images 14 and 15), extending to involve the origin and proximal aspects of the right internal carotid artery (image 22), not resulting in elevated peak systolic velocities within the interrogated course of the right internal carotid artery to suggest a hemodynamically significant stenosis. RIGHT VERTEBRAL ARTERY:  Antegrade flow LEFT CAROTID ARTERY: There is a moderate amount of eccentric echogenic partially shadowing plaque within left carotid bulb (images 45 and 46), extending to involve the origin and proximal aspects of the right internal carotid artery (image 53), which results in elevated peak systolic velocities within the proximal and mid aspects of the left internal carotid artery. Greatest acquired peak systolic velocity within the proximal left ICA measures 147 centimeters/second (image 55). LEFT VERTEBRAL ARTERY:  Antegrade flow IMPRESSION: 1. Moderate amount of left-sided atherosclerotic plaque results in elevated peak systolic velocities of the left internal carotid artery compatible with the 50-69% luminal narrowing range. Further evaluation with CTA could performed as indicated. 2. Moderate amount of right-sided atherosclerotic plaque, not definitively resulting in a hemodynamically significant stenosis. Electronically Signed   By: Simonne Come M.D.   On: 07/12/2023 13:04   ECHOCARDIOGRAM COMPLETE  Result Date: 07/09/2023    ECHOCARDIOGRAM REPORT   Patient Name:   [facility] Date of Exam:  07/09/2023 Medical Rec #:  [id number]          Height:       64.0 in Accession #:    [id number]         Weight:       128.0 lb Date of Birth:  [birth year]          BSA:          1.618 m Patient Age:    89 years           BP:           101/62 mmHg Patient Gender: F                  HR:           119 bpm. Exam Location:  [facility] Procedure: 2D Echo, Cardiac Doppler and Color Doppler Indications:     Stroke  History:         Patient has no prior history of Echocardiogram examinations.                  Stroke, Arrythmias:Atrial Fibrillation, Signs/Symptoms:Dyspnea                  and Fatigue; Risk Factors:Hypertension and Dyslipidemia.  Sonographer:     Mikki Harbor Referring Phys:  [id number] Andris Baumann Diagnosing Phys: [name] MD IMPRESSIONS  1. Left ventricular ejection fraction, by estimation, is 55 to 60%. The left ventricle has normal function. The left ventricle has no regional wall motion abnormalities. Left ventricular diastolic function could not be evaluated.  2.  [facility] CLINIC CARDIOLOGY CONSULT NOTE       Patient ID: Marissa Gilmore MRN: [id number] DOB/AGE: 03/30/1931 89 y.o.  Admit date: 07/08/2023 Referring Physician Dr. Renae Gloss Primary Physician Dr. Beryle Flock Primary Cardiologist Dr. Juliann Pares Reason for Consultation Atrial fibrillation RVR  HPI: Marissa Gilmore is a 89 y.o. female  with a past medical history of paroxysmal atrial fibrillation, chronic HFrEF (EF 55-60% on echo this admission), NICM, peripheral vascular disease, hypertension, hyperlipidemia,  who presented to the ED on 07/08/2023 for vaginal bleeding. 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 During her hospitalization she has been treated for anemia and has received 6 units PRBCs. Has been intermittently in atrial fibrillation, she has a history of paroxysmal afib. Rates have been elevated at times. Cardiology was consulted for further evaluation and management of AF RVR.   Interval history:  -Patient resting comfortably this AM, denies any palpitations, CP, SOB.  -Additional strokes noted on MRI yesterday.  -HR much better controlled on telemetry. Remains in atrial fibrillation.   Review of systems complete and found to be negative unless listed above    Past Medical History:  Diagnosis Date   Atrial fibrillation (HCC)    Cataract    Diastolic heart failure (HCC)    35% EF   Hypertension     Past Surgical History:  Procedure Laterality Date   CATARACT EXTRACTION Right    DILATION AND CURETTAGE OF UTERUS N/A 07/12/2023   Procedure: DILATATION AND CURETTAGE;  Surgeon: Schermerhorn, Ihor Austin, MD;  Location: [facility] ORS;  Service: Gynecology;  Laterality: N/A;   IR EMBO ART  VEN HEMORR LYMPH EXTRAV  INC GUIDE ROADMAPPING  07/16/2023   IR IVC FILTER PLMT / S&I /IMG GUID/MOD SED  07/16/2023    Medications Prior to Admission  Medication Sig Dispense Refill Last Dose   amiodarone (PACERONE) 200 MG tablet Take 200  mg by mouth as directed. Take 1 tablet (200 mg total) by mouth as directed Take 1 tablet twice daily for 2 weeks. Thereafter take 1 tablet daily.   07/08/2023 at 1000   atorvastatin (LIPITOR) 40 MG tablet Take 1 tablet (40 mg total) by mouth daily. 90 tablet 3 07/08/2023   ELIQUIS 2.5 MG TABS tablet SMARTSIG:1 Tablet(s) By Mouth Every 12 Hours   07/08/2023 at 1000   metoprolol tartrate (LOPRESSOR) 25 MG tablet Take 25 mg by mouth 2 (two) times daily.    07/08/2023 at 1000   potassium chloride (KLOR-CON) 20 MEQ packet Take 20 mEq by mouth daily.   07/08/2023 at 1000   sacubitril-valsartan (ENTRESTO) 24-26 MG Take 1 tablet by mouth 2 (two) times daily.    07/08/2023 at 1000   spironolactone (ALDACTONE) 25 MG tablet Take 12.5 mg by mouth daily.   07/08/2023 at 1000   torsemide (DEMADEX) 20 MG tablet Take 20 mg by mouth 2 (two) times daily.   07/08/2023 at 1000   furosemide (LASIX) 40 MG tablet TAKE 1 TABLET BY MOUTH EVERY DAY (Patient not taking: Reported on 07/09/2023) 90 tablet 1 Not Taking   Social History   Socioeconomic History   Marital status: Single    Spouse name: Not on file   Number of children: 0   Years of education: Not on file   Highest education level: 12th grade  Occupational History   Occupation: retired    Comment: Manufacturing engineer  Tobacco Use   Smoking status: Never   Smokeless tobacco:  carotid stenosis is based on velocity parameters that correlate the residual internal carotid diameter with NASCET-based stenosis levels, using the diameter of the distal internal carotid lumen as  the denominator for stenosis measurement. The following velocity measurements were obtained: RIGHT ICA: 103/17 cm/sec CCA: 95/8 cm/sec SYSTOLIC ICA/CCA RATIO:  1.1 ECA: 159 cm/sec LEFT ICA: 147/42 cm/sec CCA: 116/14 cm/sec SYSTOLIC ICA/CCA RATIO:  0.3 ECA: 105 cm/sec RIGHT CAROTID ARTERY: There is a moderate amount of eccentric echogenic plaque within the distal aspect of the right common carotid artery (image 11). There is a moderate amount of eccentric echogenic plaque within the right carotid bulb (images 14 and 15), extending to involve the origin and proximal aspects of the right internal carotid artery (image 22), not resulting in elevated peak systolic velocities within the interrogated course of the right internal carotid artery to suggest a hemodynamically significant stenosis. RIGHT VERTEBRAL ARTERY:  Antegrade flow LEFT CAROTID ARTERY: There is a moderate amount of eccentric echogenic partially shadowing plaque within left carotid bulb (images 45 and 46), extending to involve the origin and proximal aspects of the right internal carotid artery (image 53), which results in elevated peak systolic velocities within the proximal and mid aspects of the left internal carotid artery. Greatest acquired peak systolic velocity within the proximal left ICA measures 147 centimeters/second (image 55). LEFT VERTEBRAL ARTERY:  Antegrade flow IMPRESSION: 1. Moderate amount of left-sided atherosclerotic plaque results in elevated peak systolic velocities of the left internal carotid artery compatible with the 50-69% luminal narrowing range. Further evaluation with CTA could performed as indicated. 2. Moderate amount of right-sided atherosclerotic plaque, not definitively resulting in a hemodynamically significant stenosis. Electronically Signed   By: Simonne Come M.D.   On: 07/12/2023 13:04   ECHOCARDIOGRAM COMPLETE  Result Date: 07/09/2023    ECHOCARDIOGRAM REPORT   Patient Name:   [facility] Date of Exam:  07/09/2023 Medical Rec #:  [id number]          Height:       64.0 in Accession #:    [id number]         Weight:       128.0 lb Date of Birth:  [birth year]          BSA:          1.618 m Patient Age:    89 years           BP:           101/62 mmHg Patient Gender: F                  HR:           119 bpm. Exam Location:  [facility] Procedure: 2D Echo, Cardiac Doppler and Color Doppler Indications:     Stroke  History:         Patient has no prior history of Echocardiogram examinations.                  Stroke, Arrythmias:Atrial Fibrillation, Signs/Symptoms:Dyspnea                  and Fatigue; Risk Factors:Hypertension and Dyslipidemia.  Sonographer:     Mikki Harbor Referring Phys:  [id number] Andris Baumann Diagnosing Phys: [name] MD IMPRESSIONS  1. Left ventricular ejection fraction, by estimation, is 55 to 60%. The left ventricle has normal function. The left ventricle has no regional wall motion abnormalities. Left ventricular diastolic function could not be evaluated.  2.  carotid stenosis is based on velocity parameters that correlate the residual internal carotid diameter with NASCET-based stenosis levels, using the diameter of the distal internal carotid lumen as  the denominator for stenosis measurement. The following velocity measurements were obtained: RIGHT ICA: 103/17 cm/sec CCA: 95/8 cm/sec SYSTOLIC ICA/CCA RATIO:  1.1 ECA: 159 cm/sec LEFT ICA: 147/42 cm/sec CCA: 116/14 cm/sec SYSTOLIC ICA/CCA RATIO:  0.3 ECA: 105 cm/sec RIGHT CAROTID ARTERY: There is a moderate amount of eccentric echogenic plaque within the distal aspect of the right common carotid artery (image 11). There is a moderate amount of eccentric echogenic plaque within the right carotid bulb (images 14 and 15), extending to involve the origin and proximal aspects of the right internal carotid artery (image 22), not resulting in elevated peak systolic velocities within the interrogated course of the right internal carotid artery to suggest a hemodynamically significant stenosis. RIGHT VERTEBRAL ARTERY:  Antegrade flow LEFT CAROTID ARTERY: There is a moderate amount of eccentric echogenic partially shadowing plaque within left carotid bulb (images 45 and 46), extending to involve the origin and proximal aspects of the right internal carotid artery (image 53), which results in elevated peak systolic velocities within the proximal and mid aspects of the left internal carotid artery. Greatest acquired peak systolic velocity within the proximal left ICA measures 147 centimeters/second (image 55). LEFT VERTEBRAL ARTERY:  Antegrade flow IMPRESSION: 1. Moderate amount of left-sided atherosclerotic plaque results in elevated peak systolic velocities of the left internal carotid artery compatible with the 50-69% luminal narrowing range. Further evaluation with CTA could performed as indicated. 2. Moderate amount of right-sided atherosclerotic plaque, not definitively resulting in a hemodynamically significant stenosis. Electronically Signed   By: Simonne Come M.D.   On: 07/12/2023 13:04   ECHOCARDIOGRAM COMPLETE  Result Date: 07/09/2023    ECHOCARDIOGRAM REPORT   Patient Name:   [facility] Date of Exam:  07/09/2023 Medical Rec #:  [id number]          Height:       64.0 in Accession #:    [id number]         Weight:       128.0 lb Date of Birth:  [birth year]          BSA:          1.618 m Patient Age:    89 years           BP:           101/62 mmHg Patient Gender: F                  HR:           119 bpm. Exam Location:  [facility] Procedure: 2D Echo, Cardiac Doppler and Color Doppler Indications:     Stroke  History:         Patient has no prior history of Echocardiogram examinations.                  Stroke, Arrythmias:Atrial Fibrillation, Signs/Symptoms:Dyspnea                  and Fatigue; Risk Factors:Hypertension and Dyslipidemia.  Sonographer:     Mikki Harbor Referring Phys:  [id number] Andris Baumann Diagnosing Phys: [name] MD IMPRESSIONS  1. Left ventricular ejection fraction, by estimation, is 55 to 60%. The left ventricle has normal function. The left ventricle has no regional wall motion abnormalities. Left ventricular diastolic function could not be evaluated.  2.

## 2023-07-18 NOTE — Progress Notes (Signed)
PT Cancellation Note  Patient Details Name: Mandy Guzinski MRN: [id number] DOB: 1931-01-05   Cancelled Treatment:    Reason Eval/Treat Not Completed: Other (comment). Per chart review and MD, pt transitioned to comfort care, PT to sign off.    Olga Coaster PT, DPT 11:16 AM,07/18/23

## 2023-07-18 NOTE — Progress Notes (Signed)
Pt refused all med sand meals this morning and afternoon. SHE ONLY TAKES sips of water and ice for dry mouth. MD made aware and Hospice NP spoke to pt and family and patient is a comfort care  now.

## 2023-07-18 NOTE — TOC Transition Note (Signed)
Transition of Care [facility]) - CM/SW Discharge Note   Patient Details  Name: Marissa Gilmore MRN: [id number] Date of Birth: January 16, 1931  Transition of Care [facility]) CM/SW Contact:  Hetty Ely, RN Phone Number: 07/18/2023, 2:14 PM   Clinical Narrative: Discharge to Hospice Home.      Final next level of care: Other (comment) (Hospice Home) Barriers to Discharge: Barriers Resolved   Patient Goals and CMS Choice CMS Medicare.gov Compare Post Acute Care list provided to:: Patient    Discharge Placement                      Patient and family notified of of transfer: 07/18/23  Discharge Plan and Services Additional resources added to the After Visit Summary for                  DME Arranged: N/A DME Agency: NA         HH Agency: NA        Social Determinants of Health (SDOH) Interventions SDOH Screenings   Food Insecurity: No Food Insecurity (07/09/2023)  Housing: Low Risk  (07/09/2023)  Transportation Needs: No Transportation Needs (07/09/2023)  Utilities: Not At Risk (07/09/2023)  Alcohol Screen: Low Risk  (09/16/2022)  Depression (PHQ2-9): Low Risk  (09/16/2022)  Financial Resource Strain: Low Risk  (09/16/2022)  Physical Activity: Insufficiently Active (09/16/2022)  Social Connections: Moderately Isolated (09/16/2022)  Stress: No Stress Concern Present (09/16/2022)  Tobacco Use: Low Risk  (07/09/2023)     Readmission Risk Interventions     No data to display

## 2023-07-18 NOTE — Progress Notes (Signed)
Palliative Care Progress Note, Assessment & Plan   Patient Name: Marissa Gilmore       Date: 07/18/2023 DOB: 02-Sep-1931  Age: 89 y.o. MRN#: [id number] Attending Physician: Alford Highland, MD Primary Care Physician: Erasmo Downer, MD Admit Date: 07/08/2023  Subjective: Patient is sitting up in bed.  She is pale and less energetic than she has been and all other occasions I met with her.  Her arms are mildly swollen but not tender to touch.  She acknowledges my presence and is able to make her wishes known.  She is alert and oriented x 4.  She shares that she is "done".  Her pastor, niece Okey Regal, great niece Lucille Passy, and [facility] liaison Ree Kida are at bedside during my visit.  HPI: Marissa Gilmore is a 89 y.o. female with medical history significant for Chronic atrial fibrillation on Eliquis and amiodarone, systolic CHF, HTN and HLD who presented to the ED initially with a 2-day history of persistent vaginal bleeding including passage of golf ball size clots.  While in the emergency room, patient developed atrial fibrillation with RVR, then subsequently she developed left-sided facial droop and slurred speech. MRI brain showed subtle 1.5 cm focus of diffusion signal abnormality involving the right frontal corona radiata, suspicious for an acute ischemic infarct. Patient is seen by neurology, due to large amount of vaginal bleeding, anticoagulation was not able to be started. Patient continued to have vaginal bleeding while in the hospital, not able to start anticoagulation, and D&C was performed on 9/7 for vaginal bleeding.   Gyn consulted for continued vaginal bleeding and discussed uterine emoblization, to which patient is agreeable. CT abd/levis revealed DVT and acute small PE  for which heparin gtt was initiated. Multiple units of RBCs given during hospitalization.    9/11 - Successful bilateral ovarian artery embolization and IVC filter placement.  9/12 - code stroke in AM  - evolution of stroke. 9/13 - Pt has decided to forego aggressive meaures and to shift to comfort focused care.  Summary of counseling/coordination of care: Extensive chart review completed prior to meeting patient including labs, vital signs, imaging, progress notes, orders, and available advanced directive documents from current and previous encounters.   After reviewing the patient's chart and assessing the patient at bedside, I spoke with patient in regards to plan and symptom management.  Symptoms assessed.  Patient endorses she is feeling weak, tired, and ready to be out of the hospital.  She denies pain, headache, chest pain, or other acute issues at this time.  Plan of care reviewed. Patient endorsed that she is ready to forego aggressive medical treatments. We discussed comfort measures. [name] would no longer receive aggressive medical interventions such as continuous vital signs, lab work, radiology testing, or medications not focused on comfort. All care would focus on how she is looking and feeling. This would include management of any symptoms that may cause discomfort, pain, shortness of breath, cough, nausea, agitation, anxiety, and/or secretions etc.   Symptoms would be managed with medications and other non-pharmacological interventions such as spiritual support if requested, repositioning, music therapy, or therapeutic listening.   Patient and family verbalized understanding and appreciation.  Pt endorses she wants to continue with full comfort measures and would like to be evaluated for hospice inpatient placement.   Ree Kida discussed hospice philosophy and hospice options.  Pt lives home alone. She has a niece that has traveled to visit from [place] and a great niece that has  traveled from [place]. Patient has no local family to care for her. She has to friends from her church that check on her but she does not have 24 hour care. Therefore, discharge to home is not feasible. TOC following closely for disposition planning.   MAR adjusted to reflect full comfort measures.   PMT will continue to follow and support patient throughout her hospitalization.   Physical Exam Constitutional:      General: She is not in acute distress. HENT:     Head: Normocephalic.  Cardiovascular:     Rate and Rhythm: Tachycardia present. Rhythm irregular.  Pulmonary:     Effort: Pulmonary effort is normal.  Abdominal:     Palpations: Abdomen is soft.  Skin:    General: Skin is warm and dry.     Coloration: Skin is pale.  Neurological:     Mental Status: She is alert and oriented to person, place, and time.  Psychiatric:        Mood and Affect: Mood normal.        Behavior: Behavior normal.        Thought Content: Thought content normal.        Judgment: Judgment normal.             Total Time 50 minutes   Angila Wombles L. Bonita Quin, DNP, FNP-BC Palliative Medicine Team

## 2023-07-18 NOTE — Plan of Care (Signed)
  Problem: Education: Goal: Knowledge of disease or condition will improve Outcome: Progressing Goal: Knowledge of secondary prevention will improve (MUST DOCUMENT ALL) Outcome: Progressing Goal: Knowledge of patient specific risk factors will improve Loraine Leriche N/A or DELETE if not current risk factor) Outcome: Progressing

## 2023-07-18 NOTE — Discharge Summary (Addendum)
Physician Discharge Summary   Patient: Marissa Gilmore MRN: [id number] DOB: Oct 16, 1931  Admit date:     07/08/2023  Discharge date: 07/18/23  Discharge Physician: Marissa Gilmore   PCP: Marissa Downer, MD   Recommendations at discharge:   Discharged to hospice home.  Discharge Diagnoses: Principal Problem:   Hospice care patient Active Problems:   Acute CVA (cerebrovascular accident) (HCC)   Atrial fibrillation with RVR (HCC)   ABLA (acute blood loss anemia)   Hypotension   Chronic systolic (congestive) heart failure (HCC)   Chronic anticoagulation   Post-menopausal bleeding   Acute arterial ischemic stroke, multifocal, anterior circulation, right (HCC)   B12 deficiency anemia   Malnutrition of moderate degree   Hypernatremia   Acute kidney injury superimposed on CKD (HCC)   Thrombocytopenia (HCC)   Vaginal bleeding    Hospital Course: Marissa Gilmore is a 89 y.o. female with medical history significant for Chronic atrial fibrillation on Eliquis and amiodarone, systolic CHF, HTN and HLD who presented to the ED initially with a 2-day history of persistent vaginal bleeding including passage of golf ball size clots.  While in the emergency room, patient developed atrial fibrillation with RVR, then subsequently she developed left-sided facial droop and slurred speech. MRI brain showed subtle 1.5 cm focus of diffusion signal abnormality involving the right frontal corona radiata, suspicious for an acute ischemic infarct. MRA head: 1. Acute occlusion of a proximal right M2 branch, superior division. 2. Additional moderate proximal right M2 stenosis, inferior division. 3. Severe distal right P2 stenosis, with additional moderate left P2 stenosis. 4. Focal severe right A1 stenosis. Patient is seen by neurology, due to large amount of vaginal bleeding, anticoagulation was not able to start. Patient continued to have vaginal bleeding while in the hospital. D&C was  performed on 9/7 for vaginal bleeding.  9/11.  Reviewed that patient received 5 units of packed red blood cells during the hospital course already.  Will give another unit of packed red blood cells today for a heme globin that dipped down to 7.9.  Case discussed with gynecology and interventional radiology and they did a bilateral gonadal artery embolization and IVC filter.  Stopped heparin drip with continued vaginal bleeding. 9/12.  Called this morning with rapid atrial fibrillation.  When I saw the patient she was drooling out of the left side of her mouth and had left arm and left leg weakness.  Case discussed with Dr. Selina Gilmore neurology who came and saw the patient quickly and a code stroke was called.  CT angio showed unchanged occlusion of the right MCA proximal superior M2 division, unchanged moderate stenosis of the right MCA proximal inferior M2 division, unchanged severe stenosis of the right PCA P2 division, severe stenosis of the proximal left cervical ICA, no core infarct on the CT perfusion, 9 mm ischemic penumbra in the right frontal operculum.  CT head shows evolving late subacute infarct of the right frontal operculum.  Case discussed with neurology and will give aspirin.  Unable to give major blood thinner right now with her vaginal bleeding.  MRI of the brain shows acute infarcts in the right frontal lobe and right anterior parietal lobe with majority of the infarct new compared to 07/09/2023. 9/13.  Patient still with left-sided weakness and difficulty swallowing.  Patient confirmed DNR and DNI status.  The patient does not want a feeding tube.  Diet as tolerated.  Speech therapy did not think that she was going to keep up with her  nutritional needs.  The patient was interested in hospice care.  Patient accepted to hospice home.  Assessment and Plan: * Hospice care patient Discharged to hospice home for end-of-life care.  Acute CVA (cerebrovascular accident) [facility]) Patient had a stroke on  9/4 and again on 9/12.  Patient has left-sided weakness and difficulty swallowing.  The patient did not want a feeding tube.  Confirmed DNR/DNI status.  Hospice referral was made and patient accepted to the hospice home.  Declined aspirin at this point. Not a candidate for major anticoagulation secondary to vaginal bleeding.  Atrial fibrillation with RVR (HCC) Atrial fibrillation with rapid ventricular response.  On metoprolol and Cardizem.  Unable to give Eliquis at this point.  Patient refused metoprolol and Cardizem so we will stop these medications also.  ABLA (acute blood loss anemia) Postmenopausal vaginal bleeding. Patient received 6 units of packed red blood cells during the hospital course.  Patient had bilateral gonadal artery embolization 9/11 by interventional radiology.  This morning's hemoglobin at 8.5.    Hypotension Resolved  Thrombocytopenia (HCC) Platelet count 86 today.   Acute kidney injury superimposed on CKD (HCC) Creatinine peaked at 1.76 on 9/8.  Creatinine down to 1.36 today.  Hypernatremia Resolved  Malnutrition of moderate degree Diet and supplements as tolerated  B12 deficiency anemia Discontinued B12 supplementation  Chronic systolic (congestive) heart failure (HCC) EF 30% 2021 Clinically euvolemic          Consultants: Neurology, cardiology, palliative care, hospice, interventional radiology, gynecology Procedures performed: D&C, bilateral gonadal artery embolization by interventional radiology, IVC filter placement Disposition: Hospice home Diet recommendation:  Dysphagia 1 diet with thin liquids or any pleasure feeding fluids at this point in time DISCHARGE MEDICATION: Allergies as of 07/18/2023   No Known Allergies      Medication List     STOP taking these medications    amiodarone 200 MG tablet Commonly known as: PACERONE   atorvastatin 40 MG tablet Commonly known as: LIPITOR   Eliquis 2.5 MG Tabs tablet Generic drug:  apixaban   furosemide 40 MG tablet Commonly known as: LASIX   metoprolol tartrate 25 MG tablet Commonly known as: LOPRESSOR   potassium chloride 20 MEQ packet Commonly known as: KLOR-CON   sacubitril-valsartan 24-26 MG Commonly known as: ENTRESTO   spironolactone 25 MG tablet Commonly known as: ALDACTONE   torsemide 20 MG tablet Commonly known as: DEMADEX       TAKE these medications    acetaminophen 325 MG tablet Commonly known as: TYLENOL Take 2 tablets (650 mg total) by mouth every 4 (four) hours as needed for mild pain (or temp > 37.5 C (99.5 F)).   antiseptic oral rinse Liqd Apply 15 mLs topically as needed for dry mouth.   Chlorhexidine Gluconate Cloth 2 % Pads Apply 6 each topically daily at 6 (six) AM. Start taking on: July 19, 2023   feeding supplement Liqd Take 237 mLs by mouth 3 (three) times daily between meals.   glycopyrrolate 0.2 MG/ML injection Commonly known as: ROBINUL Inject 1 mL (0.2 mg total) into the skin every 4 (four) hours as needed (excessive secretions).   haloperidol 2 MG/ML solution Commonly known as: HALDOL Place 0.5 mLs (1 mg total) under the tongue every 4 (four) hours as needed for agitation (or delirium).   morphine CONCENTRATE 10 mg / 0.5 ml concentrated solution Take 0.25 mLs (5 mg total) by mouth every 2 (two) hours as needed for severe pain or shortness of breath.   ondansetron  4 MG disintegrating tablet Commonly known as: ZOFRAN-ODT Take 1 tablet (4 mg total) by mouth every 6 (six) hours as needed for nausea.   polyvinyl alcohol 1.4 % ophthalmic solution Commonly known as: LIQUIFILM TEARS Place 1 drop into both eyes 4 (four) times daily as needed for dry eyes.        Discharge Exam: Filed Weights   07/08/23 1530 07/15/23 0843 07/17/23 1141  Weight: 58.1 kg 61.4 kg 61.8 kg   Physical Exam HENT:     Head: Normocephalic.     Mouth/Throat:     Pharynx: No oropharyngeal exudate.  Eyes:     General: Lids  are normal.     Conjunctiva/sclera: Conjunctivae normal.  Cardiovascular:     Rate and Rhythm: Tachycardia present. Rhythm irregularly irregular.     Heart sounds: Normal heart sounds, S1 normal and S2 normal.  Pulmonary:     Breath sounds: No decreased breath sounds, wheezing, rhonchi or rales.  Abdominal:     Palpations: Abdomen is soft.     Tenderness: There is no abdominal tenderness.  Musculoskeletal:     Right lower leg: No swelling.     Left lower leg: No swelling.  Skin:    General: Skin is warm.     Findings: No rash.     Comments: Right groin hematoma.  Neurological:     Mental Status: She is alert.     Comments: Patient able to straight leg raise with left leg today.  Weakness left arm.      Condition at discharge: stable  The results of significant diagnostics from this hospitalization (including imaging, microbiology, ancillary and laboratory) are listed below for reference.   Imaging Studies: [place] EKG SITE RITE  Result Date: 07/18/2023 If Site Rite image not attached, placement could not be confirmed due to current cardiac rhythm.  MR BRAIN WO CONTRAST  Result Date: 07/17/2023 CLINICAL DATA:  Strokes on recent MRI, increased lethargy and weakness EXAM: MRI HEAD WITHOUT CONTRAST TECHNIQUE: Multiplanar, multiecho pulse sequences of the brain and surrounding structures were obtained without intravenous contrast. COMPARISON:  07/09/2023 MRI, correlation is also made with 07/17/2023 CT head FINDINGS: Brain: Restricted diffusion with ADC correlate in the right frontal lobe (series 5, images 38), including the right frontal operculum, as well as the more superior and posterior [place] matter and cortex, with additional lesser involvement of the right anterior parietal cortex (series 5, images 34-36). The previously noted infarct primarily involve the periventricular [place] matter, with the majority of this infarct new compared to 07/09/2023. These areas are associated with  increased T2 hyperintense signal. A new linear area of hemosiderin deposition is associated with the right lateral frontal component (series 12, image 40), which could represent petechial hemorrhage or thrombus. No acute hemorrhage, mass, mass effect, or midline shift. No hydrocephalus or extra-axial collection. Remote infarct in the right occipital lobe. Vascular: Normal arterial flow voids. Skull and upper cervical spine: Normal marrow signal. Sinuses/Orbits: No acute finding. Other: None. 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 A new linear area of hemosiderin deposition is associated with the right lateral frontal component, which could represent petechial hemorrhage or thrombus. No significant mass effect or midline shift. No evidence of hemorrhagic conversion. These results will be called to the ordering clinician or representative by the Radiologist Assistant, and communication documented in the PACS or [employer]. Electronically Signed   By: Jill Side  Vasan M.D.   On: 07/17/2023 17:54   IR EMBO ART  VEN HEMORR LYMPH EXTRAV  INC GUIDE ROADMAPPING  Result Date: 07/17/2023 INDICATION: Vaginal bleeding requiring transfusion, leiomyomatous fibroid versus endometrial neoplasm; recent diagnosis of PE/DVT with contraindication for systemic anticoagulation EXAM: IVC filter placement: 1. Ultrasound-guided puncture of the right common femoral vein 2. Catheterization and angiography of the IVC 3. Placement of IVC filter using fluoroscopic guidance Embolization of bilateral ovarian (gonadal) arteries 1. Ultrasound-guided puncture of the right common femoral artery 2. Catheterization of the aorta and pelvic angiogram 3. Catheterization and angiography of the left ovarian artery 4. Embolization of the left ovarian artery using beads and coils 5. Catheterization and angiography of the right ovarian artery 6.  Embolization of the right ovarian artery using beads and coils MEDICATIONS: Documented in the EMR ANESTHESIA/SEDATION: Moderate (conscious) sedation was employed during this procedure. A total of Versed 0.5 mg and Fentanyl 12.5 mcg was administered intravenously by the radiology nurse. Total intra-service moderate Sedation Time: 105 minutes. The patient's level of consciousness and vital signs were monitored continuously by radiology nursing throughout the procedure under my direct supervision. CONTRAST:  110 mL Omnipaque 300 FLUOROSCOPY: Radiation Exposure Index (as provided by the fluoroscopic device): 25.3 minutes (296 mGy) COMPLICATIONS: None immediate. PROCEDURE: Informed consent was obtained from the patient following explanation of the procedure, risks, benefits and alternatives. The patient understands, agrees and consents for the procedure. All questions were addressed. A time out was performed prior to the initiation of the procedure. Maximal barrier sterile technique utilized including caps, mask, sterile gowns, sterile gloves, large sterile drape, hand hygiene, and Betadine prep. The right groin and draped in the standard sterile fashion. Ultrasound was used to evaluate the right common femoral vein, which was found to be widely patent. Under ultrasound guidance, the right common femoral vein was directly punctured using a 21 gauge micropuncture set. Wire was advanced centrally into the IVC, and the tract was serially dilated to accommodate the introducer sheath for the IVC filter. The introducer sheath of a Denali IVC filter with its flush inner dilator was then advanced over the wire to the iliocaval confluence. Angiography of the inferior vena cava was then performed. This demonstrates a normal caliber IVC which is widely patent without thrombus. Position of the inferior-most renal veins was noted. Under careful fluoroscopic guidance, a Denali IVC filter was deployed in an infrarenal location. Proper  deployment was confirmed with radiography. After deployment of the IVC filter, attention was turned to embolization of the uterus. Ultrasound was used to evaluate the right common femoral artery, which found to be patent and suitable for access. An ultrasound image was permanently stored in the electronic medical record. Using ultrasound guidance, the right common femoral artery was directly punctured using a 21 gauge micropuncture set. An 018 wire was advanced centrally, followed by serial tract dilation and placement of a 5 French sheath. Using combination of a Bentson guidewire in pigtail catheter, the inferior abdominal aorta was catheterized, and pelvic angiogram performed in the AP projection. This demonstrated patent iliac vessels. No significant uterine artery vessels are identified, consistent with recent CT angiography findings. Therefore, the decision was made to proceed with embolization of the bilateral ovarian arteries, as these would appear to be the predominant supply to the uterus. Using a 5 [place] Mickelson catheter, the left ovarian artery was successfully catheterized. Angiography demonstrated a hypertrophied and tortuous ovarian artery that supplies the uterus. Using a combination of a 2.4 [place] Progreat Omega microcatheter  and fathom 16 microwire, microcatheter was advanced to the mid ovarian artery. Embolization was then performed using combination of 500-700 and 700-900 um embospheres, followed by both 4 mm and packing Ruby coils. Post embolization angiography demonstrated near-complete cessation of flow to the left ovarian artery. Again using a 5 [place] Mickelson catheter, the right ovarian artery was then successfully catheterized. Angiography again demonstrated a hypertrophied and tortuous ovarian artery supplying the uterus. Using a combination of a 2.4 [place] Progreat Omega microcatheter and fathom 16 microwire, microcatheter was advanced to the mid ovarian artery. Embolization was then  performed using combination of 500-700 and 700-900 um embospheres, followed by both 4 mm and packing Ruby coils. Post embolization angiography demonstrated near-complete cessation of flow to the right ovarian artery. At the end the procedure, all catheters and wires were removed. Hemostasis was achieved at the arterial access site using the Angio-Seal vascular closure device. Hemostasis was achieved at the venous access site using manual pressure. Sterile dressings was placed. The patient tolerated the procedure well without immediate complication. IMPRESSION: 1. Successful embolization of the bilateral ovarian arteries using the embospheres and coils. 2. Successful placement of an infrarenal IVC filter. Note that this filter is potentially retrievable if the risk of thromboembolism decreases, or the patient becomes a candidate to resume oral anticoagulation. Electronically Signed   By: Olive Bass M.D.   On: 07/17/2023 10:19   IR IVC FILTER PLMT / S&I Lenise Arena GUID/MOD SED  Result Date: 07/17/2023 INDICATION: Vaginal bleeding requiring transfusion, leiomyomatous fibroid versus endometrial neoplasm; recent diagnosis of PE/DVT with contraindication for systemic anticoagulation EXAM: IVC filter placement: 1. Ultrasound-guided puncture of the right common femoral vein 2. Catheterization and angiography of the IVC 3. Placement of IVC filter using fluoroscopic guidance Embolization of bilateral ovarian (gonadal) arteries 1. Ultrasound-guided puncture of the right common femoral artery 2. Catheterization of the aorta and pelvic angiogram 3. Catheterization and angiography of the left ovarian artery 4. Embolization of the left ovarian artery using beads and coils 5. Catheterization and angiography of the right ovarian artery 6. Embolization of the right ovarian artery using beads and coils MEDICATIONS: Documented in the EMR ANESTHESIA/SEDATION: Moderate (conscious) sedation was employed during this procedure. A total of  Versed 0.5 mg and Fentanyl 12.5 mcg was administered intravenously by the radiology nurse. Total intra-service moderate Sedation Time: 105 minutes. The patient's level of consciousness and vital signs were monitored continuously by radiology nursing throughout the procedure under my direct supervision. CONTRAST:  110 mL Omnipaque 300 FLUOROSCOPY: Radiation Exposure Index (as provided by the fluoroscopic device): 25.3 minutes (296 mGy) COMPLICATIONS: None immediate. PROCEDURE: Informed consent was obtained from the patient following explanation of the procedure, risks, benefits and alternatives. The patient understands, agrees and consents for the procedure. All questions were addressed. A time out was performed prior to the initiation of the procedure. Maximal barrier sterile technique utilized including caps, mask, sterile gowns, sterile gloves, large sterile drape, hand hygiene, and Betadine prep. The right groin and draped in the standard sterile fashion. Ultrasound was used to evaluate the right common femoral vein, which was found to be widely patent. Under ultrasound guidance, the right common femoral vein was directly punctured using a 21 gauge micropuncture set. Wire was advanced centrally into the IVC, and the tract was serially dilated to accommodate the introducer sheath for the IVC filter. The introducer sheath of a Denali IVC filter with its flush inner dilator was then advanced over the wire to the iliocaval confluence. Angiography of the  inferior vena cava was then performed. This demonstrates a normal caliber IVC which is widely patent without thrombus. Position of the inferior-most renal veins was noted. Under careful fluoroscopic guidance, a Denali IVC filter was deployed in an infrarenal location. Proper deployment was confirmed with radiography. After deployment of the IVC filter, attention was turned to embolization of the uterus. Ultrasound was used to evaluate the right common femoral artery,  which found to be patent and suitable for access. An ultrasound image was permanently stored in the electronic medical record. Using ultrasound guidance, the right common femoral artery was directly punctured using a 21 gauge micropuncture set. An 018 wire was advanced centrally, followed by serial tract dilation and placement of a 5 French sheath. Using combination of a Bentson guidewire in pigtail catheter, the inferior abdominal aorta was catheterized, and pelvic angiogram performed in the AP projection. This demonstrated patent iliac vessels. No significant uterine artery vessels are identified, consistent with recent CT angiography findings. Therefore, the decision was made to proceed with embolization of the bilateral ovarian arteries, as these would appear to be the predominant supply to the uterus. Using a 5 [place] Mickelson catheter, the left ovarian artery was successfully catheterized. Angiography demonstrated a hypertrophied and tortuous ovarian artery that supplies the uterus. Using a combination of a 2.4 [place] Progreat Omega microcatheter and fathom 16 microwire, microcatheter was advanced to the mid ovarian artery. Embolization was then performed using combination of 500-700 and 700-900 um embospheres, followed by both 4 mm and packing Ruby coils. Post embolization angiography demonstrated near-complete cessation of flow to the left ovarian artery. Again using a 5 [place] Mickelson catheter, the right ovarian artery was then successfully catheterized. Angiography again demonstrated a hypertrophied and tortuous ovarian artery supplying the uterus. Using a combination of a 2.4 [place] Progreat Omega microcatheter and fathom 16 microwire, microcatheter was advanced to the mid ovarian artery. Embolization was then performed using combination of 500-700 and 700-900 um embospheres, followed by both 4 mm and packing Ruby coils. Post embolization angiography demonstrated near-complete cessation of flow to the  right ovarian artery. At the end the procedure, all catheters and wires were removed. Hemostasis was achieved at the arterial access site using the Angio-Seal vascular closure device. Hemostasis was achieved at the venous access site using manual pressure. Sterile dressings was placed. The patient tolerated the procedure well without immediate complication. IMPRESSION: 1. Successful embolization of the bilateral ovarian arteries using the embospheres and coils. 2. Successful placement of an infrarenal IVC filter. Note that this filter is potentially retrievable if the risk of thromboembolism decreases, or the patient becomes a candidate to resume oral anticoagulation. Electronically Signed   By: Olive Bass M.D.   On: 07/17/2023 10:19   CT ANGIO HEAD NECK W WO CM W PERF (CODE STROKE)  Result Date: 07/17/2023 CLINICAL DATA:  Neuro deficit, acute, stroke suspected L sided weakness. EXAM: CT ANGIOGRAPHY HEAD AND NECK CT PERFUSION BRAIN TECHNIQUE: Multidetector CT imaging of the head and neck was performed using the standard protocol during bolus administration of intravenous contrast. Multiplanar CT image reconstructions and MIPs were obtained to evaluate the vascular anatomy. Carotid stenosis measurements (when applicable) are obtained utilizing NASCET criteria, using the distal internal carotid diameter as the denominator. Multiphase CT imaging of the brain was performed following IV bolus contrast injection. Subsequent parametric perfusion maps were calculated using RAPID software. RADIATION DOSE REDUCTION: This exam was performed according to the departmental dose-optimization program which includes automated exposure control, adjustment of the mA  and/or kV according to patient size and/or use of iterative reconstruction technique. CONTRAST:  OMNIPAQUE IOHEXOL 350 MG/ML SOLN COMPARISON:  MRI/MRA head 07/09/2023. FINDINGS: CTA NECK FINDINGS Aortic arch: Three-vessel arch configuration. Atherosclerotic  calcifications of the aortic arch and arch vessel origins. Arch vessel origins are patent. Right carotid system: No evidence of dissection, stenosis (50% or greater) or occlusion. Mild calcified plaque along the right carotid bulb. Left carotid system: Mixed plaque results in severe stenosis of the proximal left cervical ICA (difficult to measure due to blooming artifact and severity of stenosis). Vertebral arteries: Codominant. No evidence of dissection, stenosis (50% or greater) or occlusion. Skeleton: Unremarkable. Other neck: Unremarkable. Upper chest: Small left pleural effusion. Review of the MIP images confirms the above findings CTA HEAD FINDINGS Anterior circulation: Calcified plaque along the carotid siphons without hemodynamically significant stenosis. 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 Unchanged moderate stenosis of the right MCA proximal inferior M2 division. The left MCA and bilateral ACAs are patent proximally without stenosis or aneurysm. Mild paucity of distal right MCA branches along the right frontal operculum. Posterior circulation: Mild luminal irregularity of the basilar artery, likely atherosclerotic in etiology. The SCAs, AICAs and PICAs are patent proximally. Unchanged moderate stenosis of the bilateral PCA origins. Unchanged severe stenosis of the right PCA P2 segment. Slight asymmetric paucity of distal right PCA branches. Venous sinuses: As permitted by contrast timing, patent. Anatomic variants: None. Review of the MIP images confirms the above findings CT Brain Perfusion Findings: CBF (<30%) Volume: 0mL Perfusion (Tmax>6.0s) volume: 0mL (post processing identified 9 mm ischemic penumbra in the right frontal operculum, which corresponds to the subacute infarct seen on same day noncontrast head CT). Mismatch Volume: 0mL Infarction Location:Not applicable. IMPRESSION: 1. 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 2. Unchanged moderate stenosis of the right MCA proximal inferior M2 division. 3. Unchanged severe stenosis of the right PCA P2 segment. 4. Severe stenosis of the proximal left cervical ICA. 5. No core infarct on CT perfusion. Automated post processing identified 9 mm ischemic penumbra in the right frontal operculum, which corresponds to the subacute infarct seen on same day noncontrast head CT. Aortic Atherosclerosis (ICD10-I70.0). Code stroke imaging results were communicated on 07/17/2023 at 9:36 am to provider Dr. Selina Gilmore via secure text paging. Electronically Signed   By: Orvan Falconer M.D.   On: 07/17/2023 09:42   CT HEAD CODE STROKE WO CONTRAST`  Result Date: 07/17/2023 CLINICAL DATA:  Code stroke. Neuro deficit, acute, stroke suspected L sided weakness. EXAM: CT HEAD WITHOUT CONTRAST TECHNIQUE: Contiguous axial images were obtained from the base of the skull through the vertex without intravenous contrast. RADIATION DOSE REDUCTION: This exam was performed according to the departmental dose-optimization program which includes automated exposure control, adjustment of the mA and/or kV according to patient size and/or use of iterative reconstruction technique. COMPARISON:  Head CT 07/08/2023.  MRI/MRA brain 07/09/2023. FINDINGS: Brain: No acute hemorrhage. 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 No new loss of gray-Whang differentiation. No hydrocephalus or extra-axial collection. No mass effect or midline shift. Vascular: No hyperdense vessel or unexpected calcification. Skull: No calvarial fracture or suspicious bone lesion. Skull base is unremarkable. Sinuses/Orbits: No acute finding. Other: None. ASPECTS [facility] Stroke Program Early CT Score) - Ganglionic level infarction (caudate, lentiform nuclei, internal capsule, insula, M1-M3 cortex): 7 - Supraganglionic infarction (M4-M6 cortex): 3 Total score (0-10 with 10 being normal): 10  IMPRESSION: 1. No acute  intracranial hemorrhage or evidence of new acute large vessel territory infarct. ASPECT score is 10. 2. 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 Code stroke imaging results were communicated on 07/17/2023 at 8:51 am to provider Dr. Selina Gilmore via secure text paging. Electronically Signed   By: Orvan Falconer M.D.   On: 07/17/2023 08:53   CT Angio Abd/Pel w/ and/or w/o  Result Date: 07/15/2023 CLINICAL DATA:  Postmenopausal bleeding EXAM: CTA ABDOMEN AND PELVIS WITHOUT AND WITH CONTRAST TECHNIQUE: Multidetector CT imaging of the abdomen and pelvis was performed using the standard protocol during bolus administration of intravenous contrast. Multiplanar reconstructed images and MIPs were obtained and reviewed to evaluate the vascular anatomy. RADIATION DOSE REDUCTION: This exam was performed according to the departmental dose-optimization program which includes automated exposure control, adjustment of the mA and/or kV according to patient size and/or use of iterative reconstruction technique. CONTRAST:  75mL OMNIPAQUE IOHEXOL 350 MG/ML SOLN COMPARISON:  None Available. FINDINGS: VASCULAR Aorta: No aneurysm or dissection. Extensive atherosclerotic calcification. Celiac: Patent without evidence of aneurysm, dissection, vasculitis or significant stenosis. SMA: Patent without evidence of aneurysm, dissection, vasculitis or significant stenosis. Renals: Both renal arteries are patent without evidence of aneurysm, dissection, vasculitis, fibromuscular dysplasia or significant stenosis. IMA: Patent without evidence of aneurysm, dissection, vasculitis or significant stenosis. Inflow: Patent without evidence of aneurysm, dissection, vasculitis or significant stenosis. Proximal Outflow: Bilateral common femoral and visualized portions of the superficial and profunda femoral arteries are patent without evidence of aneurysm, dissection, vasculitis or significant stenosis. Veins: Filling defect within  the left common femoral vein at axial image # 68/15 is in keeping with a focal deep venous thrombus. A filling defect is also incidentally noted within the posterior segmental pulmonary artery of the right lower lobe the superior margin of the examination (image # 1/12) in keeping with an acute pulmonary embolus. There is marked mass effect upon the right external iliac vein by the enlarged uterus, best seen on image # 859/15. Review of the MIP images confirms the above findings. NON-VASCULAR Lower chest: As noted above, a acute pulmonary embolus partially visualized within the right lower lobe. Cardiac size is mildly enlarged. Hepatobiliary: No focal liver abnormality is seen. No gallstones, gallbladder wall thickening, or biliary dilatation. Pancreas: Unremarkable Spleen: Unremarkable Adrenals/Urinary Tract: The adrenal glands are unremarkable. The kidneys are normal. Foley catheter balloon is seen looped within the bladder lumen which is partially decompressed. Stomach/Bowel: Stomach is within normal limits. Appendix appears normal. No evidence of bowel wall thickening, distention, or inflammatory changes. Lymphatic: No pathologic adenopathy within the abdomen and pelvis. Reproductive: The uterus is enlarged and lobulated with multiple heterogeneously hypoenhancing masses likely representing multiple uterine fibroids. The endometrial cavity appears distended and there is heterogeneous intraluminal contents, best seen on sagittal image # 82/11 which may represent blood product or soft tissue as could be seen endometrial carcinoma. This hyperdense material appears to extend into the endocervical canal, but is not well assessed on this examination. No adnexal masses are seen. Other: Tiny fat containing umbilical hernia. Musculoskeletal: No acute bone abnormality. No lytic or blastic bone lesion. IMPRESSION: 1. Acute pulmonary embolus within the posterior segmental pulmonary artery of the right lower lobe. 2. Focal  deep venous thrombus within the left common femoral vein. 3. Marked mass effect upon the right external iliac vein by the enlarged uterus. 4. Enlarged, lobulated uterus with multiple heterogeneously hypoenhancing masses likely representing multiple uterine fibroids. 5. Distended endometrial cavity with  heterogeneous intraluminal contents which may represent blood product or soft tissue as could be seen endometrial carcinoma. This hyperdense material appears to extend into the endocervical canal, but is not well assessed on this examination. Correlation with dedicated pelvic sonography may be helpful for further evaluation. Aortic Atherosclerosis (ICD10-I70.0). Electronically Signed   By: Helyn Numbers M.D.   On: 07/15/2023 20:12   US Carotid Bilateral  Result Date: 07/12/2023 CLINICAL DATA:  Stroke.  History of hypertension and hyperlipidemia. EXAM: BILATERAL CAROTID DUPLEX ULTRASOUND TECHNIQUE: Wallace Cullens scale imaging, color Doppler and duplex ultrasound were performed of bilateral carotid and vertebral arteries in the neck. COMPARISON:  None Available. FINDINGS: Criteria: Quantification of carotid stenosis is based on velocity parameters that correlate the residual internal carotid diameter with NASCET-based stenosis levels, using the diameter of the distal internal carotid lumen as the denominator for stenosis measurement. The following velocity measurements were obtained: RIGHT ICA: 103/17 cm/sec CCA: 95/8 cm/sec SYSTOLIC ICA/CCA RATIO:  1.1 ECA: 159 cm/sec LEFT ICA: 147/42 cm/sec CCA: 116/14 cm/sec SYSTOLIC ICA/CCA RATIO:  0.3 ECA: 105 cm/sec RIGHT CAROTID ARTERY: There is a moderate amount of eccentric echogenic plaque within the distal aspect of the right common carotid artery (image 11). There is a moderate amount of eccentric echogenic plaque within the right carotid bulb (images 14 and 15), extending to involve the origin and proximal aspects of the right internal carotid artery (image 22), not resulting in  elevated peak systolic velocities within the interrogated course of the right internal carotid artery to suggest a hemodynamically significant stenosis. RIGHT VERTEBRAL ARTERY:  Antegrade flow LEFT CAROTID ARTERY: There is a moderate amount of eccentric echogenic partially shadowing plaque within left carotid bulb (images 45 and 46), extending to involve the origin and proximal aspects of the right internal carotid artery (image 53), which results in elevated peak systolic velocities within the proximal and mid aspects of the left internal carotid artery. Greatest acquired peak systolic velocity within the proximal left ICA measures 147 centimeters/second (image 55). LEFT VERTEBRAL ARTERY:  Antegrade flow IMPRESSION: 1. Moderate amount of left-sided atherosclerotic plaque results in elevated peak systolic velocities of the left internal carotid artery compatible with the 50-69% luminal narrowing range. Further evaluation with CTA could performed as indicated. 2. Moderate amount of right-sided atherosclerotic plaque, not definitively resulting in a hemodynamically significant stenosis. Electronically Signed   By: Simonne Come M.D.   On: 07/12/2023 13:04   ECHOCARDIOGRAM COMPLETE  Result Date: 07/09/2023    ECHOCARDIOGRAM REPORT   Patient Name:   [facility] Date of Exam: 07/09/2023 Medical Rec #:  [id number]          Height:       64.0 in Accession #:    [id number]         Weight:       128.0 lb Date of Birth:  09/28/1931          BSA:          1.618 m Patient Age:    89 years           BP:           101/62 mmHg Patient Gender: F                  HR:           119 bpm. Exam Location:  [facility] Procedure: 2D Echo, Cardiac Doppler and Color Doppler Indications:     Stroke  History:  Patient has no prior history of Echocardiogram examinations.                  Stroke, Arrythmias:Atrial Fibrillation, Signs/Symptoms:Dyspnea                  and Fatigue; Risk Factors:Hypertension and Dyslipidemia.  Sonographer:      Mikki Harbor Referring Phys:  [id number] Andris Baumann Diagnosing Phys: [name] MD IMPRESSIONS  1. Left ventricular ejection fraction, by estimation, is 55 to 60%. The left ventricle has normal function. The left ventricle has no regional wall motion abnormalities. Left ventricular diastolic function could not be evaluated.  2. Right ventricular systolic function is normal. The right ventricular size is normal. There is moderately elevated pulmonary artery systolic pressure.  3. Left atrial size was mildly dilated.  4. The mitral valve is degenerative. Mild to moderate mitral valve regurgitation.  5. Tricuspid valve regurgitation is moderate.  6. The aortic valve is normal in structure. Aortic valve regurgitation is trivial. FINDINGS  Left Ventricle: Left ventricular ejection fraction, by estimation, is 55 to 60%. The left ventricle has normal function. The left ventricle has no regional wall motion abnormalities. The left ventricular internal cavity size was normal in size. There is  no left ventricular hypertrophy. Left ventricular diastolic function could not be evaluated. Right Ventricle: The right ventricular size is normal. No increase in right ventricular wall thickness. Right ventricular systolic function is normal. There is moderately elevated pulmonary artery systolic pressure. The tricuspid regurgitant velocity is 3.32 m/s, and with an assumed right atrial pressure of 8 mmHg, the estimated right ventricular systolic pressure is 52.1 mmHg. Left Atrium: Left atrial size was mildly dilated. Right Atrium: Right atrial size was normal in size. Pericardium: There is no evidence of pericardial effusion. Mitral Valve: Eccentric posterior jet. The mitral valve is degenerative in appearance. Mild to moderate mitral valve regurgitation. MV peak gradient, 4.0 mmHg. The mean mitral valve gradient is 2.0 mmHg. Tricuspid Valve: Eccentric posterior jet. The tricuspid valve is grossly normal. Tricuspid valve  regurgitation is moderate. Aortic Valve: The aortic valve is normal in structure. Aortic valve regurgitation is trivial. Aortic valve mean gradient measures 5.7 mmHg. Aortic valve peak gradient measures 11.9 mmHg. Aortic valve area, by VTI measures 1.57 cm. Pulmonic Valve: The pulmonic valve was normal in structure. Pulmonic valve regurgitation is not visualized. Aorta: The ascending aorta was not well visualized. IAS/Shunts: No atrial level shunt detected by color flow Doppler.  LEFT VENTRICLE PLAX 2D LVIDd:         4.40 cm LVIDs:         3.10 cm LV PW:         1.10 cm LV IVS:        0.80 cm LVOT diam:     2.00 cm LV SV:         50 LV SV Index:   31 LVOT Area:     3.14 cm  RIGHT VENTRICLE RV Basal diam:  3.60 cm RV Mid diam:    2.70 cm RV S prime:     13.40 cm/s LEFT ATRIUM             Index        RIGHT ATRIUM           Index LA diam:        4.30 cm 2.66 cm/m   RA Area:     22.30 cm LA Vol (A2C):   93.7 ml 57.90  ml/m  RA Volume:   64.70 ml  39.98 ml/m LA Vol (A4C):   78.7 ml 48.63 ml/m LA Biplane Vol: 85.8 ml 53.02 ml/m  AORTIC VALVE                     PULMONIC VALVE AV Area (Vmax):    1.64 cm      PV Vmax:       1.32 m/s AV Area (Vmean):   1.44 cm      PV Peak grad:  7.0 mmHg AV Area (VTI):     1.57 cm AV Vmax:           172.67 cm/s AV Vmean:          110.000 cm/s AV VTI:            0.319 m AV Peak Grad:      11.9 mmHg AV Mean Grad:      5.7 mmHg LVOT Vmax:         90.20 cm/s LVOT Vmean:        50.450 cm/s LVOT VTI:          0.159 m LVOT/AV VTI ratio: 0.50  AORTA Ao Root diam: 3.00 cm MITRAL VALVE               TRICUSPID VALVE MV Area (PHT): 5.62 cm    TR Peak grad:   44.1 mmHg MV Area VTI:   2.93 cm    TR Vmax:        332.00 cm/s MV Peak grad:  4.0 mmHg MV Mean grad:  2.0 mmHg    SHUNTS MV Vmax:       1.00 m/s    Systemic VTI:  0.16 m MV Vmean:      58.3 cm/s   Systemic Diam: 2.00 cm MV Decel Time: 135 msec MV E velocity: 91.70 cm/s [name] MD Electronically signed by [name] MD  Signature Date/Time: 07/09/2023/1:15:37 PM    Final    MR BRAIN WO CONTRAST  Addendum Date: 07/09/2023   ADDENDUM REPORT: 07/09/2023 03:11 ADDENDUM: In addition to the initially described findings, note is made of a 9 mm T1 hypointense lesion within the C4 vertebral body, indeterminate. This is described in the body of the report, omitted from the impression by mistake. Further evaluation with dedicated MRI of the cervical spine, with and without contrast, suggested for further evaluation as warranted. Electronically Signed   By: Rise Mu M.D.   On: 07/09/2023 03:11   Result Date: 07/09/2023 CLINICAL DATA:  Initial evaluation for neuro deficit, stroke suspected. Left-sided deficit. EXAM: MRI HEAD WITHOUT CONTRAST MRA HEAD WITHOUT CONTRAST TECHNIQUE: Multiplanar, multi-echo pulse sequences of the brain and surrounding structures were acquired without intravenous contrast. Angiographic images of the Circle of Willis were acquired using MRA technique without intravenous contrast. COMPARISON:  Comparison made with prior CT from 07/08/2023. FINDINGS: MRI HEAD FINDINGS Brain: Cerebral volume within normal limits. Minimal hazy FLAIR signal abnormality noted involving the periventricular [place] matter, likely related to chronic microvascular ischemic disease, minimal in nature and less than is typically seen for age. Tiny remote infarct present at the right occipital lobe (series 20, image 21). Additional tiny remote left cerebellar infarct noted. Subtle focus of diffusion signal abnormality measuring approximately 1.5 cm seen at the right frontal corona radiata (series 10, image 78). Associated subtle signal loss on ADC map (series 11, image 30). Findings suspicious for a possible acute ischemic infarct. No associated  hemorrhage or mass effect. No other evidence for acute or subacute ischemia. Gray-Thornton matter differentiation otherwise maintained. No areas of chronic cortical infarction. No significant acute  or chronic intracranial blood products. No mass lesion, midline shift or mass effect. No hydrocephalus or extra-axial fluid collection. Pituitary gland and suprasellar region within normal limits. Vascular: Major intracranial vascular flow voids are maintained. Skull and upper cervical spine: Craniocervical junction within normal limits. 9 mm T1 hypointense lesion noted within the visualized C4 vertebral body (series 14, image 12), indeterminate. Bone marrow signal intensity otherwise normal. No scalp soft tissue abnormality. Sinuses/Orbits: Prior ocular lens replacement on the right. Globes and orbital soft tissues otherwise unremarkable. Paranasal sinuses are largely clear. No significant mastoid effusion. Other: None. MRA HEAD FINDINGS Anterior circulation: Visualized distal cervical segments of the internal carotid arteries are mildly tortuous but patent with antegrade flow. Petrous, cavernous, and supraclinoid segments patent. Left A1 segment widely patent. Focal severe right A1 stenosis (series 96295, image 8). Normal anterior communicating artery complex. Anterior cerebral arteries patent without stenosis. No M1 stenosis or occlusion. Left MCA branches well perfused. On the right, there is acute occlusion of a proximal right M2 branch, superior division (series 28413, image 13). Inferior division remains patent and perfused, although a probable moderate proximal right M2 stenosis noted (series 24401, image 12). Posterior circulation: Both vertebral arteries are patent to the vertebrobasilar junction without visible stenosis. Right PICA patent. Left PICA not well seen. Basilar patent without stenosis. Superior cerebral arteries patent bilaterally. Both PCAs primarily supplied via the basilar. Focal moderate left P2 stenosis (series 1021, image 2). Left PCA otherwise patent. On the right, there is a severe distal right P2 stenosis (series 1021, image 7). Right PCA is markedly attenuated distally and only  faintly visible on 3D time-of-flight sequence. Anatomic variants: None significant.  No aneurysm. IMPRESSION: MRI HEAD IMPRESSION: 1. Subtle 1.5 cm focus of diffusion signal abnormality involving the right frontal corona radiata, suspicious for an acute ischemic infarct. No associated hemorrhage or mass effect. 2. 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 MRA HEAD IMPRESSION: 1. Acute occlusion of a proximal right M2 branch, superior division. 2. Additional moderate proximal right M2 stenosis, inferior division. 3. Severe distal right P2 stenosis, with additional moderate left P2 stenosis. 4. Focal severe right A1 stenosis. Electronically Signed: By: Rise Mu M.D. On: 07/09/2023 03:05   MR ANGIO HEAD WO CONTRAST  Addendum Date: 07/09/2023   ADDENDUM REPORT: 07/09/2023 03:11 ADDENDUM: In addition to the initially described findings, note is made of a 9 mm T1 hypointense lesion within the C4 vertebral body, indeterminate. This is described in the body of the report, omitted from the impression by mistake. Further evaluation with dedicated MRI of the cervical spine, with and without contrast, suggested for further evaluation as warranted. Electronically Signed   By: Rise Mu M.D.   On: 07/09/2023 03:11   Result Date: 07/09/2023 CLINICAL DATA:  Initial evaluation for neuro deficit, stroke suspected. Left-sided deficit. EXAM: MRI HEAD WITHOUT CONTRAST MRA HEAD WITHOUT CONTRAST TECHNIQUE: Multiplanar, multi-echo pulse sequences of the brain and surrounding structures were acquired without intravenous contrast. Angiographic images of the Circle of Willis were acquired using MRA technique without intravenous contrast. COMPARISON:  Comparison made with prior CT from 07/08/2023. FINDINGS: MRI HEAD FINDINGS Brain: Cerebral volume within normal limits. Minimal hazy FLAIR signal abnormality noted involving the periventricular  [place] matter, likely related to chronic microvascular ischemic disease, minimal in nature  and less than is typically seen for age. Tiny remote infarct present at the right occipital lobe (series 20, image 21). Additional tiny remote left cerebellar infarct noted. Subtle focus of diffusion signal abnormality measuring approximately 1.5 cm seen at the right frontal corona radiata (series 10, image 78). Associated subtle signal loss on ADC map (series 11, image 30). Findings suspicious for a possible acute ischemic infarct. No associated hemorrhage or mass effect. No other evidence for acute or subacute ischemia. Gray-[place] matter differentiation otherwise maintained. No areas of chronic cortical infarction. No significant acute or chronic intracranial blood products. No mass lesion, midline shift or mass effect. No hydrocephalus or extra-axial fluid collection. Pituitary gland and suprasellar region within normal limits. Vascular: Major intracranial vascular flow voids are maintained. Skull and upper cervical spine: Craniocervical junction within normal limits. 9 mm T1 hypointense lesion noted within the visualized C4 vertebral body (series 14, image 12), indeterminate. Bone marrow signal intensity otherwise normal. No scalp soft tissue abnormality. Sinuses/Orbits: Prior ocular lens replacement on the right. Globes and orbital soft tissues otherwise unremarkable. Paranasal sinuses are largely clear. No significant mastoid effusion. Other: None. MRA HEAD FINDINGS Anterior circulation: Visualized distal cervical segments of the internal carotid arteries are mildly tortuous but patent with antegrade flow. Petrous, cavernous, and supraclinoid segments patent. Left A1 segment widely patent. Focal severe right A1 stenosis (series 29528, image 8). Normal anterior communicating artery complex. Anterior cerebral arteries patent without stenosis. No M1 stenosis or occlusion. Left MCA branches well perfused. On the right, there  is acute occlusion of a proximal right M2 branch, superior division (series 41324, image 13). Inferior division remains patent and perfused, although a probable moderate proximal right M2 stenosis noted (series 40102, image 12). Posterior circulation: Both vertebral arteries are patent to the vertebrobasilar junction without visible stenosis. Right PICA patent. Left PICA not well seen. Basilar patent without stenosis. Superior cerebral arteries patent bilaterally. Both PCAs primarily supplied via the basilar. Focal moderate left P2 stenosis (series 1021, image 2). Left PCA otherwise patent. On the right, there is a severe distal right P2 stenosis (series 1021, image 7). Right PCA is markedly attenuated distally and only faintly visible on 3D time-of-flight sequence. Anatomic variants: None significant.  No aneurysm. IMPRESSION: MRI HEAD IMPRESSION: 1. Subtle 1.5 cm focus of diffusion signal abnormality involving the right frontal corona radiata, suspicious for an acute ischemic infarct. No associated hemorrhage or mass effect. 2. 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 MRA HEAD IMPRESSION: 1. Acute occlusion of a proximal right M2 branch, superior division. 2. Additional moderate proximal right M2 stenosis, inferior division. 3. Severe distal right P2 stenosis, with additional moderate left P2 stenosis. 4. Focal severe right A1 stenosis. Electronically Signed: By: Rise Mu M.D. On: 07/09/2023 03:05   US PELVIC COMPLETE WITH TRANSVAGINAL  Result Date: 07/08/2023 CLINICAL DATA:  Vaginal bleeding in a 89 year old female EXAM: TRANSABDOMINAL ULTRASOUND OF PELVIS TECHNIQUE: Transabdominal ultrasound examination of the pelvis was performed including evaluation of the uterus, ovaries, adnexal regions, and pelvic cul-de-sac. Patient declined transvaginal exam. COMPARISON:  None Available. FINDINGS: Uterus Measurements: 50.3 x 6.6 x  8.6 cm = volume: 452 mL. Enlarged heterogenous uterus with multiple fibroids measuring up to 7.8 cm. Endometrium Thickness: 24 mm. Thickened heterogenous endometrium without definite focal lesion. Right ovary Not visualized due to enlarged heterogenous uterus and overlying bowel. Left ovary Not visualized due to enlarged heterogenous uterus and overlying. Other findings:  No abnormal free  fluid. IMPRESSION: 1. Thickened heterogenous endometrium measuring up to 24 mm. In the setting of post-menopausal bleeding, endometrial sampling is indicated to exclude carcinoma. If results are benign, sonohysterogram should be considered for focal lesion work-up. (Ref: Radiological Reasoning: Algorithmic Workup of Abnormal Vaginal Bleeding with Endovaginal Sonography and Sonohysterography. AJR 2008; 161:W96-04) 2. Enlarged heterogenous uterus with multiple fibroids. 3. Nonvisualization of the ovaries. Electronically Signed   By: Minerva Fester M.D.   On: 07/08/2023 20:49   CT HEAD CODE STROKE WO CONTRAST  Result Date: 07/08/2023 CLINICAL DATA:  Code stroke.  Neuro deficit, acute, stroke suspected EXAM: CT HEAD WITHOUT CONTRAST TECHNIQUE: Contiguous axial images were obtained from the base of the skull through the vertex without intravenous contrast. RADIATION DOSE REDUCTION: This exam was performed according to the departmental dose-optimization program which includes automated exposure control, adjustment of the mA and/or kV according to patient size and/or use of iterative reconstruction technique. COMPARISON:  CT head 07/21/2017. FINDINGS: Brain: No evidence of acute infarction, hemorrhage, hydrocephalus, extra-axial collection or mass lesion/mass effect. Vascular: No hyperdense vessel or unexpected calcification. Skull: Normal. Negative for fracture or focal lesion. Sinuses/Orbits: No acute finding. Other: None. ASPECTS [facility] Stroke Program Early CT Score) Total score (0-10 with 10 being normal): 10. IMPRESSION: 1. No  evidence of acute intracranial abnormality. 2. ASPECTS is 10. Code stroke imaging results were communicated on 07/08/2023 at 8:12 pm to provider Chesley Noon via telephone, who verbally acknowledged these results. Electronically Signed   By: Feliberto Harts M.D.   On: 07/08/2023 20:13    Microbiology: Results for orders placed or performed during the hospital encounter of 07/08/23  MRSA Next Gen by PCR, Nasal     Status: None   Collection Time: 07/17/23 11:48 AM   Specimen: Nasal Mucosa; Nasal Swab  Result Value Ref Range Status   MRSA by PCR Next Gen NOT DETECTED NOT DETECTED Final    Comment: (NOTE) The GeneXpert MRSA Assay (FDA approved for NASAL specimens only), is one component of a comprehensive MRSA colonization surveillance program. It is not intended to diagnose MRSA infection nor to guide or monitor treatment for MRSA infections. Test performance is not FDA approved in patients less than 87 years old. Performed at [facility], [address] [place], [place] [address]     Labs: CBC: Recent Labs  Lab 07/11/23 1811 07/12/23 0437 07/12/23 1634 07/15/23 0441 07/15/23 1650 07/16/23 0451 07/17/23 0533 07/18/23 0513  WBC  --  11.9*  --   --   --  17.2* 18.8* 19.4*  HGB 8.9* 8.4*   < > 6.9* 8.9* 7.9* 9.7* 8.5*  HCT 26.3* 24.3*  --   --   --  24.7* 29.2* 25.9*  MCV  --  91.4  --   --   --  96.9 92.4 95.9  PLT  --  142*  --   --   --  86* 86* 86*   < > = values in this interval not displayed.   Basic Metabolic Panel: Recent Labs  Lab 07/12/23 0437 07/13/23 0438 07/14/23 0816 07/15/23 0441 07/16/23 0451 07/17/23 0533 07/18/23 0513  NA 146*   < > 146* 140 139 137 141  K 4.6   < > 4.2 4.2 4.4 4.7 4.5  CL 117*   < > 117* 115* 114* 109 113*  CO2 21*   < > 22 22 22  19* 21*  GLUCOSE 131*   < > 143* 134* 115* 122* 153*  BUN 30*   < >  38* 35* 29* 29* 32*  CREATININE 1.57*   < > 1.63* 1.41* 1.29* 1.26* 1.36*  CALCIUM 9.5   < > 9.6 9.1 8.6* 9.0 9.2  MG 2.7*   --  2.8* 2.8* 2.8*  --   --   PHOS 3.8  --   --  2.4* 2.5  --   --    < > = values in this interval not displayed.   Liver Function Tests: No results for input(s): "AST", "ALT", "ALKPHOS", "BILITOT", "PROT", "ALBUMIN" in the last 168 hours. CBG: Recent Labs  Lab 07/11/23 2209 07/17/23 0825 07/17/23 1142 07/18/23 0726  GLUCAP 121* 144* 116* 138*    Discharge time spent: greater than 30 minutes.  Signed: Alford Highland, MD [facility] 07/18/2023

## 2023-07-18 NOTE — Assessment & Plan Note (Signed)
Discharged to hospice home for end-of-life care.

## 2023-07-18 NOTE — Progress Notes (Signed)
Gynecology Note  Subjective  Marissa Gilmore is a 89 y.o. female who presented to the ED on 07/08/2023 for vaginal bleeding. In the ED she developed Afib with RVR and subsequent fascial droop and imaging confirmed an ischemic stoke.  Due to PMB, anemia, and continued bleeding she had a D&C on 07/12/23. Pathology negative for atypia / cancer. She continued to have significant vaginal bleeding and acute blood loss anemia requiring blood transfusions. On 9/10 a CTA showed an acute PE and left common femoral DVT along with an enlarged heterogenous fibroid uterus. On 9/11 she underwent bilateral ovarian artery embolization with significant improvement of her bleeding.  On 9/12, patient had worsened neurologic symptoms and a coke stroke was called. Imaging was stable and patient deemed to still not a candidate for thrombectomy. While her vaginal bleeding has stopped, her overall clinical condition has worsened and patient has elected for comfort care. She will be moving to hospice later today.   Patient states she is tired. She denies vaginal bleeding and states she is ready to go to hospice.   Notes, imaging, and labs reviewed.    Objective   Vitals:   07/18/23 1300 07/18/23 1400  BP: 125/66 (!) 121/54  Pulse: 79   Resp: 20 (!) 24  Temp:    SpO2: 99%      Intake/Output Summary (Last 24 hours) at 07/18/2023 1620 Last data filed at 07/18/2023 1119 Gross per 24 hour  Intake 960.47 ml  Output 975 ml  Net -14.53 ml    General: Alert and oriented, sitting in bed Cardiovascular: Tachycardic Pulmonary: Increased work of breathing Abdomen: Non distended Ext: No significant edema  Labs: Results for orders placed or performed during the hospital encounter of 07/08/23 (from the past 24 hour(s))  CBC     Status: Abnormal   Collection Time: 07/18/23  5:13 AM  Result Value Ref Range   WBC 19.4 (H) 4.0 - 10.5 K/uL   RBC 2.70 (L) 3.87 - 5.11 MIL/uL   Hemoglobin 8.5 (L) 12.0 - 15.0 g/dL   HCT 13.0  (L) 86.5 - 46.0 %   MCV 95.9 80.0 - 100.0 fL   MCH 31.5 26.0 - 34.0 pg   MCHC 32.8 30.0 - 36.0 g/dL   RDW 78.4 (H) 69.6 - 29.5 %   Platelets 86 (L) 150 - 400 K/uL   nRBC 0.4 (H) 0.0 - 0.2 %  Basic metabolic panel     Status: Abnormal   Collection Time: 07/18/23  5:13 AM  Result Value Ref Range   Sodium 141 135 - 145 mmol/L   Potassium 4.5 3.5 - 5.1 mmol/L   Chloride 113 (H) 98 - 111 mmol/L   CO2 21 (L) 22 - 32 mmol/L   Glucose, Bld 153 (H) 70 - 99 mg/dL   BUN 32 (H) 8 - 23 mg/dL   Creatinine, Ser 2.84 (H) 0.44 - 1.00 mg/dL   Calcium 9.2 8.9 - 13.2 mg/dL   GFR, Estimated 37 (L) >60 mL/min   Anion gap 7 5 - 15  Glucose, capillary     Status: Abnormal   Collection Time: 07/18/23  7:26 AM  Result Value Ref Range   Glucose-Capillary 138 (H) 70 - 99 mg/dL    Cultures: Results for orders placed or performed during the hospital encounter of 07/08/23  MRSA Next Gen by PCR, Nasal     Status: None   Collection Time: 07/17/23 11:48 AM   Specimen: Nasal Mucosa; Nasal Swab  Result Value Ref  Range Status   MRSA by PCR Next Gen NOT DETECTED NOT DETECTED Final    Comment: (NOTE) The GeneXpert MRSA Assay (FDA approved for NASAL specimens only), is one component of a comprehensive MRSA colonization surveillance program. It is not intended to diagnose MRSA infection nor to guide or monitor treatment for MRSA infections. Test performance is not FDA approved in patients less than 54 years old. Performed at [facility], [address] [place], [place] [address]     Imaging: [place] EKG SITE RITE  Result Date: 07/18/2023 If [facility] image not attached, placement could not be confirmed due to current cardiac rhythm.  MR BRAIN WO CONTRAST  Result Date: 07/17/2023 CLINICAL DATA:  Strokes on recent MRI, increased lethargy and weakness EXAM: MRI HEAD WITHOUT CONTRAST TECHNIQUE: Multiplanar, multiecho pulse sequences of the brain and surrounding structures were obtained without  intravenous contrast. COMPARISON:  07/09/2023 MRI, correlation is also made with 07/17/2023 CT head FINDINGS: Brain: Restricted diffusion with ADC correlate in the right frontal lobe (series 5, images 38), including the right frontal operculum, as well as the more superior and posterior [place]ete matter and cortex, with additional lesser involvement of the right anterior parietal cortex (series 5, images 34-36). The previously noted infarct primarily involve the periventricular [place] matter, with the majority of this infarct new compared to 07/09/2023. These areas are associated with increased T2 hyperintense signal. A new linear area of hemosiderin deposition is associated with the right lateral frontal component (series 12, image 40), which could represent petechial hemorrhage or thrombus. No acute hemorrhage, mass, mass effect, or midline shift. No hydrocephalus or extra-axial collection. Remote infarct in the right occipital lobe. Vascular: Normal arterial flow voids. Skull and upper cervical spine: Normal marrow signal. Sinuses/Orbits: No acute finding. Other: None. 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 A new linear area of hemosiderin deposition is associated with the right lateral frontal component, which could represent petechial hemorrhage or thrombus. No significant mass effect or midline shift. No evidence of hemorrhagic conversion. These results will be called to the ordering clinician or representative by the Radiologist Assistant, and communication documented in the PACS or [employer]. Electronically Signed   By: Wiliam Ke M.D.   On: 07/17/2023 17:54   IR EMBO ART  VEN HEMORR LYMPH EXTRAV  INC GUIDE ROADMAPPING  Result Date: 07/17/2023 INDICATION: Vaginal bleeding requiring transfusion, leiomyomatous fibroid versus endometrial neoplasm; recent diagnosis of PE/DVT with contraindication  for systemic anticoagulation EXAM: IVC filter placement: 1. Ultrasound-guided puncture of the right common femoral vein 2. Catheterization and angiography of the IVC 3. Placement of IVC filter using fluoroscopic guidance Embolization of bilateral ovarian (gonadal) arteries 1. Ultrasound-guided puncture of the right common femoral artery 2. Catheterization of the aorta and pelvic angiogram 3. Catheterization and angiography of the left ovarian artery 4. Embolization of the left ovarian artery using beads and coils 5. Catheterization and angiography of the right ovarian artery 6. Embolization of the right ovarian artery using beads and coils MEDICATIONS: Documented in the EMR ANESTHESIA/SEDATION: Moderate (conscious) sedation was employed during this procedure. A total of Versed 0.5 mg and Fentanyl 12.5 mcg was administered intravenously by the radiology nurse. Total intra-service moderate Sedation Time: 105 minutes. The patient's level of consciousness and vital signs were monitored continuously by radiology nursing throughout the procedure under my direct supervision. CONTRAST:  110 mL Omnipaque 300 FLUOROSCOPY: Radiation Exposure Index (as  provided by the fluoroscopic device): 25.3 minutes (296 mGy) COMPLICATIONS: None immediate. PROCEDURE: Informed consent was obtained from the patient following explanation of the procedure, risks, benefits and alternatives. The patient understands, agrees and consents for the procedure. All questions were addressed. A time out was performed prior to the initiation of the procedure. Maximal barrier sterile technique utilized including caps, mask, sterile gowns, sterile gloves, large sterile drape, hand hygiene, and Betadine prep. The right groin and draped in the standard sterile fashion. Ultrasound was used to evaluate the right common femoral vein, which was found to be widely patent. Under ultrasound guidance, the right common femoral vein was directly punctured using a 21  gauge micropuncture set. Wire was advanced centrally into the IVC, and the tract was serially dilated to accommodate the introducer sheath for the IVC filter. The introducer sheath of a Denali IVC filter with its flush inner dilator was then advanced over the wire to the iliocaval confluence. Angiography of the inferior vena cava was then performed. This demonstrates a normal caliber IVC which is widely patent without thrombus. Position of the inferior-most renal veins was noted. Under careful fluoroscopic guidance, a Denali IVC filter was deployed in an infrarenal location. Proper deployment was confirmed with radiography. After deployment of the IVC filter, attention was turned to embolization of the uterus. Ultrasound was used to evaluate the right common femoral artery, which found to be patent and suitable for access. An ultrasound image was permanently stored in the electronic medical record. Using ultrasound guidance, the right common femoral artery was directly punctured using a 21 gauge micropuncture set. An 018 wire was advanced centrally, followed by serial tract dilation and placement of a 5 French sheath. Using combination of a Bentson guidewire in pigtail catheter, the inferior abdominal aorta was catheterized, and pelvic angiogram performed in the AP projection. This demonstrated patent iliac vessels. No significant uterine artery vessels are identified, consistent with recent CT angiography findings. Therefore, the decision was made to proceed with embolization of the bilateral ovarian arteries, as these would appear to be the predominant supply to the uterus. Using a 5 [place] Mickelson catheter, the left ovarian artery was successfully catheterized. Angiography demonstrated a hypertrophied and tortuous ovarian artery that supplies the uterus. Using a combination of a 2.4 [place] Progreat Omega microcatheter and fathom 16 microwire, microcatheter was advanced to the mid ovarian artery. Embolization  was then performed using combination of 500-700 and 700-900 um embospheres, followed by both 4 mm and packing Ruby coils. Post embolization angiography demonstrated near-complete cessation of flow to the left ovarian artery. Again using a 5 [place] Mickelson catheter, the right ovarian artery was then successfully catheterized. Angiography again demonstrated a hypertrophied and tortuous ovarian artery supplying the uterus. Using a combination of a 2.4 [place] Progreat Omega microcatheter and fathom 16 microwire, microcatheter was advanced to the mid ovarian artery. Embolization was then performed using combination of 500-700 and 700-900 um embospheres, followed by both 4 mm and packing Ruby coils. Post embolization angiography demonstrated near-complete cessation of flow to the right ovarian artery. At the end the procedure, all catheters and wires were removed. Hemostasis was achieved at the arterial access site using the Angio-Seal vascular closure device. Hemostasis was achieved at the venous access site using manual pressure. Sterile dressings was placed. The patient tolerated the procedure well without immediate complication. IMPRESSION: 1. Successful embolization of the bilateral ovarian arteries using the embospheres and coils. 2. Successful placement of an infrarenal IVC filter. Note that this filter is  potentially retrievable if the risk of thromboembolism decreases, or the patient becomes a candidate to resume oral anticoagulation. Electronically Signed   By: Olive Bass M.D.   On: 07/17/2023 10:19   IR IVC FILTER PLMT / S&I Lenise Arena GUID/MOD SED  Result Date: 07/17/2023 INDICATION: Vaginal bleeding requiring transfusion, leiomyomatous fibroid versus endometrial neoplasm; recent diagnosis of PE/DVT with contraindication for systemic anticoagulation EXAM: IVC filter placement: 1. Ultrasound-guided puncture of the right common femoral vein 2. Catheterization and angiography of the IVC 3. Placement of IVC  filter using fluoroscopic guidance Embolization of bilateral ovarian (gonadal) arteries 1. Ultrasound-guided puncture of the right common femoral artery 2. Catheterization of the aorta and pelvic angiogram 3. Catheterization and angiography of the left ovarian artery 4. Embolization of the left ovarian artery using beads and coils 5. Catheterization and angiography of the right ovarian artery 6. Embolization of the right ovarian artery using beads and coils MEDICATIONS: Documented in the EMR ANESTHESIA/SEDATION: Moderate (conscious) sedation was employed during this procedure. A total of Versed 0.5 mg and Fentanyl 12.5 mcg was administered intravenously by the radiology nurse. Total intra-service moderate Sedation Time: 105 minutes. The patient's level of consciousness and vital signs were monitored continuously by radiology nursing throughout the procedure under my direct supervision. CONTRAST:  110 mL Omnipaque 300 FLUOROSCOPY: Radiation Exposure Index (as provided by the fluoroscopic device): 25.3 minutes (296 mGy) COMPLICATIONS: None immediate. PROCEDURE: Informed consent was obtained from the patient following explanation of the procedure, risks, benefits and alternatives. The patient understands, agrees and consents for the procedure. All questions were addressed. A time out was performed prior to the initiation of the procedure. Maximal barrier sterile technique utilized including caps, mask, sterile gowns, sterile gloves, large sterile drape, hand hygiene, and Betadine prep. The right groin and draped in the standard sterile fashion. Ultrasound was used to evaluate the right common femoral vein, which was found to be widely patent. Under ultrasound guidance, the right common femoral vein was directly punctured using a 21 gauge micropuncture set. Wire was advanced centrally into the IVC, and the tract was serially dilated to accommodate the introducer sheath for the IVC filter. The introducer sheath of a  Denali IVC filter with its flush inner dilator was then advanced over the wire to the iliocaval confluence. Angiography of the inferior vena cava was then performed. This demonstrates a normal caliber IVC which is widely patent without thrombus. Position of the inferior-most renal veins was noted. Under careful fluoroscopic guidance, a Denali IVC filter was deployed in an infrarenal location. Proper deployment was confirmed with radiography. After deployment of the IVC filter, attention was turned to embolization of the uterus. Ultrasound was used to evaluate the right common femoral artery, which found to be patent and suitable for access. An ultrasound image was permanently stored in the electronic medical record. Using ultrasound guidance, the right common femoral artery was directly punctured using a 21 gauge micropuncture set. An 018 wire was advanced centrally, followed by serial tract dilation and placement of a 5 French sheath. Using combination of a Bentson guidewire in pigtail catheter, the inferior abdominal aorta was catheterized, and pelvic angiogram performed in the AP projection. This demonstrated patent iliac vessels. No significant uterine artery vessels are identified, consistent with recent CT angiography findings. Therefore, the decision was made to proceed with embolization of the bilateral ovarian arteries, as these would appear to be the predominant supply to the uterus. Using a 5 [place] Mickelson catheter, the left ovarian artery was successfully catheterized.  Angiography demonstrated a hypertrophied and tortuous ovarian artery that supplies the uterus. Using a combination of a 2.4 [place] Progreat Omega microcatheter and fathom 16 microwire, microcatheter was advanced to the mid ovarian artery. Embolization was then performed using combination of 500-700 and 700-900 um embospheres, followed by both 4 mm and packing Ruby coils. Post embolization angiography demonstrated near-complete cessation  of flow to the left ovarian artery. Again using a 5 [place] Mickelson catheter, the right ovarian artery was then successfully catheterized. Angiography again demonstrated a hypertrophied and tortuous ovarian artery supplying the uterus. Using a combination of a 2.4 [place] Progreat Omega microcatheter and fathom 16 microwire, microcatheter was advanced to the mid ovarian artery. Embolization was then performed using combination of 500-700 and 700-900 um embospheres, followed by both 4 mm and packing Ruby coils. Post embolization angiography demonstrated near-complete cessation of flow to the right ovarian artery. At the end the procedure, all catheters and wires were removed. Hemostasis was achieved at the arterial access site using the Angio-Seal vascular closure device. Hemostasis was achieved at the venous access site using manual pressure. Sterile dressings was placed. The patient tolerated the procedure well without immediate complication. IMPRESSION: 1. Successful embolization of the bilateral ovarian arteries using the embospheres and coils. 2. Successful placement of an infrarenal IVC filter. Note that this filter is potentially retrievable if the risk of thromboembolism decreases, or the patient becomes a candidate to resume oral anticoagulation. Electronically Signed   By: Olive Bass M.D.   On: 07/17/2023 10:19   CT ANGIO HEAD NECK W WO CM W PERF (CODE STROKE)  Result Date: 07/17/2023 CLINICAL DATA:  Neuro deficit, acute, stroke suspected L sided weakness. EXAM: CT ANGIOGRAPHY HEAD AND NECK CT PERFUSION BRAIN TECHNIQUE: Multidetector CT imaging of the head and neck was performed using the standard protocol during bolus administration of intravenous contrast. Multiplanar CT image reconstructions and MIPs were obtained to evaluate the vascular anatomy. Carotid stenosis measurements (when applicable) are obtained utilizing NASCET criteria, using the distal internal carotid diameter as the denominator.  Multiphase CT imaging of the brain was performed following IV bolus contrast injection. Subsequent parametric perfusion maps were calculated using RAPID software. RADIATION DOSE REDUCTION: This exam was performed according to the departmental dose-optimization program which includes automated exposure control, adjustment of the mA and/or kV according to patient size and/or use of iterative reconstruction technique. CONTRAST:  OMNIPAQUE IOHEXOL 350 MG/ML SOLN COMPARISON:  MRI/MRA head 07/09/2023. FINDINGS: CTA NECK FINDINGS Aortic arch: Three-vessel arch configuration. Atherosclerotic calcifications of the aortic arch and arch vessel origins. Arch vessel origins are patent. Right carotid system: No evidence of dissection, stenosis (50% or greater) or occlusion. Mild calcified plaque along the right carotid bulb. Left carotid system: Mixed plaque results in severe stenosis of the proximal left cervical ICA (difficult to measure due to blooming artifact and severity of stenosis). Vertebral arteries: Codominant. No evidence of dissection, stenosis (50% or greater) or occlusion. Skeleton: Unremarkable. Other neck: Unremarkable. Upper chest: Small left pleural effusion. Review of the MIP images confirms the above findings CTA HEAD FINDINGS Anterior circulation: Calcified plaque along the carotid siphons without hemodynamically significant stenosis. 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 Unchanged moderate stenosis of the right MCA proximal inferior M2 division. The left MCA and bilateral ACAs are patent proximally without stenosis or aneurysm. Mild paucity of distal right MCA branches along the right frontal operculum. Posterior circulation: Mild luminal irregularity of the basilar artery, likely  atherosclerotic in etiology. The SCAs, AICAs and PICAs are patent proximally. Unchanged moderate stenosis of the bilateral PCA origins. Unchanged severe stenosis of  the right PCA P2 segment. Slight asymmetric paucity of distal right PCA branches. Venous sinuses: As permitted by contrast timing, patent. Anatomic variants: None. Review of the MIP images confirms the above findings CT Brain Perfusion Findings: CBF (<30%) Volume: 0mL Perfusion (Tmax>6.0s) volume: 0mL (post processing identified 9 mm ischemic penumbra in the right frontal operculum, which corresponds to the subacute infarct seen on same day noncontrast head CT). Mismatch Volume: 0mL Infarction Location:Not applicable. IMPRESSION: 1. 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 2. Unchanged moderate stenosis of the right MCA proximal inferior M2 division. 3. Unchanged severe stenosis of the right PCA P2 segment. 4. Severe stenosis of the proximal left cervical ICA. 5. No core infarct on CT perfusion. Automated post processing identified 9 mm ischemic penumbra in the right frontal operculum, which corresponds to the subacute infarct seen on same day noncontrast head CT. Aortic Atherosclerosis (ICD10-I70.0). Code stroke imaging results were communicated on 07/17/2023 at 9:36 am to provider Dr. Selina Cooley via secure text paging. Electronically Signed   By: Orvan Falconer M.D.   On: 07/17/2023 09:42   CT HEAD CODE STROKE WO CONTRAST`  Result Date: 07/17/2023 CLINICAL DATA:  Code stroke. Neuro deficit, acute, stroke suspected L sided weakness. EXAM: CT HEAD WITHOUT CONTRAST TECHNIQUE: Contiguous axial images were obtained from the base of the skull through the vertex without intravenous contrast. RADIATION DOSE REDUCTION: This exam was performed according to the departmental dose-optimization program which includes automated exposure control, adjustment of the mA and/or kV according to patient size and/or use of iterative reconstruction technique. COMPARISON:  Head CT 07/08/2023.  MRI/MRA brain 07/09/2023. FINDINGS: Brain: No acute hemorrhage. Evolving, now late subacute infarct in the right frontal  operculum. No new loss of gray-Tomaselli differentiation. No hydrocephalus or extra-axial collection. No mass effect or midline shift. Vascular: No hyperdense vessel or unexpected calcification. Skull: No calvarial fracture or suspicious bone lesion. Skull base is unremarkable. Sinuses/Orbits: No acute finding. Other: None. ASPECTS [facility] Stroke Program Early CT Score) - Ganglionic level infarction (caudate, lentiform nuclei, internal capsule, insula, M1-M3 cortex): 7 - Supraganglionic infarction (M4-M6 cortex): 3 Total score (0-10 with 10 being normal): 10 IMPRESSION: 1. No acute intracranial hemorrhage or evidence of new acute large vessel territory infarct. ASPECT score is 10. 2. 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 Code stroke imaging results were communicated on 07/17/2023 at 8:51 am to provider Dr. Selina Cooley via secure text paging. Electronically Signed   By: Orvan Falconer M.D.   On: 07/17/2023 08:53   CT Angio Abd/Pel w/ and/or w/o  Result Date: 07/15/2023 CLINICAL DATA:  Postmenopausal bleeding EXAM: CTA ABDOMEN AND PELVIS WITHOUT AND WITH CONTRAST TECHNIQUE: Multidetector CT imaging of the abdomen and pelvis was performed using the standard protocol during bolus administration of intravenous contrast. Multiplanar reconstructed images and MIPs were obtained and reviewed to evaluate the vascular anatomy. RADIATION DOSE REDUCTION: This exam was performed according to the departmental dose-optimization program which includes automated exposure control, adjustment of the mA and/or kV according to patient size and/or use of iterative reconstruction technique. CONTRAST:  75mL OMNIPAQUE IOHEXOL 350 MG/ML SOLN COMPARISON:  None Available. FINDINGS: VASCULAR Aorta: No aneurysm or dissection. Extensive atherosclerotic calcification. Celiac: Patent without evidence of aneurysm, dissection, vasculitis or significant stenosis. SMA: Patent without evidence of aneurysm, dissection, vasculitis  or significant stenosis. Renals: Both  renal arteries are patent without evidence of aneurysm, dissection, vasculitis, fibromuscular dysplasia or significant stenosis. IMA: Patent without evidence of aneurysm, dissection, vasculitis or significant stenosis. Inflow: Patent without evidence of aneurysm, dissection, vasculitis or significant stenosis. Proximal Outflow: Bilateral common femoral and visualized portions of the superficial and profunda femoral arteries are patent without evidence of aneurysm, dissection, vasculitis or significant stenosis. Veins: Filling defect within the left common femoral vein at axial image # 68/15 is in keeping with a focal deep venous thrombus. A filling defect is also incidentally noted within the posterior segmental pulmonary artery of the right lower lobe the superior margin of the examination (image # 1/12) in keeping with an acute pulmonary embolus. There is marked mass effect upon the right external iliac vein by the enlarged uterus, best seen on image # 859/15. Review of the MIP images confirms the above findings. NON-VASCULAR Lower chest: As noted above, a acute pulmonary embolus partially visualized within the right lower lobe. Cardiac size is mildly enlarged. Hepatobiliary: No focal liver abnormality is seen. No gallstones, gallbladder wall thickening, or biliary dilatation. Pancreas: Unremarkable Spleen: Unremarkable Adrenals/Urinary Tract: The adrenal glands are unremarkable. The kidneys are normal. Foley catheter balloon is seen looped within the bladder lumen which is partially decompressed. Stomach/Bowel: Stomach is within normal limits. Appendix appears normal. No evidence of bowel wall thickening, distention, or inflammatory changes. Lymphatic: No pathologic adenopathy within the abdomen and pelvis. Reproductive: The uterus is enlarged and lobulated with multiple heterogeneously hypoenhancing masses likely representing multiple uterine fibroids. The endometrial cavity  appears distended and there is heterogeneous intraluminal contents, best seen on sagittal image # 82/11 which may represent blood product or soft tissue as could be seen endometrial carcinoma. This hyperdense material appears to extend into the endocervical canal, but is not well assessed on this examination. No adnexal masses are seen. Other: Tiny fat containing umbilical hernia. Musculoskeletal: No acute bone abnormality. No lytic or blastic bone lesion. IMPRESSION: 1. Acute pulmonary embolus within the posterior segmental pulmonary artery of the right lower lobe. 2. Focal deep venous thrombus within the left common femoral vein. 3. Marked mass effect upon the right external iliac vein by the enlarged uterus. 4. Enlarged, lobulated uterus with multiple heterogeneously hypoenhancing masses likely representing multiple uterine fibroids. 5. Distended endometrial cavity with heterogeneous intraluminal contents which may represent blood product or soft tissue as could be seen endometrial carcinoma. This hyperdense material appears to extend into the endocervical canal, but is not well assessed on this examination. Correlation with dedicated pelvic sonography may be helpful for further evaluation. Aortic Atherosclerosis (ICD10-I70.0). Electronically Signed   By: Helyn Numbers M.D.   On: 07/15/2023 20:12   US Carotid Bilateral  Result Date: 07/12/2023 CLINICAL DATA:  Stroke.  History of hypertension and hyperlipidemia. EXAM: BILATERAL CAROTID DUPLEX ULTRASOUND TECHNIQUE: Wallace Cullens scale imaging, color Doppler and duplex ultrasound were performed of bilateral carotid and vertebral arteries in the neck. COMPARISON:  None Available. FINDINGS: Criteria: Quantification of carotid stenosis is based on velocity parameters that correlate the residual internal carotid diameter with NASCET-based stenosis levels, using the diameter of the distal internal carotid lumen as the denominator for stenosis measurement. The following  velocity measurements were obtained: RIGHT ICA: 103/17 cm/sec CCA: 95/8 cm/sec SYSTOLIC ICA/CCA RATIO:  1.1 ECA: 159 cm/sec LEFT ICA: 147/42 cm/sec CCA: 116/14 cm/sec SYSTOLIC ICA/CCA RATIO:  0.3 ECA: 105 cm/sec RIGHT CAROTID ARTERY: There is a moderate amount of eccentric echogenic plaque within the distal aspect of the right common carotid  artery (image 11). There is a moderate amount of eccentric echogenic plaque within the right carotid bulb (images 14 and 15), extending to involve the origin and proximal aspects of the right internal carotid artery (image 22), not resulting in elevated peak systolic velocities within the interrogated course of the right internal carotid artery to suggest a hemodynamically significant stenosis. RIGHT VERTEBRAL ARTERY:  Antegrade flow LEFT CAROTID ARTERY: There is a moderate amount of eccentric echogenic partially shadowing plaque within left carotid bulb (images 45 and 46), extending to involve the origin and proximal aspects of the right internal carotid artery (image 53), which results in elevated peak systolic velocities within the proximal and mid aspects of the left internal carotid artery. Greatest acquired peak systolic velocity within the proximal left ICA measures 147 centimeters/second (image 55). LEFT VERTEBRAL ARTERY:  Antegrade flow IMPRESSION: 1. Moderate amount of left-sided atherosclerotic plaque results in elevated peak systolic velocities of the left internal carotid artery compatible with the 50-69% luminal narrowing range. Further evaluation with CTA could performed as indicated. 2. Moderate amount of right-sided atherosclerotic plaque, not definitively resulting in a hemodynamically significant stenosis. Electronically Signed   By: Simonne Come M.D.   On: 07/12/2023 13:04   ECHOCARDIOGRAM COMPLETE  Result Date: 07/09/2023    ECHOCARDIOGRAM REPORT   Patient Name:   [facility] Date of Exam: 07/09/2023 Medical Rec #:  [id number]          Height:        64.0 in Accession #:    [id number]         Weight:       128.0 lb Date of Birth:  23-Nov-1930          BSA:          1.618 m Patient Age:    89 years           BP:           101/62 mmHg Patient Gender: F                  HR:           119 bpm. Exam Location:  [facility] Procedure: 2D Echo, Cardiac Doppler and Color Doppler Indications:     Stroke  History:         Patient has no prior history of Echocardiogram examinations.                  Stroke, Arrythmias:Atrial Fibrillation, Signs/Symptoms:Dyspnea                  and Fatigue; Risk Factors:Hypertension and Dyslipidemia.  Sonographer:     Mikki Harbor Referring Phys:  [id number] Andris Baumann Diagnosing Phys: [name] MD IMPRESSIONS  1. Left ventricular ejection fraction, by estimation, is 55 to 60%. The left ventricle has normal function. The left ventricle has no regional wall motion abnormalities. Left ventricular diastolic function could not be evaluated.  2. Right ventricular systolic function is normal. The right ventricular size is normal. There is moderately elevated pulmonary artery systolic pressure.  3. Left atrial size was mildly dilated.  4. The mitral valve is degenerative. Mild to moderate mitral valve regurgitation.  5. Tricuspid valve regurgitation is moderate.  6. The aortic valve is normal in structure. Aortic valve regurgitation is trivial. FINDINGS  Left Ventricle: Left ventricular ejection fraction, by estimation, is 55 to 60%. The left ventricle has normal function. The left ventricle has no regional wall motion abnormalities.  The left ventricular internal cavity size was normal in size. There is  no left ventricular hypertrophy. Left ventricular diastolic function could not be evaluated. Right Ventricle: The right ventricular size is normal. No increase in right ventricular wall thickness. Right ventricular systolic function is normal. There is moderately elevated pulmonary artery systolic pressure. The tricuspid regurgitant velocity  is 3.32 m/s, and with an assumed right atrial pressure of 8 mmHg, the estimated right ventricular systolic pressure is 52.1 mmHg. Left Atrium: Left atrial size was mildly dilated. Right Atrium: Right atrial size was normal in size. Pericardium: There is no evidence of pericardial effusion. Mitral Valve: Eccentric posterior jet. The mitral valve is degenerative in appearance. Mild to moderate mitral valve regurgitation. MV peak gradient, 4.0 mmHg. The mean mitral valve gradient is 2.0 mmHg. Tricuspid Valve: Eccentric posterior jet. The tricuspid valve is grossly normal. Tricuspid valve regurgitation is moderate. Aortic Valve: The aortic valve is normal in structure. Aortic valve regurgitation is trivial. Aortic valve mean gradient measures 5.7 mmHg. Aortic valve peak gradient measures 11.9 mmHg. Aortic valve area, by VTI measures 1.57 cm. Pulmonic Valve: The pulmonic valve was normal in structure. Pulmonic valve regurgitation is not visualized. Aorta: The ascending aorta was not well visualized. IAS/Shunts: No atrial level shunt detected by color flow Doppler.  LEFT VENTRICLE PLAX 2D LVIDd:         4.40 cm LVIDs:         3.10 cm LV PW:         1.10 cm LV IVS:        0.80 cm LVOT diam:     2.00 cm LV SV:         50 LV SV Index:   31 LVOT Area:     3.14 cm  RIGHT VENTRICLE RV Basal diam:  3.60 cm RV Mid diam:    2.70 cm RV S prime:     13.40 cm/s LEFT ATRIUM             Index        RIGHT ATRIUM           Index LA diam:        4.30 cm 2.66 cm/m   RA Area:     22.30 cm LA Vol (A2C):   93.7 ml 57.90 ml/m  RA Volume:   64.70 ml  39.98 ml/m LA Vol (A4C):   78.7 ml 48.63 ml/m LA Biplane Vol: 85.8 ml 53.02 ml/m  AORTIC VALVE                     PULMONIC VALVE AV Area (Vmax):    1.64 cm      PV Vmax:       1.32 m/s AV Area (Vmean):   1.44 cm      PV Peak grad:  7.0 mmHg AV Area (VTI):     1.57 cm AV Vmax:           172.67 cm/s AV Vmean:          110.000 cm/s AV VTI:            0.319 m AV Peak Grad:      11.9 mmHg  AV Mean Grad:      5.7 mmHg LVOT Vmax:         90.20 cm/s LVOT Vmean:        50.450 cm/s LVOT VTI:          0.159 m LVOT/AV VTI  ratio: 0.50  AORTA Ao Root diam: 3.00 cm MITRAL VALVE               TRICUSPID VALVE MV Area (PHT): 5.62 cm    TR Peak grad:   44.1 mmHg MV Area VTI:   2.93 cm    TR Vmax:        332.00 cm/s MV Peak grad:  4.0 mmHg MV Mean grad:  2.0 mmHg    SHUNTS MV Vmax:       1.00 m/s    Systemic VTI:  0.16 m MV Vmean:      58.3 cm/s   Systemic Diam: 2.00 cm MV Decel Time: 135 msec MV E velocity: 91.70 cm/s [name] MD Electronically signed by [name] MD Signature Date/Time: 07/09/2023/1:15:37 PM    Final    MR BRAIN WO CONTRAST  Addendum Date: 07/09/2023   ADDENDUM REPORT: 07/09/2023 03:11 ADDENDUM: In addition to the initially described findings, note is made of a 9 mm T1 hypointense lesion within the C4 vertebral body, indeterminate. This is described in the body of the report, omitted from the impression by mistake. Further evaluation with dedicated MRI of the cervical spine, with and without contrast, suggested for further evaluation as warranted. Electronically Signed   By: Rise Mu M.D.   On: 07/09/2023 03:11   Result Date: 07/09/2023 CLINICAL DATA:  Initial evaluation for neuro deficit, stroke suspected. Left-sided deficit. EXAM: MRI HEAD WITHOUT CONTRAST MRA HEAD WITHOUT CONTRAST TECHNIQUE: Multiplanar, multi-echo pulse sequences of the brain and surrounding structures were acquired without intravenous contrast. Angiographic images of the Circle of Willis were acquired using MRA technique without intravenous contrast. COMPARISON:  Comparison made with prior CT from 07/08/2023. FINDINGS: MRI HEAD FINDINGS Brain: Cerebral volume within normal limits. Minimal hazy FLAIR signal abnormality noted involving the periventricular [place] matter, likely related to chronic microvascular ischemic disease, minimal in nature and less than is typically seen for age. Tiny remote  infarct present at the right occipital lobe (series 20, image 21). Additional tiny remote left cerebellar infarct noted. Subtle focus of diffusion signal abnormality measuring approximately 1.5 cm seen at the right frontal corona radiata (series 10, image 78). Associated subtle signal loss on ADC map (series 11, image 30). Findings suspicious for a possible acute ischemic infarct. No associated hemorrhage or mass effect. No other evidence for acute or subacute ischemia. Gray-Square matter differentiation otherwise maintained. No areas of chronic cortical infarction. No significant acute or chronic intracranial blood products. No mass lesion, midline shift or mass effect. No hydrocephalus or extra-axial fluid collection. Pituitary gland and suprasellar region within normal limits. Vascular: Major intracranial vascular flow voids are maintained. Skull and upper cervical spine: Craniocervical junction within normal limits. 9 mm T1 hypointense lesion noted within the visualized C4 vertebral body (series 14, image 12), indeterminate. Bone marrow signal intensity otherwise normal. No scalp soft tissue abnormality. Sinuses/Orbits: Prior ocular lens replacement on the right. Globes and orbital soft tissues otherwise unremarkable. Paranasal sinuses are largely clear. No significant mastoid effusion. Other: None. MRA HEAD FINDINGS Anterior circulation: Visualized distal cervical segments of the internal carotid arteries are mildly tortuous but patent with antegrade flow. Petrous, cavernous, and supraclinoid segments patent. Left A1 segment widely patent. Focal severe right A1 stenosis (series 40981, image 8). Normal anterior communicating artery complex. Anterior cerebral arteries patent without stenosis. No M1 stenosis or occlusion. Left MCA branches well perfused. On the right, there is acute occlusion of a proximal right M2 branch, superior  division (series 60630, image 13). Inferior division remains patent and perfused,  although a probable moderate proximal right M2 stenosis noted (series 16010, image 12). Posterior circulation: Both vertebral arteries are patent to the vertebrobasilar junction without visible stenosis. Right PICA patent. Left PICA not well seen. Basilar patent without stenosis. Superior cerebral arteries patent bilaterally. Both PCAs primarily supplied via the basilar. Focal moderate left P2 stenosis (series 1021, image 2). Left PCA otherwise patent. On the right, there is a severe distal right P2 stenosis (series 1021, image 7). Right PCA is markedly attenuated distally and only faintly visible on 3D time-of-flight sequence. Anatomic variants: None significant.  No aneurysm. IMPRESSION: MRI HEAD IMPRESSION: 1. Subtle 1.5 cm focus of diffusion signal abnormality involving the right frontal corona radiata, suspicious for an acute ischemic infarct. No associated hemorrhage or mass effect. 2. 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 MRA HEAD IMPRESSION: 1. Acute occlusion of a proximal right M2 branch, superior division. 2. Additional moderate proximal right M2 stenosis, inferior division. 3. Severe distal right P2 stenosis, with additional moderate left P2 stenosis. 4. Focal severe right A1 stenosis. Electronically Signed: By: Rise Mu M.D. On: 07/09/2023 03:05   MR ANGIO HEAD WO CONTRAST  Addendum Date: 07/09/2023   ADDENDUM REPORT: 07/09/2023 03:11 ADDENDUM: In addition to the initially described findings, note is made of a 9 mm T1 hypointense lesion within the C4 vertebral body, indeterminate. This is described in the body of the report, omitted from the impression by mistake. Further evaluation with dedicated MRI of the cervical spine, with and without contrast, suggested for further evaluation as warranted. Electronically Signed   By: Rise Mu M.D.   On: 07/09/2023 03:11   Result Date: 07/09/2023 CLINICAL  DATA:  Initial evaluation for neuro deficit, stroke suspected. Left-sided deficit. EXAM: MRI HEAD WITHOUT CONTRAST MRA HEAD WITHOUT CONTRAST TECHNIQUE: Multiplanar, multi-echo pulse sequences of the brain and surrounding structures were acquired without intravenous contrast. Angiographic images of the Circle of Willis were acquired using MRA technique without intravenous contrast. COMPARISON:  Comparison made with prior CT from 07/08/2023. FINDINGS: MRI HEAD FINDINGS Brain: Cerebral volume within normal limits. Minimal hazy FLAIR signal abnormality noted involving the periventricular [place] matter, likely related to chronic microvascular ischemic disease, minimal in nature and less than is typically seen for age. Tiny remote infarct present at the right occipital lobe (series 20, image 21). Additional tiny remote left cerebellar infarct noted. Subtle focus of diffusion signal abnormality measuring approximately 1.5 cm seen at the right frontal corona radiata (series 10, image 78). Associated subtle signal loss on ADC map (series 11, image 30). Findings suspicious for a possible acute ischemic infarct. No associated hemorrhage or mass effect. No other evidence for acute or subacute ischemia. Gray-Mitch matter differentiation otherwise maintained. No areas of chronic cortical infarction. No significant acute or chronic intracranial blood products. No mass lesion, midline shift or mass effect. No hydrocephalus or extra-axial fluid collection. Pituitary gland and suprasellar region within normal limits. Vascular: Major intracranial vascular flow voids are maintained. Skull and upper cervical spine: Craniocervical junction within normal limits. 9 mm T1 hypointense lesion noted within the visualized C4 vertebral body (series 14, image 12), indeterminate. Bone marrow signal intensity otherwise normal. No scalp soft tissue abnormality. Sinuses/Orbits: Prior ocular lens replacement on the right. Globes and orbital soft  tissues otherwise unremarkable. Paranasal sinuses are largely clear. No significant mastoid effusion. Other: None. MRA HEAD FINDINGS Anterior circulation: Visualized distal  cervical segments of the internal carotid arteries are mildly tortuous but patent with antegrade flow. Petrous, cavernous, and supraclinoid segments patent. Left A1 segment widely patent. Focal severe right A1 stenosis (series 54098, image 8). Normal anterior communicating artery complex. Anterior cerebral arteries patent without stenosis. No M1 stenosis or occlusion. Left MCA branches well perfused. On the right, there is acute occlusion of a proximal right M2 branch, superior division (series 11914, image 13). Inferior division remains patent and perfused, although a probable moderate proximal right M2 stenosis noted (series 78295, image 12). Posterior circulation: Both vertebral arteries are patent to the vertebrobasilar junction without visible stenosis. Right PICA patent. Left PICA not well seen. Basilar patent without stenosis. Superior cerebral arteries patent bilaterally. Both PCAs primarily supplied via the basilar. Focal moderate left P2 stenosis (series 1021, image 2). Left PCA otherwise patent. On the right, there is a severe distal right P2 stenosis (series 1021, image 7). Right PCA is markedly attenuated distally and only faintly visible on 3D time-of-flight sequence. Anatomic variants: None significant.  No aneurysm. IMPRESSION: MRI HEAD IMPRESSION: 1. Subtle 1.5 cm focus of diffusion signal abnormality involving the right frontal corona radiata, suspicious for an acute ischemic infarct. No associated hemorrhage or mass effect. 2. 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 MRA HEAD IMPRESSION: 1. Acute occlusion of a proximal right M2 branch, superior division. 2. Additional moderate proximal right M2 stenosis, inferior division. 3. Severe distal right P2  stenosis, with additional moderate left P2 stenosis. 4. Focal severe right A1 stenosis. Electronically Signed: By: Rise Mu M.D. On: 07/09/2023 03:05   US PELVIC COMPLETE WITH TRANSVAGINAL  Result Date: 07/08/2023 CLINICAL DATA:  Vaginal bleeding in a 89 year old female EXAM: TRANSABDOMINAL ULTRASOUND OF PELVIS TECHNIQUE: Transabdominal ultrasound examination of the pelvis was performed including evaluation of the uterus, ovaries, adnexal regions, and pelvic cul-de-sac. Patient declined transvaginal exam. COMPARISON:  None Available. FINDINGS: Uterus Measurements: 50.3 x 6.6 x 8.6 cm = volume: 452 mL. Enlarged heterogenous uterus with multiple fibroids measuring up to 7.8 cm. Endometrium Thickness: 24 mm. Thickened heterogenous endometrium without definite focal lesion. Right ovary Not visualized due to enlarged heterogenous uterus and overlying bowel. Left ovary Not visualized due to enlarged heterogenous uterus and overlying. Other findings:  No abnormal free fluid. IMPRESSION: 1. Thickened heterogenous endometrium measuring up to 24 mm. In the setting of post-menopausal bleeding, endometrial sampling is indicated to exclude carcinoma. If results are benign, sonohysterogram should be considered for focal lesion work-up. (Ref: Radiological Reasoning: Algorithmic Workup of Abnormal Vaginal Bleeding with Endovaginal Sonography and Sonohysterography. AJR 2008; 621:H08-65) 2. Enlarged heterogenous uterus with multiple fibroids. 3. Nonvisualization of the ovaries. Electronically Signed   By: Minerva Fester M.D.   On: 07/08/2023 20:49   CT HEAD CODE STROKE WO CONTRAST  Result Date: 07/08/2023 CLINICAL DATA:  Code stroke.  Neuro deficit, acute, stroke suspected EXAM: CT HEAD WITHOUT CONTRAST TECHNIQUE: Contiguous axial images were obtained from the base of the skull through the vertex without intravenous contrast. RADIATION DOSE REDUCTION: This exam was performed according to the departmental  dose-optimization program which includes automated exposure control, adjustment of the mA and/or kV according to patient size and/or use of iterative reconstruction technique. COMPARISON:  CT head 07/21/2017. FINDINGS: Brain: No evidence of acute infarction, hemorrhage, hydrocephalus, extra-axial collection or mass lesion/mass effect. Vascular: No hyperdense vessel or unexpected calcification. Skull: Normal. Negative for fracture or focal lesion. Sinuses/Orbits: No acute finding. Other: None. ASPECTS [facility]  Stroke Program Early CT Score) Total score (0-10 with 10 being normal): 10. IMPRESSION: 1. No evidence of acute intracranial abnormality. 2. ASPECTS is 10. Code stroke imaging results were communicated on 07/08/2023 at 8:12 pm to provider Chesley Noon via telephone, who verbally acknowledged these results. Electronically Signed   By: Feliberto Harts M.D.   On: 07/08/2023 20:13     Assessment   Marissa Gilmore is a 89 y.o. female admitted for multiple medical problems including vaginal bleeding. S/p bilateral ovarian artery embolization on 07/16/23 with cessation of vaginal bleeding. She has elected for comfort care and will be going to hospice.   Plan   1. PMB , fibroid UTX  - No vaginal bleeding - Hb >>7.9>9.7>8.5 this AM - GYN will sign off as patient has elected for comfort care and will be moving to hospice.    Romana Juniper, MD

## 2023-07-18 NOTE — Progress Notes (Signed)
SLP Cancellation Note  Patient Details Name: Marissa Gilmore MRN: [id number] DOB: 1931-10-23   Cancelled treatment:       Reason Eval/Treat Not Completed:  (chart reviewed)  Per chart/MD notes, pt's overall clinical condition has worsened and pt has elected to transition to Comfort Care status. She is discharging to [facility] this evening.  No further skilled ST services indicated. MD to reconsult if new needs while admitted.     Jerilynn Som, MS, CCC-SLP Speech Language Pathologist Rehab Services; [facility] [phone] (ascom) Deirdra Heumann 07/18/2023, 5:16 PM

## 2023-07-18 NOTE — Progress Notes (Signed)
OT Cancellation Note  Patient Details Name: Janifer Scantlebury MRN: [id number] DOB: [birth year]   Cancelled Treatment:    Reason Eval/Treat Not Completed: Other (comment). Per chart review, pt has transitioned to comfort care. OT to complete orders at this time.   Jackquline Denmark, MS, OTR/L , CBIS ascom [phone]  07/18/23, 10:03 AM

## 2023-08-05 DEATH — deceased

## 2023-10-13 ENCOUNTER — Encounter (INDEPENDENT_AMBULATORY_CARE_PROVIDER_SITE_OTHER): Payer: Medicare Other

## 2023-10-13 ENCOUNTER — Ambulatory Visit (INDEPENDENT_AMBULATORY_CARE_PROVIDER_SITE_OTHER): Payer: Medicare Other | Admitting: Vascular Surgery

## 2024-02-09 ENCOUNTER — Encounter: Payer: Medicare Other | Admitting: Family Medicine
